# Patient Record
Sex: Female | Born: 1950
Health system: Southern US, Community
[De-identification: ages and names within clinical notes are randomized; demographics above are authoritative.]

## PROBLEM LIST (undated history)

## (undated) DIAGNOSIS — E7800 Pure hypercholesterolemia, unspecified: Secondary | ICD-10-CM

## (undated) DIAGNOSIS — I10 Essential (primary) hypertension: Secondary | ICD-10-CM

## (undated) DIAGNOSIS — M1990 Unspecified osteoarthritis, unspecified site: Secondary | ICD-10-CM

## (undated) DIAGNOSIS — R011 Cardiac murmur, unspecified: Secondary | ICD-10-CM

## (undated) HISTORY — DX: Cardiac murmur, unspecified: R01.1

## (undated) HISTORY — PX: TUBAL LIGATION: SHX77

## (undated) HISTORY — DX: Essential (primary) hypertension: I10

## (undated) HISTORY — PX: CARPAL TUNNEL RELEASE: SHX101

---

## 2011-03-20 ENCOUNTER — Ambulatory Visit (INDEPENDENT_AMBULATORY_CARE_PROVIDER_SITE_OTHER): Payer: BC Managed Care – PPO | Admitting: Family Medicine

## 2011-03-20 ENCOUNTER — Encounter: Payer: Self-pay | Admitting: Family Medicine

## 2011-03-20 VITALS — BP 170/96 | HR 73 | Resp 16 | Ht 66.0 in | Wt 202.1 lb

## 2011-03-20 DIAGNOSIS — N938 Other specified abnormal uterine and vaginal bleeding: Secondary | ICD-10-CM

## 2011-03-20 DIAGNOSIS — N939 Abnormal uterine and vaginal bleeding, unspecified: Secondary | ICD-10-CM

## 2011-03-20 DIAGNOSIS — I10 Essential (primary) hypertension: Secondary | ICD-10-CM

## 2011-03-20 DIAGNOSIS — N926 Irregular menstruation, unspecified: Secondary | ICD-10-CM

## 2011-03-20 DIAGNOSIS — E669 Obesity, unspecified: Secondary | ICD-10-CM

## 2011-03-20 MED ORDER — HYDROCHLOROTHIAZIDE 25 MG PO TABS: 25.0000 mg | ORAL_TABLET | Freq: Every day | ORAL | Status: DC

## 2011-03-20 NOTE — Patient Instructions (Signed)
It was nice to meet you! Start the blood pressure pill, take once a day  Get the blood work done- do not eat after midnight- we will go over the blood work at your physical Schedule a physical within the next 4 weeks- please give her 8:30 am slot.

## 2011-03-20 NOTE — Assessment & Plan Note (Addendum)
Start HCTZ daily. Obtain metabolic panel, lipid panel, CBC, TSH

## 2011-03-20 NOTE — Assessment & Plan Note (Signed)
Will have patient schedule physical for Pap smear. Cultures were done at this visit as well. Ultrasound will be obtained she will be referred to GYN for further workup for postmenopausal bleeding

## 2011-03-20 NOTE — Progress Notes (Signed)
  Subjective:    Patient ID: Robin Villa, female    DOB: 04-21-1950, 61 y.o.   MRN: [id number]  HPI  Patient here to establish care. She has not seen a primary care provider in greater than 5 years. Her previous PCP was Dr. Mirna Mires.  Hypertension- patient has a history of hypertension. She was on medications in the past. Has not been on medications for a few years. She takes her blood pressure at home with an automated cuff. Her blood pressure one 130 systolic over 80s diastolic. She denies headache, chest pain. She admits to leg edema typically after work.  Vaginal bleeding- patient is noting spotting for the past 4-5 months. She stopped her menses at age 66 after her last child. She denies any vaginal discharge, pelvic or abdominal pain. She denies dysuria. She's not had a Pap smear in greater than 5 years. She has not had a mammogram  She has not had a colonoscopy She's not had any blood work done.  She works for [employer] system.  Review of Systems    GEN- denies fatigue, fever, weight loss,weakness, recent illness HEENT- denies eye drainage, change in vision, nasal discharge, CVS- denies chest pain, palpitations, +leg swelling  RESP- denies SOB, cough, wheeze ABD- denies N/V, change in stools, abd pain GU- denies dysuria, hematuria, dribbling, incontinence MSK- denies joint pain, muscle aches, injury Neuro- denies headache, dizziness, syncope, seizure activity       Objective:   Physical Exam GEN- NAD, alert and oriented x3 HEENT- PERRL, EOMI, non injected sclera, pink conjunctiva, MMM, oropharynx clear Neck- Supple, no thyromegaly,no bruit CVS- RRR, no murmur RESP-CTAB ABD-NABS,soft, NT ND EXT- Trace pedal edema Pulses- Radial, DP- 2+        Assessment & Plan:

## 2011-03-20 NOTE — Assessment & Plan Note (Signed)
Discussed importance of exercise. We'll continue to counsel patient on this

## 2011-04-04 LAB — COMPREHENSIVE METABOLIC PANEL
AST: 25 U/L (ref 0–37)
Alkaline Phosphatase: 67 U/L (ref 39–117)
Glucose, Bld: 95 mg/dL (ref 70–99)
Potassium: 4.7 mEq/L (ref 3.5–5.3)
Sodium: 141 mEq/L (ref 135–145)
Total Bilirubin: 0.3 mg/dL (ref 0.3–1.2)
Total Protein: 7.4 g/dL (ref 6.0–8.3)

## 2011-04-04 LAB — CBC
Hemoglobin: 12.2 g/dL (ref 12.0–15.0)
MCH: 28 pg (ref 26.0–34.0)
MCHC: 30.6 g/dL (ref 30.0–36.0)
MCV: 91.5 fL (ref 78.0–100.0)
Platelets: 247 10*3/uL (ref 150–400)
RBC: 4.36 MIL/uL (ref 3.87–5.11)

## 2011-04-04 LAB — LIPID PANEL
Total CHOL/HDL Ratio: 5.9 Ratio
VLDL: 24 mg/dL (ref 0–40)

## 2011-04-04 LAB — TSH: TSH: 1.227 u[IU]/mL (ref 0.350–4.500)

## 2011-04-10 ENCOUNTER — Encounter: Payer: Self-pay | Admitting: Family Medicine

## 2011-04-10 ENCOUNTER — Other Ambulatory Visit (HOSPITAL_COMMUNITY)
Admission: RE | Admit: 2011-04-10 | Discharge: 2011-04-10 | Disposition: A | Discharge status: Home / Self Care | Payer: BC Managed Care – PPO | Source: Ambulatory Visit | Attending: Family Medicine | Admitting: Family Medicine

## 2011-04-10 ENCOUNTER — Ambulatory Visit (INDEPENDENT_AMBULATORY_CARE_PROVIDER_SITE_OTHER): Payer: BC Managed Care – PPO | Admitting: Family Medicine

## 2011-04-10 VITALS — BP 150/84 | HR 65 | Resp 18 | Ht 66.0 in | Wt 199.0 lb

## 2011-04-10 DIAGNOSIS — N939 Abnormal uterine and vaginal bleeding, unspecified: Secondary | ICD-10-CM

## 2011-04-10 DIAGNOSIS — N938 Other specified abnormal uterine and vaginal bleeding: Secondary | ICD-10-CM

## 2011-04-10 DIAGNOSIS — Z01419 Encounter for gynecological examination (general) (routine) without abnormal findings: Secondary | ICD-10-CM

## 2011-04-10 DIAGNOSIS — I10 Essential (primary) hypertension: Secondary | ICD-10-CM

## 2011-04-10 DIAGNOSIS — N926 Irregular menstruation, unspecified: Secondary | ICD-10-CM

## 2011-04-10 LAB — POC HEMOCCULT BLD/STL (OFFICE/1-CARD/DIAGNOSTIC): Fecal Occult Blood, POC: NEGATIVE

## 2011-04-10 NOTE — Assessment & Plan Note (Signed)
Will send for work-up of DUB in post menopausal patient Cultures obtained, PAP Smear done Mammogram set up for [facility] Reviewed labs

## 2011-04-10 NOTE — Assessment & Plan Note (Signed)
Blood pressure much improved, will continue HCTZ

## 2011-04-10 NOTE — Progress Notes (Signed)
  Subjective:    Patient ID: Robin Villa, female    DOB: 1950-05-01, 61 y.o.   MRN: [id number]  HPI  Patient here for GYN exam. She continues to have random vaginal spotting. Denies vaginal discharge. She's been postmenopausal for greater than 20 years.   Colonoscopy Due- pt wants to wait until summer TDAP due today Declined flu   Review of Systems      GEN- denies fatigue, fever, weight loss,weakness, recent illness HEENT- denies eye drainage, change in vision, nasal discharge, CVS- denies chest pain, palpitations, +leg swelling  RESP- denies SOB, cough, wheeze ABD- denies N/V, change in stools, abd pain GU- denies dysuria, hematuria, dribbling, incontinence MSK- denies joint pain, muscle aches, injury Neuro- denies headache, dizziness, syncope, seizure activity    Objective:   Physical Exam GEN- NAD, alert and oriented,overweght Breast- normal symmetry, no nipple inversion,no nipple drainage, no nodules or lumps felt Nodes- no axillary nodes GU- normal external genitalia, vaginal mucosa pink and moist, cervix visualized no growth, + blood form os, friable cervix, no  discharge, no CMT, no ovarian masses, uterus normal size Rectal- small skin tag, soft brown stool, normal Tone FOBT- Negative        Assessment & Plan:

## 2011-04-10 NOTE — Patient Instructions (Signed)
You will be referred to GYN for work-up We will call with labs results or send a letter Continue your blood pressure medication I recommend Calcium (1200mg ) and Vit D (800IU) daily 30 minutes of exercise 5 days a week Dentist visit every 6 months Eye doctor visit once a year with high blood pressure F/U 3 months

## 2011-04-11 ENCOUNTER — Telehealth: Payer: Self-pay | Admitting: Family Medicine

## 2011-04-11 LAB — WET PREP BY MOLECULAR PROBE
Candida species: NEGATIVE
Trichomonas vaginosis: POSITIVE — AB

## 2011-04-11 LAB — GC/CHLAMYDIA PROBE AMP, GENITAL: GC Probe Amp, Genital: NEGATIVE

## 2011-04-11 MED ORDER — METRONIDAZOLE 500 MG PO TABS: ORAL_TABLET | ORAL | Status: DC

## 2011-04-11 NOTE — Telephone Encounter (Signed)
LVM Pt  Has trichomonas infection noted on Wet Prep, she is married. She needs to take Flagyl 2000mg  x 1 dose to clear the infection. It is possible the infection has been around for a while, there is no way to tell who she was infected by. Her husband should also be treated. He can go to the health department

## 2011-04-12 NOTE — Telephone Encounter (Signed)
Patient is aware and I mailed her some information on the infection

## 2011-04-14 ENCOUNTER — Ambulatory Visit (HOSPITAL_COMMUNITY): Payer: BC Managed Care – PPO

## 2011-07-11 ENCOUNTER — Ambulatory Visit: Payer: BC Managed Care – PPO | Admitting: Family Medicine

## 2011-07-12 ENCOUNTER — Encounter: Payer: Self-pay | Admitting: Family Medicine

## 2011-07-12 ENCOUNTER — Ambulatory Visit (INDEPENDENT_AMBULATORY_CARE_PROVIDER_SITE_OTHER): Payer: BC Managed Care – PPO | Admitting: Family Medicine

## 2011-07-12 VITALS — BP 160/90 | HR 86 | Resp 16 | Ht 66.0 in | Wt 197.0 lb

## 2011-07-12 DIAGNOSIS — Z1211 Encounter for screening for malignant neoplasm of colon: Secondary | ICD-10-CM

## 2011-07-12 DIAGNOSIS — N925 Other specified irregular menstruation: Secondary | ICD-10-CM

## 2011-07-12 DIAGNOSIS — I10 Essential (primary) hypertension: Secondary | ICD-10-CM

## 2011-07-12 DIAGNOSIS — N938 Other specified abnormal uterine and vaginal bleeding: Secondary | ICD-10-CM

## 2011-07-12 DIAGNOSIS — E669 Obesity, unspecified: Secondary | ICD-10-CM

## 2011-07-12 DIAGNOSIS — N949 Unspecified condition associated with female genital organs and menstrual cycle: Secondary | ICD-10-CM

## 2011-07-12 MED ORDER — LISINOPRIL 20 MG PO TABS: 20.0000 mg | ORAL_TABLET | Freq: Every day | ORAL | Status: DC

## 2011-07-12 NOTE — Progress Notes (Signed)
  Subjective:    Patient ID: Robin Villa, female    DOB: [birth year], 61 y.o.   MRN: [id number]  HPI Pt here to f/u chronic medical problems, no concerns Medication and history reviewed Needs colonoscopy set up No recent vaginal bleeding after trichomonas treatment, ultrasound never done    Review of Systems   GEN- denies fatigue, fever, weight loss,weakness, recent illness HEENT- denies eye drainage, change in vision, nasal discharge, CVS- denies chest pain, palpitations RESP- denies SOB, cough, wheeze ABD- denies N/V, change in stools, abd pain GU- denies dysuria, hematuria, dribbling, incontinence MSK- denies joint pain, muscle aches, injury Neuro- denies headache, dizziness, syncope, seizure activity      Objective:   Physical Exam GEN- NAD, alert and oriented x3 HEENT- PERRL, EOMI, non injected sclera, pink conjunctiva, MMM, oropharynx clear Neck- Supple, no thyrmegaly, no bruit CVS- RRR, no murmur RESP-CTAB ABD-NABS,soft, NT,ND EXT-trace pedal edema Pulses- Radial, DP- 2+        Assessment & Plan:

## 2011-07-12 NOTE — Patient Instructions (Addendum)
We will set up colonoscopy We will set up the ultrasound for the bleeding Start new blood pressure medicine lisinopril - once a day  Continue your HCTZ F/U 4 weeks

## 2011-07-13 ENCOUNTER — Encounter: Payer: Self-pay | Admitting: Family Medicine

## 2011-07-13 ENCOUNTER — Telehealth: Payer: Self-pay

## 2011-07-13 NOTE — Progress Notes (Signed)
Patient ID: Robin Villa, female   DOB: [birth year], 61 y.o.   MRN: [id number] Patient has appt at [facility] [place] on 07/17/2011 9:45. Patient is aware of this. But states she can't do this right now. It will have to wait until she goes back to work. Thanks, [name] My staff his call patient to schedule appointment she spoke with [name] today she did not want to have the ultrasound done she was not working. This is documented and she was advised on the visit that she needs the ultrasound for the postmenopausal bleeding which could be a sign of cancer

## 2011-07-13 NOTE — Assessment & Plan Note (Signed)
encouraged to continue walking and watch her diet

## 2011-07-13 NOTE — Assessment & Plan Note (Signed)
No recent bleeding, treated for STD, concern for bleed in post-menopausal pt therefore I feel ultrasound should be done, pt agrees to this but will hold off on GYN unless abnormal ultrasound

## 2011-07-13 NOTE — Assessment & Plan Note (Addendum)
Uncontrolled add lisinopril, recheck 4 weeks

## 2011-07-13 NOTE — Telephone Encounter (Signed)
Called, line busy.  

## 2011-07-14 NOTE — Telephone Encounter (Signed)
Called. Line busy again. Mailed letter to call.

## 2011-07-17 ENCOUNTER — Ambulatory Visit (HOSPITAL_COMMUNITY): Payer: BC Managed Care – PPO

## 2011-08-08 ENCOUNTER — Telehealth: Payer: Self-pay | Admitting: Urgent Care

## 2011-08-08 NOTE — Telephone Encounter (Signed)
Called pt to triage for colonoscopy. She has problem with hemorrhoids. OV scheduled for 08/23/2011 with KJ at 8:00 AM.

## 2011-08-08 NOTE — Telephone Encounter (Signed)
Pt received a letter from nurse regarding setting up tcs. You can reach her at [phone]

## 2011-08-09 ENCOUNTER — Ambulatory Visit (INDEPENDENT_AMBULATORY_CARE_PROVIDER_SITE_OTHER): Payer: BC Managed Care – PPO | Admitting: Family Medicine

## 2011-08-09 ENCOUNTER — Encounter: Payer: Self-pay | Admitting: Family Medicine

## 2011-08-09 VITALS — BP 138/90 | HR 86 | Resp 16 | Ht 66.0 in | Wt 198.4 lb

## 2011-08-09 DIAGNOSIS — I10 Essential (primary) hypertension: Secondary | ICD-10-CM

## 2011-08-09 MED ORDER — LISINOPRIL-HYDROCHLOROTHIAZIDE 20-25 MG PO TABS: 1.0000 | ORAL_TABLET | Freq: Every day | ORAL | Status: DC

## 2011-08-09 NOTE — Patient Instructions (Signed)
Start the combination blood pressure pill ( HCTZ-Lisinopril)  Pick up Calcium and Vit D -once a day  F/U 3 months

## 2011-08-09 NOTE — Progress Notes (Signed)
  Subjective:    Patient ID: Robin Villa, female    DOB: 02/25/1950, 61 y.o.   MRN: [id number]  HPI Pt here to f/u HTN doing well, had cough for 2 days starting lisinopril now resolved, no concerns Set up to have Mammogram and Colonoscopy Declines vaginal ultrasound    Review of Systems  GEN- denies fatigue, fever, weight loss,weakness, recent illness HEENT- denies eye drainage, change in vision, nasal discharge, CVS- denies chest pain, palpitations RESP- denies SOB, cough, wheeze ABD- denies N/V, change in stools, abd pain GU- denies dysuria, hematuria, dribbling, incontinence MSK- denies joint pain, muscle aches, injury Neuro- denies headache, dizziness, syncope, seizure activity      Objective:   Physical Exam GEN- NAD, alert and oriented x3 CVS- RRR, no murmur RESP-CTAB EXT-Trace  edema Pulses- Radial, DP- 2+        Assessment & Plan:

## 2011-08-09 NOTE — Assessment & Plan Note (Signed)
BP much improved on Lisinopril HCTZ, continue with combination pill

## 2011-08-23 ENCOUNTER — Encounter: Payer: Self-pay | Admitting: Urgent Care

## 2011-08-23 ENCOUNTER — Ambulatory Visit (INDEPENDENT_AMBULATORY_CARE_PROVIDER_SITE_OTHER): Payer: BC Managed Care – PPO | Admitting: Urgent Care

## 2011-08-23 VITALS — BP 122/65 | HR 55 | Temp 97.2°F | Ht 65.0 in | Wt 201.0 lb

## 2011-08-23 DIAGNOSIS — K649 Unspecified hemorrhoids: Secondary | ICD-10-CM

## 2011-08-23 DIAGNOSIS — Z1211 Encounter for screening for malignant neoplasm of colon: Secondary | ICD-10-CM

## 2011-08-23 MED ORDER — HYDROCORTISONE ACETATE 25 MG RE SUPP: 25.0000 mg | Freq: Two times a day (BID) | RECTAL | Status: AC

## 2011-08-23 NOTE — Patient Instructions (Addendum)
Colonoscopy w/ Dr Val Riles [facility] suppositories twice daily for 10 days Hemorrhoids Hemorrhoids are enlarged (dilated) veins around the rectum. There are 2 types of hemorrhoids, and the type of hemorrhoid is determined by its location. Internal hemorrhoids occur in the veins just inside the rectum.They are usually not painful, but they may bleed.However, they may poke through to the outside and become irritated and painful. External hemorrhoids involve the veins outside the anus and can be felt as a painful swelling or hard lump near the anus.They are often itchy and may crack and bleed. Sometimes clots will form in the veins. This makes them swollen and painful. These are called thrombosed hemorrhoids. CAUSES Causes of hemorrhoids include:  Pregnancy. This increases the pressure in the hemorrhoidal veins.   Constipation.   Straining to have a bowel movement.   Obesity.   Heavy lifting or other activity that caused you to strain.  TREATMENT Most of the time hemorrhoids improve in 1 to 2 weeks. However, if symptoms do not seem to be getting better or if you have a lot of rectal bleeding, your caregiver may perform a procedure to help make the hemorrhoids get smaller or remove them completely.Possible treatments include:  Rubber band ligation. A rubber band is placed at the base of the hemorrhoid to cut off the circulation.   Sclerotherapy. A chemical is injected to shrink the hemorrhoid.   Infrared light therapy. Tools are used to burn the hemorrhoid.   Hemorrhoidectomy. This is surgical removal of the hemorrhoid.  HOME CARE INSTRUCTIONS   Increase fiber in your diet. Ask your caregiver about using fiber supplements.   Drink enough water and fluids to keep your urine clear or pale yellow.   Exercise regularly.   Go to the bathroom when you have the urge to have a bowel movement. Do not wait.   Avoid straining to have bowel movements.   Keep the anal area dry and clean.    Only take over-the-counter or prescription medicines for pain, discomfort, or fever as directed by your caregiver.  If your hemorrhoids are thrombosed:  Take warm sitz baths for 20 to 30 minutes, 3 to 4 times per day.   If the hemorrhoids are very tender and swollen, place ice packs on the area as tolerated. Using ice packs between sitz baths may be helpful. Fill a plastic bag with ice. Place a towel between the bag of ice and your skin.   Medicated creams and suppositories may be used or applied as directed.   Do not use a donut-shaped pillow or sit on the toilet for long periods. This increases blood pooling and pain.  SEEK MEDICAL CARE IF:   You have increasing pain and swelling that is not controlled with your medicine.   You have uncontrolled bleeding.   You have difficulty or you are unable to have a bowel movement.   You have pain or inflammation outside the area of the hemorrhoids.   You have chills or an oral temperature above 102 F (38.9 C).  MAKE SURE YOU:   Understand these instructions.   Will watch your condition.   Will get help right away if you are not doing well or get worse.  Document Released: 01/14/2000 Document Revised: 01/05/2011 Document Reviewed: 05/21/2007 [facility] Patient Information 2012 [place], [place].

## 2011-08-23 NOTE — Progress Notes (Signed)
Referring Provider: Salley Scarlet, MD Primary Care Physician:  Milinda Antis, MD Primary Gastroenterologist:  Dr. Jonette Crytal  Chief Complaint  Patient presents with  . Colonoscopy    ?hemorrhoids    HPI:  Robin Villa is a 61 y.o. female here as a referral from Dr. Jeanice Lim for screening colonoscopy.  She was brought in for an office visit prior to her colonoscopy as she has concerns with hemorrhoids.  She has had proctalgia & anal pruritis.   Tried prep H without relief.  Never had colonoscopy.  Denies any lower GI symptoms including constipation, diarrhea, rectal bleeding, melena or weight loss. Denies any upper GI symptoms including heartburn, indigestion, nausea, vomiting, dysphagia, odynophagia or anorexia. Past Medical History  Diagnosis Date  . Hypertension   . Heart murmur     asymptomatic    Past Surgical History  Procedure Date  . Tubal ligation   . Carpal tunnel release     bilateral     Current Outpatient Prescriptions  Medication Sig Dispense Refill  . lisinopril-hydrochlorothiazide (PRINZIDE,ZESTORETIC) 20-25 MG per tablet Take 1 tablet by mouth daily.  30 tablet  3    Allergies as of 08/23/2011  . (No Known Allergies)    Family History:There is no known family history of colorectal carcinoma , liver disease, or inflammatory bowel disease.  Problem Relation Age of Onset  . Heart disease Father   . Stroke Father   . Hypertension Sister   . Hypertension Sister   . Heart disease Brother   . Hypertension Mother     History   Social History  . Marital Status: Unknown    Spouse Name: N/A    Number of Children: 3  . Years of Education: N/A   Occupational History  . retired, Public affairs consultant    Social History Main Topics  . Smoking status: Former Smoker -- 0.3 packs/day for 35 years    Types: Cigarettes    Quit date: 08/22/1996  . Smokeless tobacco: Not on file  . Alcohol Use: No  . Drug Use: No  . Sexually Active: Not on file  Review of  Systems: Gen: Denies any fever, chills, sweats, anorexia, fatigue, weakness, malaise, weight loss, and sleep disorder CV: Denies chest pain, angina, palpitations, syncope, orthopnea, PND, peripheral edema, and claudication. Resp: Denies dyspnea at rest, dyspnea with exercise, cough, sputum, wheezing, coughing up blood, and pleurisy. GI: Denies vomiting blood, jaundice, and fecal incontinence.   Denies dysphagia or odynophagia. GU : Denies urinary burning, blood in urine, urinary frequency, urinary hesitancy, nocturnal urination, and urinary incontinence. MS: Denies joint pain, limitation of movement, and swelling, stiffness, low back pain, extremity pain. Denies muscle weakness, cramps, atrophy.  Derm: Denies rash, itching, dry skin, hives, moles, warts, or unhealing ulcers.  Psych: Denies depression, anxiety, memory loss, suicidal ideation, hallucinations, paranoia, and confusion. Heme: Denies bruising, bleeding, and enlarged lymph nodes. Neuro:  Denies any headaches, dizziness, paresthesias. Endo:  Denies any problems with DM, thyroid, adrenal function.  Physical Exam: BP 122/65  Pulse 55  Temp 97.2 F (36.2 C) (Temporal)  Ht 5\' 5"  (1.651 m)  Wt 201 lb (91.173 kg)  BMI 33.45 kg/m2 General:   Alert,  Well-developed, obese, pleasant and cooperative in NAD Head:  Normocephalic and atraumatic. Eyes:  Sclera clear, no icterus.   Conjunctiva pink. Ears:  Normal auditory acuity. Nose:  No deformity, discharge, or lesions. Mouth:  No deformity or lesions,oropharynx pink & moist. Neck:  Supple; no masses or thyromegaly. Lungs:  Clear throughout to auscultation.   No wheezes, crackles, or rhonchi. No acute distress. Heart:  Regular rate and rhythm; no murmurs, clicks, rubs,  or gallops. Abdomen:  Normal bowel sounds.  No bruits.  Soft, non-tender and non-distended without masses, hepatosplenomegaly or hernias noted.  No guarding or rebound tenderness.   Rectal:  Deferred until  colonoscopy. Msk:  Symmetrical without gross deformities. Normal posture. Pulses:  Normal pulses noted. Extremities:  No clubbing or edema. Neurologic:  Alert and oriented x4;  grossly normal neurologically. Skin:  Intact without significant lesions or rashes. Lymph Nodes:  No significant cervical adenopathy. Psych:  Alert and cooperative. Normal mood and affect.

## 2011-08-23 NOTE — Progress Notes (Signed)
Faxed to PCP, Dr Angier 

## 2011-08-23 NOTE — Assessment & Plan Note (Signed)
Robin Villa is a pleasant 61 y.o. female due for average risk screening colonoscopy who believes she has hemorrhoids due to anal pruritis & proctalgia.  Colonoscopy w/ Dr Darrick Penna for further evaluation.  Symptomatic hemorrhoid treatment.  Hemorrhoid literature Anusol HC supp PR BID

## 2011-09-05 ENCOUNTER — Encounter (HOSPITAL_COMMUNITY): Payer: Self-pay | Admitting: Pharmacy Technician

## 2011-09-15 ENCOUNTER — Encounter (HOSPITAL_COMMUNITY)
Admission: RE | Disposition: A | Discharge status: Home / Self Care | Payer: Self-pay | Source: Ambulatory Visit | Attending: Gastroenterology

## 2011-09-15 ENCOUNTER — Encounter (HOSPITAL_COMMUNITY): Payer: Self-pay

## 2011-09-15 ENCOUNTER — Ambulatory Visit (HOSPITAL_COMMUNITY)
Admission: RE | Admit: 2011-09-15 | Discharge: 2011-09-15 | Disposition: A | Discharge status: Home / Self Care | Payer: BC Managed Care – PPO | Source: Ambulatory Visit | Attending: Gastroenterology | Admitting: Gastroenterology

## 2011-09-15 DIAGNOSIS — Z1211 Encounter for screening for malignant neoplasm of colon: Secondary | ICD-10-CM

## 2011-09-15 DIAGNOSIS — K649 Unspecified hemorrhoids: Secondary | ICD-10-CM

## 2011-09-15 DIAGNOSIS — K5730 Diverticulosis of large intestine without perforation or abscess without bleeding: Secondary | ICD-10-CM

## 2011-09-15 DIAGNOSIS — Z79899 Other long term (current) drug therapy: Secondary | ICD-10-CM | POA: Insufficient documentation

## 2011-09-15 DIAGNOSIS — I10 Essential (primary) hypertension: Secondary | ICD-10-CM | POA: Insufficient documentation

## 2011-09-15 DIAGNOSIS — D126 Benign neoplasm of colon, unspecified: Secondary | ICD-10-CM | POA: Insufficient documentation

## 2011-09-15 DIAGNOSIS — K648 Other hemorrhoids: Secondary | ICD-10-CM

## 2011-09-15 HISTORY — PX: COLONOSCOPY: SHX5424

## 2011-09-15 SURGERY — COLONOSCOPY
Anesthesia: Moderate Sedation

## 2011-09-15 MED ORDER — STERILE WATER FOR IRRIGATION IR SOLN
Status: DC | PRN
  Administered 2011-09-15: 11:00:00

## 2011-09-15 MED ORDER — MIDAZOLAM HCL 5 MG/5ML IJ SOLN
INTRAMUSCULAR | Status: AC
  Filled 2011-09-15: qty 10

## 2011-09-15 MED ORDER — MIDAZOLAM HCL 5 MG/5ML IJ SOLN
INTRAMUSCULAR | Status: DC | PRN
  Administered 2011-09-15 (×2): 2 mg via INTRAVENOUS

## 2011-09-15 MED ORDER — SODIUM CHLORIDE 0.45 % IV SOLN
Freq: Once | INTRAVENOUS | Status: AC
  Administered 2011-09-15: 09:00:00 via INTRAVENOUS

## 2011-09-15 MED ORDER — MEPERIDINE HCL 100 MG/ML IJ SOLN
INTRAMUSCULAR | Status: DC | PRN
  Administered 2011-09-15: 25 mg via INTRAVENOUS
  Administered 2011-09-15: 50 mg via INTRAVENOUS

## 2011-09-15 MED ORDER — MEPERIDINE HCL 100 MG/ML IJ SOLN
INTRAMUSCULAR | Status: AC
  Filled 2011-09-15: qty 1

## 2011-09-15 NOTE — Op Note (Signed)
[facility] [address] [place], [place]  [address]  COLONOSCOPY PROCEDURE REPORT  PATIENT:  Robin Villa, Robin Villa  MR#:  [id number] BIRTHDATE:  08/22/1950, 61 yrs. old  GENDER:  female  ENDOSCOPIST:  Jonette Brei, MD REF. BY:  Milinda Antis, M.D. ASSISTANT:  PROCEDURE DATE:  09/15/2011 PROCEDURE:  Colonoscopy with biopsy and snare polypectomy  INDICATIONS:  screening  MEDICATIONS:   Demerol 75 mg IV, Versed 4 mg IV  DESCRIPTION OF PROCEDURE:    Physical exam was performed. Informed consent was obtained from the patient after explaining the benefits, risks, and alternatives to procedure.  The patient was connected to monitor and placed in left lateral position. Continuous oxygen was provided by nasal cannula and IV medicine administered through an indwelling cannula.  After administration of sedation and rectal exam, the patient's rectum was intubated and the EC-[id number] ([id number]) colonoscope was advanced under direct visualization to the cecum.  The scope was removed slowly by carefully examining the color, texture, anatomy, and integrity mucosa on the way out.  The patient was recovered in endoscopy and discharged home in satisfactory condition. <<PROCEDUREIMAGES>>  FINDINGS:  There were TWO 3 MM SESSILE polyps identified and removed. in the ascending colon.  There were TWO 3 MM SESSILE polyps identified and removed. in the descending colon.  There were TWO 3-8 MM SESSILE polyps identified and removed. in the sigmoid colon(CF/Flippin). ALL POLYPS REMOVED VIA COLD FORCEPS/SNARE CAUTERY. FREQUENT Diverticula were found throughout the colon.  MODERATE Internal Hemorrhoids were found.  PREP QUALITY:      GOOD CECAL W/D TIME:      26 minutes  COMPLICATIONS:    None  ENDOSCOPIC IMPRESSION: 1) Polyps, multiple in the ascending colon 2) Polyps, multiple in the descending colon 3) Polyps, multiple in the sigmoid colon 4) MODERATE DiverticulOSIS throughout the colon 5) Internal  hemorrhoids  RECOMMENDATIONS: AWAIT BIOPSY HIGH FIBER DIET PREP H PRN HEMORRHOID PAIN/BLEEDING. CONSIDER HEMORRHOID BANDNG IF SX UNCONTROLLED.  TCS IN 10 YEARS WITH CLEAR LIQUID 36 HRS PRIOR TO PROCEDURE./  REPEAT EXAM:  No  ______________________________ Jonette Sudie, MD  CC:  Milinda Antis, M.D.  n. eSIGNED:   Elic Vencill at 09/15/2011 11:39 AM  Fernanda Drum, [phone]

## 2011-09-15 NOTE — H&P (Signed)
  Primary Care Physician:  Milinda Antis, MD Primary Gastroenterologist:  Dr. Darrick Penna  Pre-Procedure History & Physical: HPI:  Robin Villa is a 61 y.o. female here for COLON CANCER SCREENING.   Past Medical History  Diagnosis Date  . Hypertension   . Heart murmur     asymptomatic    Past Surgical History  Procedure Date  . Tubal ligation   . Carpal tunnel release     bilateral     Prior to Admission medications   Medication Sig Start Date End Date Taking? Authorizing Provider  ibuprofen (ADVIL,MOTRIN) 200 MG tablet Take 800 mg by mouth every 6 (six) hours as needed. For pain   Yes Historical Provider, MD  lisinopril-hydrochlorothiazide (PRINZIDE,ZESTORETIC) 20-25 MG per tablet Take 1 tablet by mouth daily. 08/09/11 08/08/12 Yes Salley Scarlet, MD    Allergies as of 08/23/2011  . (No Known Allergies)    Family History  Problem Relation Age of Onset  . Heart disease Father   . Stroke Father   . Hypertension Sister   . Hypertension Sister   . Heart disease Brother   . Hypertension Mother     History   Social History  . Marital Status: Married    Spouse Name: N/A    Number of Children: 3  . Years of Education: N/A   Occupational History  . retired, Public affairs consultant    Social History Main Topics  . Smoking status: Former Smoker -- 0.3 packs/day for 35 years    Types: Cigarettes    Quit date: 08/22/1996  . Smokeless tobacco: Not on file  . Alcohol Use: No  . Drug Use: No  . Sexually Active: Not on file   Other Topics Concern  . Not on file   Social History Narrative  . No narrative on file    Review of Systems: See HPI, otherwise negative ROS   Physical Exam: BP 156/79  Pulse 62  Temp 98.7 F (37.1 C) (Oral)  Resp 13  SpO2 99% General:   Alert,  pleasant and cooperative in NAD Head:  Normocephalic and atraumatic. Neck:  Supple;  Lungs:  Clear throughout to auscultation.    Heart:  Regular rate and rhythm. Abdomen:  Soft, nontender and  nondistended. Normal bowel sounds, without guarding, and without rebound.   Neurologic:  Alert and  oriented x4;  grossly normal neurologically.  Impression/Plan:     SCREENING  Plan:  1. TCS TODAY

## 2011-09-20 ENCOUNTER — Telehealth: Payer: Self-pay | Admitting: Gastroenterology

## 2011-09-20 ENCOUNTER — Encounter: Payer: Self-pay | Admitting: Gastroenterology

## 2011-09-20 NOTE — Telephone Encounter (Signed)
error 

## 2011-09-20 NOTE — Progress Notes (Signed)
Hyperplastic polyps TCS AUG 2013  REVIEWED.

## 2011-09-20 NOTE — Telephone Encounter (Signed)
Please call pt. She had HYPERPLASTIC POLYPS removed from her colon. TCS in 10 years. High fiber diet.  

## 2011-09-21 ENCOUNTER — Encounter (HOSPITAL_COMMUNITY): Payer: Self-pay | Admitting: Gastroenterology

## 2011-09-21 NOTE — Telephone Encounter (Signed)
LMOM to call.

## 2011-09-21 NOTE — Telephone Encounter (Signed)
Pt aware of results 

## 2011-09-21 NOTE — Telephone Encounter (Signed)
Recall made 

## 2011-11-09 ENCOUNTER — Ambulatory Visit: Payer: BC Managed Care – PPO | Admitting: Family Medicine

## 2011-12-04 ENCOUNTER — Ambulatory Visit (INDEPENDENT_AMBULATORY_CARE_PROVIDER_SITE_OTHER): Payer: BC Managed Care – PPO | Admitting: Family Medicine

## 2011-12-04 ENCOUNTER — Encounter: Payer: Self-pay | Admitting: Family Medicine

## 2011-12-04 VITALS — BP 136/72 | HR 56 | Resp 18 | Ht 66.0 in | Wt 197.1 lb

## 2011-12-04 DIAGNOSIS — E785 Hyperlipidemia, unspecified: Secondary | ICD-10-CM

## 2011-12-04 DIAGNOSIS — I10 Essential (primary) hypertension: Secondary | ICD-10-CM

## 2011-12-04 DIAGNOSIS — E669 Obesity, unspecified: Secondary | ICD-10-CM

## 2011-12-04 LAB — BASIC METABOLIC PANEL
BUN: 23 mg/dL (ref 6–23)
Calcium: 9.7 mg/dL (ref 8.4–10.5)
Creat: 1.25 mg/dL — ABNORMAL HIGH (ref 0.50–1.10)
Glucose, Bld: 97 mg/dL (ref 70–99)

## 2011-12-04 LAB — LIPID PANEL: Cholesterol: 204 mg/dL — ABNORMAL HIGH (ref 0–200)

## 2011-12-04 MED ORDER — LISINOPRIL-HYDROCHLOROTHIAZIDE 20-25 MG PO TABS: 1.0000 | ORAL_TABLET | Freq: Every day | ORAL | Status: DC

## 2011-12-04 NOTE — Assessment & Plan Note (Signed)
Well controlled, no change, BMET today

## 2011-12-04 NOTE — Progress Notes (Signed)
  Subjective:    Patient ID: Robin Villa, female    DOB: Jul 22, 1950, 61 y.o.   MRN: [id number]  HPI  Patient here to follow chronic medical problems. She has no specific concerns. She's had a mammogram by a mobile clinic in [place] states it was normal. She's had a colonoscopy. She declines flu shot.  Doing well otherwise  Review of Systems  GEN- denies fatigue, fever, weight loss,weakness, recent illness HEENT- denies eye drainage, change in vision, nasal discharge, CVS- denies chest pain, palpitations RESP- denies SOB, cough, wheeze ABD- denies N/V, change in stools, abd pain GU- denies dysuria, hematuria, dribbling, incontinence MSK- denies joint pain, muscle aches, injury Neuro- denies headache, dizziness, syncope, seizure activity      Objective:   Physical Exam GEN- NAD, alert and oriented x3 HEENT- PERRL, EOMI, non injected sclera, pink conjunctiva, MMM, oropharynx clear Neck- Supple, no carotid bruit  CVS- RRR, no murmur RESP-CTAB EXT- No edema Pulses- Radial, DP- 2+        Assessment & Plan:

## 2011-12-04 NOTE — Assessment & Plan Note (Signed)
Continues to lose weight with walking

## 2011-12-04 NOTE — Patient Instructions (Addendum)
Calcium (1000mg ) and Vit D ( 800 IU) 1 tablet twice  A day  Continue blood pressure medication Get the labs done today, we will call with results if normal you will receive a letter  F/U 6 months for blood pressure

## 2011-12-04 NOTE — Assessment & Plan Note (Signed)
Recheck FLP, pending results add statin therapy

## 2012-06-03 ENCOUNTER — Ambulatory Visit (INDEPENDENT_AMBULATORY_CARE_PROVIDER_SITE_OTHER): Payer: BC Managed Care – PPO | Admitting: Family Medicine

## 2012-06-03 ENCOUNTER — Encounter: Payer: Self-pay | Admitting: Family Medicine

## 2012-06-03 VITALS — BP 128/72 | HR 74 | Resp 18 | Ht 66.0 in | Wt 200.0 lb

## 2012-06-03 DIAGNOSIS — E785 Hyperlipidemia, unspecified: Secondary | ICD-10-CM

## 2012-06-03 DIAGNOSIS — I10 Essential (primary) hypertension: Secondary | ICD-10-CM

## 2012-06-03 DIAGNOSIS — E669 Obesity, unspecified: Secondary | ICD-10-CM

## 2012-06-03 MED ORDER — LISINOPRIL-HYDROCHLOROTHIAZIDE 20-25 MG PO TABS: 1.0000 | ORAL_TABLET | Freq: Every day | ORAL | Status: DC

## 2012-06-03 NOTE — Assessment & Plan Note (Signed)
Working on diet and exercise

## 2012-06-03 NOTE — Assessment & Plan Note (Signed)
Well controlled 

## 2012-06-03 NOTE — Patient Instructions (Addendum)
Continue current blood pressure medication Call if we can be of any assistance! F/U 6 months

## 2012-06-03 NOTE — Progress Notes (Signed)
  Subjective:    Patient ID: Robin Villa, female    DOB: Jun 23, 1950, 62 y.o.   MRN: [id number]  HPI  Patient here to followup high blood pressure. She has no specific concerns. Her husband passed away last week uncomplicated medical admission with a blood clot and heart problems. She states she is doing well and surrounded by family. She is sleeping well  Review of Systems   GEN- denies fatigue, fever, weight loss,weakness, recent illness HEENT- denies eye drainage, change in vision, nasal discharge, CVS- denies chest pain, palpitations RESP- denies SOB, cough, wheeze ABD- denies N/V, change in stools, abd pain GU- denies dysuria, hematuria, dribbling, incontinence MSK- denies joint pain, muscle aches, injury Neuro- denies headache, dizziness, syncope, seizure activity      Objective:   Physical Exam GEN- NAD, alert and oriented x3 HEENT- PERRL, EOMI, non injected sclera, pink conjunctiva, MMM, oropharynx clear CVS- RRR, no murmur RESP-CTAB EXT- No edema Pulses- Radial, DP- 2+        Assessment & Plan:

## 2012-06-03 NOTE — Assessment & Plan Note (Signed)
Unchanged, increase activity

## 2012-12-30 ENCOUNTER — Telehealth: Payer: Self-pay | Admitting: Family Medicine

## 2012-12-30 NOTE — Telephone Encounter (Signed)
Pt is needing a refill on her Blood Pills Pharmacy is [facility] in [place] Call back number [phone]

## 2012-12-30 NOTE — Telephone Encounter (Signed)
Left message with pt to return my call °

## 2012-12-31 ENCOUNTER — Other Ambulatory Visit: Payer: Self-pay | Admitting: *Deleted

## 2012-12-31 MED ORDER — LISINOPRIL-HYDROCHLOROTHIAZIDE 20-25 MG PO TABS: 1.0000 | ORAL_TABLET | Freq: Every day | ORAL | Status: DC

## 2012-12-31 NOTE — Telephone Encounter (Signed)
Med refilled; pt has appt on Dec 22 14 told her can refill for 30 days only until her appt.

## 2013-01-01 NOTE — Telephone Encounter (Signed)
meds refilled 

## 2013-01-20 ENCOUNTER — Ambulatory Visit (INDEPENDENT_AMBULATORY_CARE_PROVIDER_SITE_OTHER): Payer: BC Managed Care – PPO | Admitting: Family Medicine

## 2013-01-20 ENCOUNTER — Encounter: Payer: Self-pay | Admitting: Family Medicine

## 2013-01-20 VITALS — BP 110/72 | HR 70 | Temp 98.2°F | Resp 18 | Ht 65.0 in | Wt 202.0 lb

## 2013-01-20 DIAGNOSIS — E669 Obesity, unspecified: Secondary | ICD-10-CM

## 2013-01-20 DIAGNOSIS — Z23 Encounter for immunization: Secondary | ICD-10-CM

## 2013-01-20 DIAGNOSIS — I10 Essential (primary) hypertension: Secondary | ICD-10-CM

## 2013-01-20 DIAGNOSIS — E785 Hyperlipidemia, unspecified: Secondary | ICD-10-CM

## 2013-01-20 NOTE — Assessment & Plan Note (Signed)
Check fasting labs 

## 2013-01-20 NOTE — Progress Notes (Signed)
   Subjective:    Patient ID: Robin Villa, female    DOB: Dec 24, 1950, 62 y.o.   MRN: [id number]  HPI  Pt here to f/u HTN and hyperlipidemia, no specific concerns. Doing well on medications Due for fasting labs today. TDAP due    Review of Systems  GEN- denies fatigue, fever, weight loss,weakness, recent illness HEENT- denies eye drainage, change in vision, nasal discharge, CVS- denies chest pain, palpitations RESP- denies SOB, cough, wheeze ABD- denies N/V, change in stools, abd pain GU- denies dysuria, hematuria, dribbling, incontinence MSK- denies joint pain, muscle aches, injury Neuro- denies headache, dizziness, syncope, seizure activity       Objective:   Physical Exam GEN- NAD, alert and oriented x3 HEENT- PERRL, EOMI, non injected sclera, pink conjunctiva, MMM, oropharynx clear CVS- RRR, no murmur RESP-CTAB EXT- No edema Pulses- Radial 2+        Assessment & Plan:

## 2013-01-20 NOTE — Patient Instructions (Signed)
Blood pressure looks good We will send a letter if labs normal Tetanus Booster given  Schedule Mammogram in January PAP Smear not due until 2016 F/U  6 months

## 2013-01-20 NOTE — Assessment & Plan Note (Signed)
4 pressure well controlled continue current medication

## 2013-01-20 NOTE — Assessment & Plan Note (Signed)
Unfortunately she has not lost any weight. Short-term goals have been set

## 2013-01-21 LAB — CBC
HCT: 34.9 % — ABNORMAL LOW (ref 36.0–46.0)
Hemoglobin: 12 g/dL (ref 12.0–15.0)
MCHC: 34.4 g/dL (ref 30.0–36.0)
MCV: 86.2 fL (ref 78.0–100.0)
RDW: 13.9 % (ref 11.5–15.5)
WBC: 9 10*3/uL (ref 4.0–10.5)

## 2013-01-21 LAB — COMPREHENSIVE METABOLIC PANEL
ALT: 17 U/L (ref 0–35)
AST: 18 U/L (ref 0–37)
BUN: 24 mg/dL — ABNORMAL HIGH (ref 6–23)
Creat: 1.2 mg/dL — ABNORMAL HIGH (ref 0.50–1.10)
Total Bilirubin: 0.3 mg/dL (ref 0.3–1.2)

## 2013-01-21 LAB — LIPID PANEL
HDL: 36 mg/dL — ABNORMAL LOW (ref >39)
LDL Cholesterol: 137 mg/dL — ABNORMAL HIGH (ref 0–99)
Triglycerides: 145 mg/dL (ref <150)
VLDL: 29 mg/dL (ref 0–40)

## 2013-01-28 ENCOUNTER — Encounter: Payer: Self-pay | Admitting: *Deleted

## 2013-02-03 ENCOUNTER — Other Ambulatory Visit: Payer: Self-pay | Admitting: Family Medicine

## 2013-03-06 ENCOUNTER — Encounter: Payer: Self-pay | Admitting: Family Medicine

## 2013-04-15 ENCOUNTER — Other Ambulatory Visit: Payer: Self-pay | Admitting: Family Medicine

## 2013-04-15 DIAGNOSIS — I10 Essential (primary) hypertension: Secondary | ICD-10-CM

## 2013-04-15 NOTE — Telephone Encounter (Signed)
Medication refilled per protocol. 

## 2013-07-16 ENCOUNTER — Telehealth: Payer: Self-pay | Admitting: *Deleted

## 2013-07-16 DIAGNOSIS — I10 Essential (primary) hypertension: Secondary | ICD-10-CM

## 2013-07-16 MED ORDER — LISINOPRIL-HYDROCHLOROTHIAZIDE 20-25 MG PO TABS: ORAL_TABLET | ORAL | Status: DC

## 2013-07-16 NOTE — Telephone Encounter (Signed)
Prescription sent to pharmacy. .   Call placed to patient and patient made aware.  

## 2013-07-16 NOTE — Telephone Encounter (Signed)
Message copied by Sheral Flow on Wed Jul 16, 2013  4:39 PM ------      Message from: Devoria Glassing      Created: Wed Jul 16, 2013  3:03 PM       Patient has appointment on Friday the 26th and is going to be out of her blood pressure medication before than, would like to know if she can get a enough pills to last her so she is not going to go anytime without taking them [phone] ------

## 2013-07-21 ENCOUNTER — Ambulatory Visit: Payer: BC Managed Care – PPO | Admitting: Family Medicine

## 2013-07-25 ENCOUNTER — Encounter: Payer: Self-pay | Admitting: Family Medicine

## 2013-07-25 ENCOUNTER — Other Ambulatory Visit: Payer: Self-pay | Admitting: Family Medicine

## 2013-07-25 ENCOUNTER — Ambulatory Visit (INDEPENDENT_AMBULATORY_CARE_PROVIDER_SITE_OTHER): Payer: BC Managed Care – PPO | Admitting: Family Medicine

## 2013-07-25 VITALS — BP 136/84 | HR 64 | Temp 97.8°F | Resp 12 | Ht 65.0 in | Wt 206.0 lb

## 2013-07-25 DIAGNOSIS — M179 Osteoarthritis of knee, unspecified: Secondary | ICD-10-CM | POA: Insufficient documentation

## 2013-07-25 DIAGNOSIS — M170 Bilateral primary osteoarthritis of knee: Secondary | ICD-10-CM

## 2013-07-25 DIAGNOSIS — I10 Essential (primary) hypertension: Secondary | ICD-10-CM

## 2013-07-25 DIAGNOSIS — M1710 Unilateral primary osteoarthritis, unspecified knee: Secondary | ICD-10-CM

## 2013-07-25 DIAGNOSIS — E669 Obesity, unspecified: Secondary | ICD-10-CM

## 2013-07-25 MED ORDER — NAPROXEN 500 MG PO TABS: 500.0000 mg | ORAL_TABLET | Freq: Two times a day (BID) | ORAL | Status: DC

## 2013-07-25 NOTE — Patient Instructions (Signed)
Start naprosyn twice a day with food Ice the knees  Continue the blood pressure medicatons Work on your weight  F/U 6 months for PHYSICAL

## 2013-07-25 NOTE — Progress Notes (Signed)
Patient ID: Robin Villa, female   DOB: [birth year], 63 y.o.   MRN: [id number]   Subjective:    Patient ID: Robin Villa, female    DOB: 06-22-1950, 63 y.o.   MRN: [id number]  Patient presents for F/U and Knee edema  Patient here to follow chronic medical problems. She's been tolerating her blood pressure medication without any difficulty. She is also had some knee swelling and pain over the past couple weeks. 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 She's not tried any over-the-counter medications. She has not had any buckling of the knees it has not given out she's not had any falls.   Review Of Systems:  GEN- denies fatigue, fever, weight loss,weakness, recent illness HEENT- denies eye drainage, change in vision, nasal discharge, CVS- denies chest pain, palpitations RESP- denies SOB, cough, wheeze ABD- denies N/V, change in stools, abd pain GU- denies dysuria, hematuria, dribbling, incontinence MSK- +joint pain, muscle aches, injury Neuro- denies headache, dizziness, syncope, seizure activity       Objective:    BP 136/84  Pulse 64  Temp(Src) 97.8 F (36.6 C) (Oral)  Resp 12  Ht 5\' 5"  (1.651 m)  Wt 206 lb (93.441 kg)  BMI 34.28 kg/m2 GEN- NAD, alert and oriented x3 HEENT- PERRL, EOMI, non injected sclera, pink conjunctiva, MMM, oropharynx clear Neck- Supple,  CVS- RRR, no murmur RESP-CTAB MSK- bilat knees- mild swelling, mild crepitus fair range of motion, ligaments in tact EXT- No edema Pulses- Radial, DP- 2+        Assessment & Plan:      Problem List Items Addressed This Visit   OA (osteoarthritis) of knee   Relevant Medications      naproxen (NAPROSYN) tablet   Essential hypertension, benign - Primary     Blood pressure well controlled     Relevant Orders      CBC with Differential      Comprehensive metabolic panel      Note: This dictation was prepared with Dragon dictation along with smaller  phrase technology. Any transcriptional errors that result from this process are unintentional.

## 2013-07-25 NOTE — Assessment & Plan Note (Signed)
Blood pressure well controlled

## 2013-07-25 NOTE — Assessment & Plan Note (Signed)
I think this is osteoarthritis of the knee. We will put her on Naprosyn twice a day with food we'll also have her ice the knees to wear supportive shoes. Will give this a couple weeks if this does not help bowel go ahead and obtain imaging and we may need steroid injections into the knees

## 2013-07-25 NOTE — Assessment & Plan Note (Signed)
Weight loss also discussed how this will also help her joint pain

## 2013-07-26 LAB — CBC WITH DIFFERENTIAL/PLATELET
BASOS ABS: 0.1 10*3/uL (ref 0.0–0.1)
Basophils Relative: 1 % (ref 0–1)
Eosinophils Absolute: 0.1 10*3/uL (ref 0.0–0.7)
Eosinophils Relative: 1 % (ref 0–5)
HEMATOCRIT: 32.4 % — AB (ref 36.0–46.0)
Hemoglobin: 10.9 g/dL — ABNORMAL LOW (ref 12.0–15.0)
LYMPHS PCT: 18 % (ref 12–46)
Lymphs Abs: 1.5 10*3/uL (ref 0.7–4.0)
MCH: 28.8 pg (ref 26.0–34.0)
MCHC: 33.6 g/dL (ref 30.0–36.0)
MCV: 85.5 fL (ref 78.0–100.0)
MONO ABS: 0.7 10*3/uL (ref 0.1–1.0)
Monocytes Relative: 8 % (ref 3–12)
NEUTROS ABS: 6.2 10*3/uL (ref 1.7–7.7)
Neutrophils Relative %: 72 % (ref 43–77)
PLATELETS: 264 10*3/uL (ref 150–400)
RBC: 3.79 MIL/uL — AB (ref 3.87–5.11)
RDW: 13.8 % (ref 11.5–15.5)
WBC: 8.6 10*3/uL (ref 4.0–10.5)

## 2013-07-26 LAB — COMPREHENSIVE METABOLIC PANEL
ALBUMIN: 4.3 g/dL (ref 3.5–5.2)
ALT: 17 U/L (ref 0–35)
AST: 20 U/L (ref 0–37)
Alkaline Phosphatase: 56 U/L (ref 39–117)
BUN: 33 mg/dL — ABNORMAL HIGH (ref 6–23)
CALCIUM: 9.6 mg/dL (ref 8.4–10.5)
CHLORIDE: 102 meq/L (ref 96–112)
CO2: 23 mEq/L (ref 19–32)
Creat: 1.42 mg/dL — ABNORMAL HIGH (ref 0.50–1.10)
Glucose, Bld: 100 mg/dL — ABNORMAL HIGH (ref 70–99)
POTASSIUM: 4.5 meq/L (ref 3.5–5.3)
Sodium: 136 mEq/L (ref 135–145)
TOTAL PROTEIN: 7.4 g/dL (ref 6.0–8.3)
Total Bilirubin: 0.2 mg/dL (ref 0.2–1.2)

## 2013-07-28 LAB — VITAMIN B12: Vitamin B-12: 327 pg/mL (ref 211–911)

## 2013-07-28 LAB — FERRITIN: Ferritin: 324 ng/mL — ABNORMAL HIGH (ref 10–291)

## 2013-10-28 ENCOUNTER — Other Ambulatory Visit: Payer: Self-pay | Admitting: Family Medicine

## 2013-10-29 NOTE — Telephone Encounter (Signed)
Medication refilled per protocol. 

## 2014-01-02 ENCOUNTER — Telehealth: Payer: Self-pay | Admitting: Family Medicine

## 2014-01-02 MED ORDER — LISINOPRIL-HYDROCHLOROTHIAZIDE 20-25 MG PO TABS: 1.0000 | ORAL_TABLET | Freq: Every day | ORAL | Status: DC

## 2014-01-02 NOTE — Telephone Encounter (Signed)
[phone]  Pt is needing a refill on lisinopril-hydrochlorothiazide (PRINZIDE,ZESTORETIC) 20-25 MG per tablet  Ileana Roup

## 2014-01-02 NOTE — Telephone Encounter (Signed)
Refill appropriate and filled per protocol. 

## 2014-01-05 ENCOUNTER — Encounter: Payer: Self-pay | Admitting: Family Medicine

## 2014-01-05 ENCOUNTER — Ambulatory Visit (INDEPENDENT_AMBULATORY_CARE_PROVIDER_SITE_OTHER): Payer: BC Managed Care – PPO | Admitting: Family Medicine

## 2014-01-05 VITALS — BP 136/70 | HR 72 | Temp 99.5°F | Resp 16 | Ht 67.0 in | Wt 201.0 lb

## 2014-01-05 DIAGNOSIS — J209 Acute bronchitis, unspecified: Secondary | ICD-10-CM

## 2014-01-05 DIAGNOSIS — J0100 Acute maxillary sinusitis, unspecified: Secondary | ICD-10-CM

## 2014-01-05 MED ORDER — AMOXICILLIN 500 MG PO CAPS: 500.0000 mg | ORAL_CAPSULE | Freq: Three times a day (TID) | ORAL | Status: DC

## 2014-01-05 MED ORDER — PROMETHAZINE-CODEINE 6.25-10 MG/5ML PO SYRP: ORAL_SOLUTION | ORAL | Status: DC

## 2014-01-05 NOTE — Patient Instructions (Signed)
Continue Mucinex twice a day Cough medicine for bedtime Start antibiotics take with food  Nasal saline rinse

## 2014-01-05 NOTE — Progress Notes (Signed)
Patient ID: Robin Villa, female   DOB: 23-May-1950, 63 y.o.   MRN: [id number]   Subjective:    Patient ID: Robin Villa, female    DOB: [birth year], 63 y.o.   MRN: [id number]  Patient presents for Illness  patient here with 2 weeks of sinus drainage and pressure she now has cough which is been going on for greater than a week with production. She has not had any significant fever. She has tried over-the-counter medications. Her cough is down some as she has to sit up in a chair at night. She's not had any wheezing or chest pain or real shortness of breath. No known sick contacts    Review Of Systems: per above  GEN- denies fatigue, fever, weight loss,weakness, recent illness HEENT- denies eye drainage, change in vision, nasal discharge, CVS- denies chest pain, palpitations RESP- denies SOB, +cough, wheeze ABD- denies N/V, change in stools, abd pain Neuro- denies headache, dizziness, syncope, seizure activity       Objective:    BP 136/70 mmHg  Pulse 72  Temp(Src) 99.5 F (37.5 C) (Oral)  Resp 16  Ht 5\' 7"  (1.702 m)  Wt 201 lb (91.173 kg)  BMI 31.47 kg/m2 GEN- NAD, alert and oriented x3 HEENT- PERRL, EOMI, non injected sclera, pink conjunctiva, MMM, oropharynx mild injection, TM clear bilat no effusion,  + maxillary sinus tenderness, inflammed turbinates,  Nasal drainage  Neck- Supple,shotty LAD CVS- RRR, no murmur RESP-CTAB EXT- No edema Pulses- Radial 2+          Assessment & Plan:      Problem List Items Addressed This Visit    None    Visit Diagnoses    Acute maxillary sinusitis, recurrence not specified    -  Primary    Treat sinusitis based on duration with amox, cough med for the bronchitis, continue mucinex, she also has flonase at home    Relevant Medications       amoxicillin (AMOXIL) capsule       promethazine-codeine (PHENERGAN WITH CODEINE) 6.25-10 MG/5ML syrup    Acute bronchitis, unspecified organism           Note: This dictation was prepared with  Dragon dictation along with smaller phrase technology. Any transcriptional errors that result from this process are unintentional.

## 2014-01-21 ENCOUNTER — Ambulatory Visit (INDEPENDENT_AMBULATORY_CARE_PROVIDER_SITE_OTHER): Payer: BC Managed Care – PPO | Admitting: Family Medicine

## 2014-01-21 ENCOUNTER — Encounter: Payer: Self-pay | Admitting: Family Medicine

## 2014-01-21 VITALS — BP 138/82 | HR 76 | Temp 98.2°F | Resp 14 | Ht 67.0 in | Wt 204.0 lb

## 2014-01-21 DIAGNOSIS — E785 Hyperlipidemia, unspecified: Secondary | ICD-10-CM

## 2014-01-21 DIAGNOSIS — Z0000 Encounter for general adult medical examination without abnormal findings: Secondary | ICD-10-CM

## 2014-01-21 DIAGNOSIS — I10 Essential (primary) hypertension: Secondary | ICD-10-CM

## 2014-01-21 DIAGNOSIS — Z23 Encounter for immunization: Secondary | ICD-10-CM

## 2014-01-21 LAB — CBC WITH DIFFERENTIAL/PLATELET
BASOS ABS: 0 10*3/uL (ref 0.0–0.1)
BASOS PCT: 0 % (ref 0–1)
EOS PCT: 3 % (ref 0–5)
Eosinophils Absolute: 0.2 10*3/uL (ref 0.0–0.7)
HCT: 34.4 % — ABNORMAL LOW (ref 36.0–46.0)
Hemoglobin: 11.6 g/dL — ABNORMAL LOW (ref 12.0–15.0)
LYMPHS PCT: 18 % (ref 12–46)
Lymphs Abs: 1.3 10*3/uL (ref 0.7–4.0)
MCH: 28.9 pg (ref 26.0–34.0)
MCHC: 33.7 g/dL (ref 30.0–36.0)
MCV: 85.8 fL (ref 78.0–100.0)
MONO ABS: 0.5 10*3/uL (ref 0.1–1.0)
MPV: 10.8 fL (ref 9.4–12.4)
Monocytes Relative: 7 % (ref 3–12)
Neutro Abs: 5.3 10*3/uL (ref 1.7–7.7)
Neutrophils Relative %: 72 % (ref 43–77)
PLATELETS: 273 10*3/uL (ref 150–400)
RBC: 4.01 MIL/uL (ref 3.87–5.11)
RDW: 13.9 % (ref 11.5–15.5)
WBC: 7.4 10*3/uL (ref 4.0–10.5)

## 2014-01-21 LAB — COMPREHENSIVE METABOLIC PANEL
ALT: 15 U/L (ref 0–35)
AST: 18 U/L (ref 0–37)
Albumin: 3.9 g/dL (ref 3.5–5.2)
Alkaline Phosphatase: 53 U/L (ref 39–117)
BUN: 22 mg/dL (ref 6–23)
CALCIUM: 9.4 mg/dL (ref 8.4–10.5)
CHLORIDE: 106 meq/L (ref 96–112)
CO2: 24 mEq/L (ref 19–32)
CREATININE: 1.22 mg/dL — AB (ref 0.50–1.10)
Glucose, Bld: 99 mg/dL (ref 70–99)
Potassium: 5.1 mEq/L (ref 3.5–5.3)
Sodium: 137 mEq/L (ref 135–145)
Total Bilirubin: 0.3 mg/dL (ref 0.2–1.2)
Total Protein: 7.5 g/dL (ref 6.0–8.3)

## 2014-01-21 LAB — LIPID PANEL
CHOL/HDL RATIO: 6.4 ratio
Cholesterol: 216 mg/dL — ABNORMAL HIGH (ref 0–200)
HDL: 34 mg/dL — ABNORMAL LOW (ref >39)
LDL Cholesterol: 149 mg/dL — ABNORMAL HIGH (ref 0–99)
Triglycerides: 164 mg/dL — ABNORMAL HIGH (ref <150)
VLDL: 33 mg/dL (ref 0–40)

## 2014-01-21 NOTE — Progress Notes (Signed)
Patient ID: Robin Villa, female   DOB: 03-07-1950, 63 y.o.   MRN: [id number] Subjective:   Patient presents for Medicare Annual/Subsequent preventive examination.   Pt here for wellnes exam, no concerns today PAP SMEAR IN  2013  Meds reviewed Review Past Medical/Family/Social: per EMR   Risk Factors  Current exercise habits: walks some Dietary issues discussed: Yes  Cardiac risk factors: Obesity (BMI >= 30 kg/m2).   Depression Screen  (Note: if answer to either of the following is "Yes", a more complete depression screening is indicated)  Over the past two weeks, have you felt down, depressed or hopeless? No Over the past two weeks, have you felt little interest or pleasure in doing things? Yes Have you lost interest or pleasure in daily life? No Do you often feel hopeless? No Do you cry easily over simple problems? No   Activities of Daily Living  In your present state of health, do you have any difficulty performing the following activities?:  Driving? No  Managing money? No  Feeding yourself? No  Getting from bed to chair? No  Climbing a flight of stairs? No  Preparing food and eating?: No  Bathing or showering? No  Getting dressed: No  Getting to the toilet? No  Using the toilet:No  Moving around from place to place: No  In the past year have you fallen or had a near fall?:No  Are you sexually active? No  Do you have more than one partner? No   Hearing Difficulties: No  Do you often ask people to speak up or repeat themselves? No  Do you experience ringing or noises in your ears? No Do you have difficulty understanding soft or whispered voices? No  Do you feel that you have a problem with memory? No Do you often misplace items? No  Do you feel safe at home? Yes  Cognitive Testing  Alert? Yes Normal Appearance?Yes  Oriented to person? Yes Place? Yes  Time? Yes  Recall of three objects? Yes  Can perform simple calculations? Yes  Displays appropriate judgment?Yes   Can read the correct time from a watch face?Yes   List the Names of Other Physician/Practitioners you currently use: None  Screening Tests / Date UTD Colonoscopy                     Zostavax  Mammogram  Influenza Vaccine - Declines Tetanus/tdap  ROS: GEN- denies fatigue, fever, weight loss,weakness, recent illness HEENT- denies eye drainage, change in vision, nasal discharge, CVS- denies chest pain, palpitations RESP- denies SOB, cough, wheeze ABD- denies N/V, change in stools, abd pain GU- denies dysuria, hematuria, dribbling, incontinence MSK- denies joint pain, muscle aches, injury Neuro- denies headache, dizziness, syncope, seizure activity  PHYSICAL: GEN- NAD, alert and oriented x3 HEENT- PERRL, EOMI, non injected sclera, pink conjunctiva, MMM, oropharynx clear, TM clear bilat mild wax in canal Neck- Supple, no thryomegaly, no bruit CVS- RRR, no murmur RESP-CTAB ABD-NABS,soft,NT,ND EXT- No edema Pulses- Radial, DP- 2+   Assessment:    Annual wellness medicare exam   Plan:    During the course of the visit the patient was educated and counseled about appropriate screening and preventive services including:  Screening mammography  Screen Neg for depression. Her husband did pass away this year, she has other family around her. Next PAP In 2016 Diet review for nutrition referral? Yes ____ Not Indicated __x__  Patient Instructions (the written plan) was given to the patient.  Medicare Attestation  I have personally reviewed:  The patient's medical and social history  Their use of alcohol, tobacco or illicit drugs  Their current medications and supplements  The patient's functional ability including ADLs,fall risks, home safety risks, cognitive, and hearing and visual impairment  Diet and physical activities  Evidence for depression or mood disorders  The patient's weight, height, BMI, and visual acuity have been recorded in the chart. I have made referrals,  counseling, and provided education to the patient based on review of the above and I have provided the patient with a written personalized care plan for preventive services.

## 2014-01-21 NOTE — Assessment & Plan Note (Signed)
Well controlled 

## 2014-01-21 NOTE — Assessment & Plan Note (Signed)
Check lipids 

## 2014-01-21 NOTE — Addendum Note (Signed)
Addended by: Sheral Flow on: 01/21/2014 08:36 AM   Modules accepted: Orders

## 2014-01-21 NOTE — Patient Instructions (Addendum)
I recommend eye visit once a year I recommend dental visit every 6 months Goal is to  Exercise 30 minutes 5 days a week We will send a letter with lab results  Schedule your mammogram Schedule your Bone Density Prevnar 13 pneumonia shot given F/U 6 months

## 2014-02-05 ENCOUNTER — Encounter: Payer: Self-pay | Admitting: *Deleted

## 2014-04-03 ENCOUNTER — Other Ambulatory Visit: Payer: Self-pay | Admitting: *Deleted

## 2014-04-03 MED ORDER — LISINOPRIL-HYDROCHLOROTHIAZIDE 20-25 MG PO TABS: 1.0000 | ORAL_TABLET | Freq: Every day | ORAL | Status: DC

## 2014-04-03 NOTE — Telephone Encounter (Signed)
Received fax requesting refill on Lisinopril/HCTZ.   Refill appropriate and filled per protocol.

## 2014-05-04 DIAGNOSIS — H43813 Vitreous degeneration, bilateral: Secondary | ICD-10-CM | POA: Diagnosis not present

## 2014-05-04 DIAGNOSIS — H40023 Open angle with borderline findings, high risk, bilateral: Secondary | ICD-10-CM | POA: Diagnosis not present

## 2014-05-04 DIAGNOSIS — H25813 Combined forms of age-related cataract, bilateral: Secondary | ICD-10-CM | POA: Diagnosis not present

## 2014-05-06 ENCOUNTER — Encounter: Payer: Self-pay | Admitting: Family Medicine

## 2014-05-06 ENCOUNTER — Ambulatory Visit (INDEPENDENT_AMBULATORY_CARE_PROVIDER_SITE_OTHER): Payer: BC Managed Care – PPO | Admitting: Family Medicine

## 2014-05-06 VITALS — BP 130/80 | HR 76 | Temp 98.4°F | Resp 12 | Ht 67.0 in | Wt 205.0 lb

## 2014-05-06 DIAGNOSIS — M75101 Unspecified rotator cuff tear or rupture of right shoulder, not specified as traumatic: Secondary | ICD-10-CM | POA: Diagnosis not present

## 2014-05-06 MED ORDER — HYDROCODONE-ACETAMINOPHEN 5-325 MG PO TABS: 1.0000 | ORAL_TABLET | Freq: Four times a day (QID) | ORAL | Status: DC | PRN

## 2014-05-06 MED ORDER — DICLOFENAC SODIUM 75 MG PO TBEC: 75.0000 mg | DELAYED_RELEASE_TABLET | Freq: Two times a day (BID) | ORAL | Status: DC

## 2014-05-06 MED ORDER — LISINOPRIL-HYDROCHLOROTHIAZIDE 20-25 MG PO TABS
1.0000 | ORAL_TABLET | Freq: Every day | ORAL | Status: DC
Start: 2014-05-06 — End: 2014-07-24

## 2014-05-06 NOTE — Patient Instructions (Signed)
Referral to othopedic surgery for your shoulder Take diclofenac with food Stop the other ibuprofen or aleve Pain medicine at bedtime  ICE pack to shoulder  F/U as previous

## 2014-05-06 NOTE — Progress Notes (Signed)
Patient ID: Robin Villa, female   DOB: 1950-03-09, 64 y.o.   MRN: [id number]   Subjective:    Patient ID: Robin Villa, female    DOB: [birth year], 64 y.o.   MRN: [id number]  Patient presents for R Shoulder Pain  Patient here with right shoulder pain which has been on and off for the past few months but worsening of the past couple weeks. She now has a lot of weakness in her arm and cannot lift anything heavy without dropping it. She cannot get her arm above shoulder height without significant pain. She feels the pain from the top of her shoulder down her bicep region. 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 She is unsure if she injured her arm at work or not. She's been taking ibuprofen and Aleve but this has not helped very much. She is unable to lay on her shoulder to sleep. She denies any tingling or numbness in her fingertips. Denies any neck pain.   Review Of Systems:  GEN- denies fatigue, fever, weight loss,weakness, recent illness HEENT- denies eye drainage, change in vision, nasal discharge, CVS- denies chest pain, palpitations RESP- denies SOB, cough, wheeze ABD- denies N/V, change in stools, abd pain GU- denies dysuria, hematuria, dribbling, incontinence MSK- +joint pain, muscle aches, injury Neuro- denies headache, dizziness, syncope, seizure activity       Objective:    BP 130/80 mmHg  Pulse 76  Temp(Src) 98.4 F (36.9 C) (Oral)  Resp 12  Ht 5\' 7"  (1.702 m)  Wt 205 lb (92.987 kg)  BMI 32.10 kg/m2 GEN- NAD, alert and oriented x3 MSK- Neck fair ROM, right shoulder normal inspection, right biceps mild swelling no bruising, +empty can, decreased ROM, can not raise to shoulder height, can not cross to left side, biceps in tact, pain with extension of arm EXT- No edema Pulses- Radial- 2+        Assessment & Plan:      Problem List Items Addressed This Visit    None    Visit Diagnoses    Rotator cuff syndrome,  right    -  Primary    Weakness and significant decrease in ROM, possible she has a small tear, will place on Diclofenc, given restrictions for work, no more than 15lbs, no above the shoulder movements, use ICE, diclofenac twice a day, norco for bedtime, referral to orthopedics for injection and evaluation       Note: This dictation was prepared with Dragon dictation along with smaller phrase technology. Any transcriptional errors that result from this process are unintentional.

## 2014-06-09 ENCOUNTER — Ambulatory Visit (INDEPENDENT_AMBULATORY_CARE_PROVIDER_SITE_OTHER): Payer: BC Managed Care – PPO | Admitting: Orthopedic Surgery

## 2014-06-09 ENCOUNTER — Ambulatory Visit (HOSPITAL_COMMUNITY)
Admission: RE | Admit: 2014-06-09 | Discharge: 2014-06-09 | Disposition: A | Discharge status: Home / Self Care | Payer: BC Managed Care – PPO | Source: Ambulatory Visit | Attending: Orthopedic Surgery | Admitting: Orthopedic Surgery

## 2014-06-09 ENCOUNTER — Encounter: Payer: Self-pay | Admitting: Orthopedic Surgery

## 2014-06-09 ENCOUNTER — Other Ambulatory Visit: Payer: Self-pay | Admitting: Orthopedic Surgery

## 2014-06-09 VITALS — BP 143/69 | Ht 67.0 in | Wt 205.0 lb

## 2014-06-09 DIAGNOSIS — M75101 Unspecified rotator cuff tear or rupture of right shoulder, not specified as traumatic: Secondary | ICD-10-CM | POA: Diagnosis not present

## 2014-06-09 DIAGNOSIS — M25511 Pain in right shoulder: Secondary | ICD-10-CM | POA: Insufficient documentation

## 2014-06-09 DIAGNOSIS — M19011 Primary osteoarthritis, right shoulder: Secondary | ICD-10-CM | POA: Diagnosis not present

## 2014-06-09 NOTE — Patient Instructions (Signed)
Call [facility] to schedule therapy appt

## 2014-06-09 NOTE — Progress Notes (Signed)
Patient ID: Robin Villa, female   DOB: 03-Apr-1950, 64 y.o.   MRN: [id number]  Chief Complaint  Patient presents with  . Shoulder Pain    right shoulder pain, ref [place]    BP 143/69 mmHg  Ht 5\' 7"  (1.702 m)  Wt 205 lb (92.987 kg)  BMI 32.10 kg/m2  Patient ID: Robin Villa, female   DOB: September 29, 1950, 64 y.o.   MRN: [id number]  Chief Complaint  Patient presents with  . Shoulder Pain    right shoulder pain, ref [place]     Robin Villa is a 64 y.o. female.   HPI 64 yo  female presents with one-year history of pain in her right shoulder with activity. Whether doesn't bother her brain doesn't bother her cold doesn't bother her just excessive lifting over her head.  Symptoms include locking giving out throbbing aching but she improved significantly with oral medication Review of Systems Sinusitis ankle leg edema seasonal allergy stiff joints swollen joints all other systems were reviewed and were negative patient reported    Past Medical History  Diagnosis Date  . Hypertension   . Heart murmur     asymptomatic    Past Surgical History  Procedure Laterality Date  . Tubal ligation    . Carpal tunnel release      bilateral   . Colonoscopy  09/15/2011    Procedure: COLONOSCOPY;  Surgeon: Danie Binder, MD;  Location: [facility];  Service: Endoscopy;  Laterality: N/A;  11:00AM    Family History  Problem Relation Age of Onset  . Heart disease Father   . Stroke Father   . Hypertension Sister   . Hypertension Sister   . Heart disease Brother   . Hypertension Mother     Social History History  Substance Use Topics  . Smoking status: Former Smoker -- 0.30 packs/day for 35 years    Types: Cigarettes    Quit date: 08/22/1996  . Smokeless tobacco: Never Used  . Alcohol Use: No    No Known Allergies  Current Outpatient Prescriptions  Medication Sig Dispense Refill  . Calcium Carb-Cholecalciferol (CALCIUM + D3) 600-200 MG-UNIT TABS Take by mouth 2 (two) times daily.    .  diclofenac (VOLTAREN) 75 MG EC tablet Take 1 tablet (75 mg total) by mouth 2 (two) times daily. 60 tablet 2  . HYDROcodone-acetaminophen (NORCO) 5-325 MG per tablet Take 1 tablet by mouth every 6 (six) hours as needed for moderate pain. 30 tablet 0  . lisinopril-hydrochlorothiazide (PRINZIDE,ZESTORETIC) 20-25 MG per tablet Take 1 tablet by mouth daily. 30 tablet 6   No current facility-administered medications for this visit.       Physical Exam Blood pressure 143/69, height 5\' 7"  (1.702 m), weight 205 lb (92.987 kg). Physical Exam The patient is well developed well nourished and well groomed. Orientation to person place and time is normal  Mood is pleasant.  Left shoulder full range of motion no tenderness strength normal. Stable in abduction external rotation. Axilla and supraclavicular area lymph nodes negative skin normal neurovascular exam intact  Right shoulder weakness in her rotator cuff supraspinatus. No tenderness. Stability tests were normal. Skin normal neurovascular exam normal lymph nodes axilla clavicle normal  Data Reviewed X-rays were done at the hospital she has a lot of bone spur activity around the before meals joint and undersurface of the acromion.  Assessment Rotator cuff syndrome with rotator cuff weakness Plan Occupational physical therapy for 6 weeks follow-up as needed

## 2014-07-07 ENCOUNTER — Ambulatory Visit (HOSPITAL_COMMUNITY): Payer: BC Managed Care – PPO | Attending: Orthopedic Surgery | Admitting: Occupational Therapy

## 2014-07-07 ENCOUNTER — Encounter (HOSPITAL_COMMUNITY): Payer: Self-pay | Admitting: Occupational Therapy

## 2014-07-07 DIAGNOSIS — M629 Disorder of muscle, unspecified: Secondary | ICD-10-CM | POA: Insufficient documentation

## 2014-07-07 DIAGNOSIS — M75101 Unspecified rotator cuff tear or rupture of right shoulder, not specified as traumatic: Secondary | ICD-10-CM | POA: Diagnosis not present

## 2014-07-07 DIAGNOSIS — M6289 Other specified disorders of muscle: Secondary | ICD-10-CM

## 2014-07-07 DIAGNOSIS — M6281 Muscle weakness (generalized): Secondary | ICD-10-CM | POA: Insufficient documentation

## 2014-07-07 DIAGNOSIS — M25511 Pain in right shoulder: Secondary | ICD-10-CM | POA: Insufficient documentation

## 2014-07-07 DIAGNOSIS — M7581 Other shoulder lesions, right shoulder: Secondary | ICD-10-CM | POA: Insufficient documentation

## 2014-07-07 DIAGNOSIS — M25611 Stiffness of right shoulder, not elsewhere classified: Secondary | ICD-10-CM

## 2014-07-07 DIAGNOSIS — R531 Weakness: Secondary | ICD-10-CM

## 2014-07-07 NOTE — Therapy (Addendum)
[facility] [address] [place], [place], [address] Phone: [phone]   Fax:  [phone]  Occupational Therapy Evaluation  Patient Details  Name: Robin Villa MRN: [id number] Date of Birth: [birth year] Referring Provider:  Carole Civil, MD  Encounter Date: 07/07/2014      OT End of Session - 07/07/14 1648    Visit Number 1   Number of Visits 1   Date for OT Re-Evaluation 09/05/14   Authorization Type BCBS   OT Start Time 1430   OT Stop Time 1508   OT Time Calculation (min) 38 min   Activity Tolerance Patient tolerated treatment well   Behavior During Therapy [name] for tasks assessed/performed      Past Medical History  Diagnosis Date  . Hypertension   . Heart murmur     asymptomatic    Past Surgical History  Procedure Laterality Date  . Tubal ligation    . Carpal tunnel release      bilateral   . Colonoscopy  09/15/2011    Procedure: COLONOSCOPY;  Surgeon: Danie Binder, MD;  Location: [facility];  Service: Endoscopy;  Laterality: N/A;  11:00AM    There were no vitals filed for this visit.  Visit Diagnosis:  Rotator cuff syndrome of right shoulder  Pain in joint, shoulder region, right  Tight fascia  Decreased range of motion of shoulder, right  Decreased strength      Subjective Assessment - 07/07/14 1515    Subjective  S: My shoulder just feels weak. It hurts sometimes at night though.    Pertinent History Pt is a 64 y/o female with right rotator cuff syndrome beginning in January. 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 Pt is able to complete B/IADL tasks but reports her shoulder feels weak. Dr Aline Brochure referred pt to occupational therapy for evaluation and treatment.    Special Tests FOTO Score: 48/100 (52% impairment)   Patient Stated Goals To get my arm stronger.    Currently in Pain? No/denies           [facility] OT Assessment - 07/07/14 1426    Assessment   Diagnosis right rotator cuff syndrome   Onset Date 01/30/14   Prior Therapy None   Precautions   Precautions None   Balance Screen   Has the patient fallen in the past 6 months No   Has the patient had a decrease in activity level because of a fear of falling?  No   Is the patient reluctant to leave their home because of a fear of falling?  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 Driving bus   ADL   ADL comments Prior to inflammation medication, pt had some pain with reaching and lifting but is doing ok now. Pt cares for Mother on a rotating schedule with siblings-lifting, pulling, reaching, etc.    Written Expression   Dominant Hand Right   Vision - History   Baseline Vision Wears glasses all the time   Visual History Cataracts   Cognition   Overall Cognitive Status Within Functional Limits for tasks assessed   ROM / Strength   AROM / PROM / Strength AROM;PROM;Strength  Palpation   Palpation comment Pt has mod-max fascial restrictions in right upper arm and trapezius regions.    AROM   Overall AROM Comments Assessed seated, ER/IR adducted    AROM Assessment Site Shoulder   Right/Left Shoulder Right   Right Shoulder Flexion 155 Degrees   Right Shoulder ABduction 150 Degrees   Right Shoulder Internal Rotation 90 Degrees   Right Shoulder External Rotation 39 Degrees              Strength   Overall Strength Comments Assessed seated, ER/IR adducted    Strength Assessment Site Shoulder   Right/Left Shoulder Right   Right Shoulder Flexion 4/5   Right Shoulder ABduction 4/5   Right Shoulder Internal Rotation 4+/5   Right Shoulder External Rotation 4+/5           OT Education - 07/07/14 1514    Education provided  Yes   Education Details Shoulder stretches, scapular theraband exercises, theraband strengthening exercises   Person(s) Educated Patient   Methods Explanation;Demonstration;Handout   Comprehension Verbalized understanding;Returned demonstration          OT Short Term Goals - 07/07/14 1652    OT SHORT TERM GOAL #1   Title Pt will be educated on HEP.    Time 1   Period Days   Status Achieved            Plan - 07/07/14 1649    Clinical Impression Statement A: Pt is a 64 y/o female with right rotator cuff syndrome who has been experiencing pain and weakness in right shoulder since 01/2014 presenting with increased fascial restrictions and pain, decreased range of motion and strength. Pt has been using some heat for pain relief. Pt reports she is able to complete all B/IADLs however has pain at night and after she uses the shoulder a lot during the day.    Pt will benefit from skilled therapeutic intervention in order to improve on the following deficits (Retired) Decreased strength;Pain;Impaired UE functional use;Decreased range of motion;Increased fascial restricitons;Impaired flexibility   Rehab Potential Good   OT Frequency 1x / week   OT Duration --  1 visit   OT Treatment/Interventions Patient/family education   Plan P: Educated pt on shoulder stretching and theraband HEP.    OT Home Exercise Plan shoulder stretches and theraband   Consulted and Agree with Plan of Care Patient     G-Codes:  Carrying, Moving, Handling Objects Current status: CK: At least 40 % impaired but less than 60% impaired, limited, or restricted.  Goal status: CK: At least 40% impaired but less than 60% impaired, limited, or restricted.  Discharge Status: CK: At least 40% impaired but less than 60% impaired, limited, or restricted.    Problem List Patient Active Problem List   Diagnosis Date Noted  . OA (osteoarthritis) of knee 07/25/2013  . Hyperlipidemia 12/04/2011  . Encounter for screening  colonoscopy 08/23/2011  . Hemorrhoids 08/23/2011  . Gynecological examination 04/10/2011  . Essential hypertension, benign 03/20/2011  . Obesity, unspecified 03/20/2011  . DUB (dysfunctional uterine bleeding) 03/20/2011    Guadelupe Sabin, OTR/L  [phone]  07/07/2014, 4:54 PM  [place], [place], [address] Phone: [phone]   Fax:  [phone]

## 2014-07-07 NOTE — Patient Instructions (Signed)
1) Flexion Wall Stretch    Face wall, place affected handon wall in front of you. Slide hand up the wall  and lean body in towards the wall. Hold for 5 seconds. Repeat 10 times. 1-2 times/day.    2) Towel Stretch with Internal Rotation   Gently pull up your affected arm  behind your back with the assist of a towel   3) Corner Stretch    Stand at a corner of a wall, place your arms on the walls with elbows bent. Lean into the corner until a stretch is felt along the front of your chest and/or shoulders. Hold for 5 seconds. Repeat 10X, 1-2 times/day.    4) Posterior Capsule Stretch    Bring the involved arm across chest. Grasp elbow and pull toward chest until you feel a stretch in the back of the upper arm and shoulder. Hold 5 seconds. Repeat 10X. Complete 1-2 times/day.    5) Scapular Retraction    Tuck chin back as you pinch shoulder blades together.  Hold 5 seconds. Repeat 10X. Complete 1-2 times/day.     1) Strengthening: Chest Pull - Resisted   Hold Theraband in front of body with hands about shoulder width a part. Pull band a part and back together slowly. Repeat __10-15__ times. Complete __1__ set(s) per session.. Repeat _1-2___ session(s) per day.  http://orth.exer.us/926   Copyright  VHI. All rights reserved.   2) PNF Strengthening: Resisted   Standing with resistive band around each hand, bring right arm up and away, thumb back. Repeat _10-15___ times per set. Do _1___ sets per session. Do _1-2___ sessions per day.  http://orth.exer.us/918   Copyright  VHI. All rights reserved.   3) PNF Strengthening: Resisted   Standing with resistive band around each hand, bring right arm up and across body. Repeat _10-15___ times per set. Do __1__ sets per session. Do _1-2___ sessions per day.  http://orth.exer.us/920   Copyright  VHI. All rights reserved.    4) Resisted External Rotation: in Neutral - Bilateral   Sit or stand, tubing in both hands,  elbows at sides, bent to 90, forearms forward. Pinch shoulder blades together and rotate forearms out. Keep elbows at sides. Repeat _10-15___ times per set. Do __1__ sets per session. Do _1-2___ sessions per day.  http://orth.exer.us/966   Copyright  VHI. All rights reserved.   5) PNF Strengthening: Resisted   Standing, hold resistive band above head. Bring right arm down and out from side. Repeat _10-15___ times per set. Do _1___ sets per session. Do _1-2___ sessions per day.  http://orth.exer.us/922   Copyright  VHI. All rights reserved.      1) (Home) Extension: Isometric / Bilateral Arm Retraction - Sitting   Facing anchor, hold hands and elbow at shoulder height, with elbow bent.  Pull arms back to squeeze shoulder blades together. Repeat 10-15 times.  Copyright  VHI. All rights reserved.   2) (Home) Retraction: Row - Bilateral (Anchor)   Facing anchor, arms reaching forward, pull hands toward stomach, keeping elbows bent and at your sides and pinching shoulder blades together. Repeat 10-15 times.  Copyright  VHI. All rights reserved.   3) (Clinic) Extension / Flexion (Assist)   Face anchor, pull arms back, keeping elbow straight, and squeze shoulder blades together. Repeat 10-15 times.   Copyright  VHI. All rights reserved.

## 2014-07-20 DIAGNOSIS — H25813 Combined forms of age-related cataract, bilateral: Secondary | ICD-10-CM | POA: Diagnosis not present

## 2014-07-20 DIAGNOSIS — H40023 Open angle with borderline findings, high risk, bilateral: Secondary | ICD-10-CM | POA: Diagnosis not present

## 2014-07-24 ENCOUNTER — Encounter: Payer: Self-pay | Admitting: Family Medicine

## 2014-07-24 ENCOUNTER — Ambulatory Visit (INDEPENDENT_AMBULATORY_CARE_PROVIDER_SITE_OTHER): Payer: BC Managed Care – PPO | Admitting: Family Medicine

## 2014-07-24 VITALS — BP 136/84 | HR 74 | Temp 98.0°F | Resp 16 | Wt 201.0 lb

## 2014-07-24 DIAGNOSIS — M170 Bilateral primary osteoarthritis of knee: Secondary | ICD-10-CM

## 2014-07-24 DIAGNOSIS — I10 Essential (primary) hypertension: Secondary | ICD-10-CM | POA: Diagnosis not present

## 2014-07-24 DIAGNOSIS — E785 Hyperlipidemia, unspecified: Secondary | ICD-10-CM | POA: Diagnosis not present

## 2014-07-24 DIAGNOSIS — E669 Obesity, unspecified: Secondary | ICD-10-CM | POA: Diagnosis not present

## 2014-07-24 LAB — CBC WITH DIFFERENTIAL/PLATELET
Basophils Absolute: 0 10*3/uL (ref 0.0–0.1)
Basophils Relative: 0 % (ref 0–1)
EOS PCT: 2 % (ref 0–5)
Eosinophils Absolute: 0.2 10*3/uL (ref 0.0–0.7)
HCT: 35.1 % — ABNORMAL LOW (ref 36.0–46.0)
Hemoglobin: 12 g/dL (ref 12.0–15.0)
LYMPHS ABS: 1.1 10*3/uL (ref 0.7–4.0)
LYMPHS PCT: 13 % (ref 12–46)
MCH: 29.6 pg (ref 26.0–34.0)
MCHC: 34.2 g/dL (ref 30.0–36.0)
MCV: 86.5 fL (ref 78.0–100.0)
MPV: 11.1 fL (ref 8.6–12.4)
Monocytes Absolute: 0.5 10*3/uL (ref 0.1–1.0)
Monocytes Relative: 6 % (ref 3–12)
NEUTROS PCT: 79 % — AB (ref 43–77)
Neutro Abs: 6.7 10*3/uL (ref 1.7–7.7)
PLATELETS: 235 10*3/uL (ref 150–400)
RBC: 4.06 MIL/uL (ref 3.87–5.11)
RDW: 13.8 % (ref 11.5–15.5)
WBC: 8.5 10*3/uL (ref 4.0–10.5)

## 2014-07-24 LAB — LIPID PANEL
CHOL/HDL RATIO: 6.2 ratio
CHOLESTEROL: 206 mg/dL — AB (ref 0–200)
HDL: 33 mg/dL — ABNORMAL LOW (ref >=46)
LDL Cholesterol: 144 mg/dL — ABNORMAL HIGH (ref 0–99)
Triglycerides: 144 mg/dL (ref <150)
VLDL: 29 mg/dL (ref 0–40)

## 2014-07-24 LAB — COMPREHENSIVE METABOLIC PANEL
ALK PHOS: 62 U/L (ref 39–117)
ALT: 17 U/L (ref 0–35)
AST: 18 U/L (ref 0–37)
Albumin: 3.9 g/dL (ref 3.5–5.2)
BILIRUBIN TOTAL: 0.4 mg/dL (ref 0.2–1.2)
BUN: 28 mg/dL — ABNORMAL HIGH (ref 6–23)
CHLORIDE: 103 meq/L (ref 96–112)
CO2: 23 mEq/L (ref 19–32)
CREATININE: 1.3 mg/dL — AB (ref 0.50–1.10)
Calcium: 9.6 mg/dL (ref 8.4–10.5)
Glucose, Bld: 102 mg/dL — ABNORMAL HIGH (ref 70–99)
Potassium: 4.6 mEq/L (ref 3.5–5.3)
Sodium: 139 mEq/L (ref 135–145)
Total Protein: 7.3 g/dL (ref 6.0–8.3)

## 2014-07-24 MED ORDER — LISINOPRIL-HYDROCHLOROTHIAZIDE 20-25 MG PO TABS
1.0000 | ORAL_TABLET | Freq: Every day | ORAL | Status: DC
Start: 2014-07-24 — End: 2015-01-28

## 2014-07-24 MED ORDER — DICLOFENAC SODIUM 75 MG PO TBEC: 75.0000 mg | DELAYED_RELEASE_TABLET | Freq: Two times a day (BID) | ORAL | Status: DC

## 2014-07-24 NOTE — Assessment & Plan Note (Signed)
Blood pressure well controlled no change in medication 

## 2014-07-24 NOTE — Progress Notes (Signed)
Patient ID: Robin Villa, female   DOB: 1950/07/05, 64 y.o.   MRN: [id number]   Subjective:    Patient ID: Robin Villa, female    DOB: 1950-06-18, 64 y.o.   MRN: [id number]  Patient presents for Follow-up  is here to follow-up medications. She does complain of some knee pain on and off. She also gets swelling of her right knee. This is been going on for quite some time worse when she is standing. When she takes ibuprofen or Aleve this helps the pain significantly and then she is able to do her activities. She denies any giving out of the knee or any locking of the knee.    Review Of Systems:  GEN- denies fatigue, fever, weight loss,weakness, recent illness HEENT- denies eye drainage, change in vision, nasal discharge, CVS- denies chest pain, palpitations RESP- denies SOB, cough, wheeze ABD- denies N/V, change in stools, abd pain GU- denies dysuria, hematuria, dribbling, incontinence MSK-+joint pain, muscle aches, injury Neuro- denies headache, dizziness, syncope, seizure activity       Objective:    BP 136/84 mmHg  Pulse 74  Temp(Src) 98 F (36.7 C) (Oral)  Resp 16  Wt 201 lb (91.173 kg) GEN- NAD, alert and oriented x3 HEENT- PERRL, EOMI, non injected sclera, pink conjunctiva, MMM, oropharynx clear Neck- Supple, no thyromegaly CVS- RRR, no murmur RESP-CTAB MSK- bilat knee- normal inspection, fair ROM, mild crepitus, no effusion,. Ligaments in tact EXT- No edema Pulses- Radial, DP- 2+        Assessment & Plan:      Problem List Items Addressed This Visit    Obesity   Hyperlipidemia - Primary   Relevant Medications   lisinopril-hydrochlorothiazide (PRINZIDE,ZESTORETIC) 20-25 MG per tablet   Other Relevant Orders   Lipid panel   Essential hypertension, benign   Relevant Medications   lisinopril-hydrochlorothiazide (PRINZIDE,ZESTORETIC) 20-25 MG per tablet   Other Relevant Orders   CBC with Differential/Platelet   Comprehensive metabolic panel      Note: This  dictation was prepared with Dragon dictation along with smaller phrase technology. Any transcriptional errors that result from this process are unintentional.

## 2014-07-24 NOTE — Patient Instructions (Signed)
Use diclofenac as needed ICE if swells, if this does not improve call me, you can see ortho  We will call with lab results F/U 6 months for PHYSICAL

## 2014-07-24 NOTE — Assessment & Plan Note (Signed)
Osteoarthritis of the knee. I've given her diclofenac twice a day. We discussed orthopedic intervention but she wants to hold off unless this worsens. Getting active such as working out at [employer] such as aquatics will help her weight as well as her knee pain

## 2014-07-28 ENCOUNTER — Other Ambulatory Visit: Payer: Self-pay | Admitting: Family Medicine

## 2014-07-28 MED ORDER — SIMVASTATIN 20 MG PO TABS: 20.0000 mg | ORAL_TABLET | Freq: Every day | ORAL | Status: DC

## 2014-08-28 ENCOUNTER — Encounter: Payer: Self-pay | Admitting: Family Medicine

## 2014-08-28 NOTE — Patient Instructions (Signed)
Your procedure is scheduled on: 09/07/2014  Report to [facility] at  30   AM.  Call this number if you have problems the morning of surgery: [phone]   Do not eat food or drink liquids :After Midnight.      Take these medicines the morning of surgery with A SIP OF WATER: voltaren, lisinopril   Do not wear jewelry, make-up or nail polish.  Do not wear lotions, powders, or perfumes.   Do not shave 48 hours prior to surgery.  Do not bring valuables to the hospital.  Contacts, dentures or bridgework may not be worn into surgery.  Leave suitcase in the car. After surgery it may be brought to your room.  For patients admitted to the hospital, checkout time is 11:00 AM the day of discharge.   Patients discharged the day of surgery will not be allowed to drive home.  :     Please read over the following fact sheets that you were given: Coughing and Deep Breathing, Surgical Site Infection Prevention, Anesthesia Post-op Instructions and Care and Recovery After Surgery    Cataract A cataract is a clouding of the lens of the eye. When a lens becomes cloudy, vision is reduced based on the degree and nature of the clouding. Many cataracts reduce vision to some degree. Some cataracts make people more near-sighted as they develop. Other cataracts increase glare. Cataracts that are ignored and become worse can sometimes look white. The white color can be seen through the pupil. CAUSES   Aging. However, cataracts may occur at any age, even in newborns.   Certain drugs.   Trauma to the eye.   Certain diseases such as diabetes.   Specific eye diseases such as chronic inflammation inside the eye or a sudden attack of a rare form of glaucoma.   Inherited or acquired medical problems.  SYMPTOMS   Gradual, progressive drop in vision in the affected eye.   Severe, rapid visual loss. This most often happens when trauma is the cause.  DIAGNOSIS  To detect a cataract, an eye doctor examines the lens.  Cataracts are best diagnosed with an exam of the eyes with the pupils enlarged (dilated) by drops.  TREATMENT  For an early cataract, vision may improve by using different eyeglasses or stronger lighting. If that does not help your vision, surgery is the only effective treatment. A cataract needs to be surgically removed when vision loss interferes with your everyday activities, such as driving, reading, or watching TV. A cataract may also have to be removed if it prevents examination or treatment of another eye problem. Surgery removes the cloudy lens and usually replaces it with a substitute lens (intraocular lens, IOL).  At a time when both you and your doctor agree, the cataract will be surgically removed. If you have cataracts in both eyes, only one is usually removed at a time. This allows the operated eye to heal and be out of danger from any possible problems after surgery (such as infection or poor wound healing). In rare cases, a cataract may be doing damage to your eye. In these cases, your caregiver may advise surgical removal right away. The vast majority of people who have cataract surgery have better vision afterward. HOME CARE INSTRUCTIONS  If you are not planning surgery, you may be asked to do the following:  Use different eyeglasses.   Use stronger or brighter lighting.   Ask your eye doctor about reducing your medicine dose or changing  medicines if it is thought that a medicine caused your cataract. Changing medicines does not make the cataract go away on its own.   Become familiar with your surroundings. Poor vision can lead to injury. Avoid bumping into things on the affected side. You are at a higher risk for tripping or falling.   Exercise extreme care when driving or operating machinery.   Wear sunglasses if you are sensitive to bright light or experiencing problems with glare.  SEEK IMMEDIATE MEDICAL CARE IF:   You have a worsening or sudden vision loss.   You notice  redness, swelling, or increasing pain in the eye.   You have a fever.  Document Released: 01/16/2005 Document Revised: 01/05/2011 Document Reviewed: 09/09/2010 [facility] Patient Information 2012 [place].PATIENT INSTRUCTIONS POST-ANESTHESIA  IMMEDIATELY FOLLOWING SURGERY:  Do not drive or operate machinery for the first twenty four hours after surgery.  Do not make any important decisions for twenty four hours after surgery or while taking narcotic pain medications or sedatives.  If you develop intractable nausea and vomiting or a severe headache please notify your doctor immediately.  FOLLOW-UP:  Please make an appointment with your surgeon as instructed. You do not need to follow up with anesthesia unless specifically instructed to do so.  WOUND CARE INSTRUCTIONS (if applicable):  Keep a dry clean dressing on the anesthesia/puncture wound site if there is drainage.  Once the wound has quit draining you may leave it open to air.  Generally you should leave the bandage intact for twenty four hours unless there is drainage.  If the epidural site drains for more than 36-48 hours please call the anesthesia department.  QUESTIONS?:  Please feel free to call your physician or the hospital operator if you have any questions, and they will be happy to assist you.

## 2014-09-02 ENCOUNTER — Other Ambulatory Visit: Payer: Self-pay

## 2014-09-02 ENCOUNTER — Encounter (HOSPITAL_COMMUNITY)
Admission: RE | Admit: 2014-09-02 | Discharge: 2014-09-02 | Disposition: A | Discharge status: Home / Self Care | Payer: BC Managed Care – PPO | Source: Ambulatory Visit | Attending: Ophthalmology | Admitting: Ophthalmology

## 2014-09-02 ENCOUNTER — Encounter (HOSPITAL_COMMUNITY): Payer: Self-pay

## 2014-09-02 DIAGNOSIS — H2511 Age-related nuclear cataract, right eye: Secondary | ICD-10-CM | POA: Diagnosis not present

## 2014-09-02 DIAGNOSIS — Z01818 Encounter for other preprocedural examination: Secondary | ICD-10-CM | POA: Insufficient documentation

## 2014-09-02 HISTORY — DX: Pure hypercholesterolemia, unspecified: E78.00

## 2014-09-02 HISTORY — DX: Unspecified osteoarthritis, unspecified site: M19.90

## 2014-09-02 NOTE — Pre-Procedure Instructions (Signed)
Patient given information to sign up for my chart at home. 

## 2014-09-04 MED ORDER — CYCLOPENTOLATE-PHENYLEPHRINE OP SOLN OPTIME - NO CHARGE
OPHTHALMIC | Status: AC
  Filled 2014-09-04: qty 2

## 2014-09-04 MED ORDER — NEOMYCIN-POLYMYXIN-DEXAMETH 3.5-10000-0.1 OP SUSP
OPHTHALMIC | Status: AC
  Filled 2014-09-04: qty 5

## 2014-09-04 MED ORDER — PHENYLEPHRINE HCL 2.5 % OP SOLN
OPHTHALMIC | Status: AC
  Filled 2014-09-04: qty 15

## 2014-09-04 MED ORDER — LIDOCAINE HCL (PF) 1 % IJ SOLN
INTRAMUSCULAR | Status: AC
  Filled 2014-09-04: qty 2

## 2014-09-04 MED ORDER — TETRACAINE HCL 0.5 % OP SOLN
OPHTHALMIC | Status: AC
  Filled 2014-09-04: qty 2

## 2014-09-04 MED ORDER — LIDOCAINE HCL 3.5 % OP GEL
OPHTHALMIC | Status: AC
  Filled 2014-09-04: qty 1

## 2014-09-07 ENCOUNTER — Ambulatory Visit (HOSPITAL_COMMUNITY)
Admission: RE | Admit: 2014-09-07 | Discharge: 2014-09-07 | Disposition: A | Discharge status: Home / Self Care | Payer: BC Managed Care – PPO | Source: Ambulatory Visit | Attending: Ophthalmology | Admitting: Ophthalmology

## 2014-09-07 ENCOUNTER — Ambulatory Visit (HOSPITAL_COMMUNITY): Payer: BC Managed Care – PPO | Admitting: Anesthesiology

## 2014-09-07 ENCOUNTER — Encounter (HOSPITAL_COMMUNITY): Payer: Self-pay | Admitting: *Deleted

## 2014-09-07 ENCOUNTER — Encounter (HOSPITAL_COMMUNITY)
Admission: RE | Disposition: A | Discharge status: Home / Self Care | Payer: Self-pay | Source: Ambulatory Visit | Attending: Ophthalmology

## 2014-09-07 DIAGNOSIS — H25811 Combined forms of age-related cataract, right eye: Secondary | ICD-10-CM | POA: Diagnosis not present

## 2014-09-07 DIAGNOSIS — H269 Unspecified cataract: Secondary | ICD-10-CM | POA: Diagnosis present

## 2014-09-07 DIAGNOSIS — Z79899 Other long term (current) drug therapy: Secondary | ICD-10-CM | POA: Diagnosis not present

## 2014-09-07 DIAGNOSIS — I10 Essential (primary) hypertension: Secondary | ICD-10-CM | POA: Insufficient documentation

## 2014-09-07 DIAGNOSIS — Z87891 Personal history of nicotine dependence: Secondary | ICD-10-CM | POA: Diagnosis not present

## 2014-09-07 HISTORY — PX: CATARACT EXTRACTION W/PHACO: SHX586

## 2014-09-07 SURGERY — PHACOEMULSIFICATION, CATARACT, WITH IOL INSERTION
Anesthesia: Monitor Anesthesia Care | Site: Eye | Laterality: Right

## 2014-09-07 MED ORDER — EPINEPHRINE HCL 1 MG/ML IJ SOLN
INTRAMUSCULAR | Status: AC
  Filled 2014-09-07: qty 1

## 2014-09-07 MED ORDER — EPINEPHRINE HCL 1 MG/ML IJ SOLN
INTRAOCULAR | Status: DC | PRN
  Administered 2014-09-07: 08:00:00

## 2014-09-07 MED ORDER — PHENYLEPHRINE HCL 2.5 % OP SOLN
1.0000 [drp] | OPHTHALMIC | Status: AC
Start: 2014-09-07 — End: 2014-09-07
  Administered 2014-09-07 (×3): 1 [drp] via OPHTHALMIC

## 2014-09-07 MED ORDER — PROVISC 10 MG/ML IO SOLN
INTRAOCULAR | Status: DC | PRN
Start: 2014-09-07 — End: 2014-09-07
  Administered 2014-09-07: 0.85 mL via INTRAOCULAR

## 2014-09-07 MED ORDER — FENTANYL CITRATE (PF) 100 MCG/2ML IJ SOLN
25.0000 ug | INTRAMUSCULAR | Status: AC
  Administered 2014-09-07: 25 ug via INTRAVENOUS

## 2014-09-07 MED ORDER — NEOMYCIN-POLYMYXIN-DEXAMETH 3.5-10000-0.1 OP SUSP
OPHTHALMIC | Status: DC | PRN
  Administered 2014-09-07: 2 [drp] via OPHTHALMIC

## 2014-09-07 MED ORDER — FENTANYL CITRATE (PF) 100 MCG/2ML IJ SOLN: 25.0000 ug | INTRAMUSCULAR | Status: DC | PRN

## 2014-09-07 MED ORDER — MIDAZOLAM HCL 2 MG/2ML IJ SOLN
INTRAMUSCULAR | Status: AC
  Filled 2014-09-07: qty 2

## 2014-09-07 MED ORDER — BSS IO SOLN
INTRAOCULAR | Status: DC | PRN
Start: 2014-09-07 — End: 2014-09-07
  Administered 2014-09-07: 15 mL

## 2014-09-07 MED ORDER — LIDOCAINE HCL 3.5 % OP GEL
1.0000 "application " | Freq: Once | OPHTHALMIC | Status: AC
  Administered 2014-09-07: 1 via OPHTHALMIC

## 2014-09-07 MED ORDER — LACTATED RINGERS IV SOLN
INTRAVENOUS | Status: DC
  Administered 2014-09-07: 08:00:00 via INTRAVENOUS

## 2014-09-07 MED ORDER — TETRACAINE HCL 0.5 % OP SOLN
1.0000 [drp] | OPHTHALMIC | Status: AC
  Administered 2014-09-07 (×3): 1 [drp] via OPHTHALMIC

## 2014-09-07 MED ORDER — ONDANSETRON HCL 4 MG/2ML IJ SOLN: 4.0000 mg | Freq: Once | INTRAMUSCULAR | Status: DC | PRN

## 2014-09-07 MED ORDER — FENTANYL CITRATE (PF) 100 MCG/2ML IJ SOLN
INTRAMUSCULAR | Status: AC
  Filled 2014-09-07: qty 2

## 2014-09-07 MED ORDER — MIDAZOLAM HCL 2 MG/2ML IJ SOLN
1.0000 mg | INTRAMUSCULAR | Status: DC | PRN
  Administered 2014-09-07: 0.5 mg via INTRAVENOUS
  Administered 2014-09-07: 1.5 mg via INTRAVENOUS

## 2014-09-07 MED ORDER — CYCLOPENTOLATE-PHENYLEPHRINE 0.2-1 % OP SOLN
1.0000 [drp] | OPHTHALMIC | Status: AC
Start: 2014-09-07 — End: 2014-09-07
  Administered 2014-09-07 (×3): 1 [drp] via OPHTHALMIC

## 2014-09-07 MED ORDER — LIDOCAINE HCL (PF) 1 % IJ SOLN
INTRAMUSCULAR | Status: DC | PRN
  Administered 2014-09-07: .6 mL

## 2014-09-07 MED ORDER — POVIDONE-IODINE 5 % OP SOLN
OPHTHALMIC | Status: DC | PRN
  Administered 2014-09-07: 1 via OPHTHALMIC

## 2014-09-07 SURGICAL SUPPLY — 11 items

## 2014-09-07 NOTE — Discharge Instructions (Signed)

## 2014-09-07 NOTE — H&P (Signed)
I have reviewed the H&P, the patient was re-examined, and I have identified no interval changes in medical condition and plan of care since the history and physical of record  

## 2014-09-07 NOTE — Transfer of Care (Signed)
Immediate Anesthesia Transfer of Care Note  Patient: Robin Villa  Procedure(s) Performed: Procedure(s): CATARACT EXTRACTION PHACO AND INTRAOCULAR LENS PLACEMENT RIGHT EYE CDE=6.10 (Right)  Patient Location: Short Stay  Anesthesia Type:MAC  Level of Consciousness: awake, alert , oriented and patient cooperative  Airway & Oxygen Therapy: Patient Spontanous Breathing  Post-op Assessment: Report given to RN, Post -op Vital signs reviewed and stable and Patient moving all extremities  Post vital signs: Reviewed and stable  Last Vitals:  Filed Vitals:   09/07/14 0800  BP: 115/50  Pulse:   Temp:   Resp: 17    Complications: No apparent anesthesia complications

## 2014-09-07 NOTE — Anesthesia Postprocedure Evaluation (Signed)
  Anesthesia Post-op Note  Patient: Robin Villa  Procedure(s) Performed: Procedure(s): CATARACT EXTRACTION PHACO AND INTRAOCULAR LENS PLACEMENT RIGHT EYE CDE=6.10 (Right)  Patient Location: Short Stay  Anesthesia Type:MAC  Level of Consciousness: awake, alert , oriented and patient cooperative  Airway and Oxygen Therapy: Patient Spontanous Breathing  Post-op Pain: none  Post-op Assessment: Post-op Vital signs reviewed, Patient's Cardiovascular Status Stable, Respiratory Function Stable, Patent Airway, Adequate PO intake and Pain level controlled              Post-op Vital Signs: Reviewed and stable  Last Vitals:  Filed Vitals:   09/07/14 0800  BP: 115/50  Pulse:   Temp:   Resp: 17    Complications: No apparent anesthesia complications

## 2014-09-07 NOTE — Op Note (Signed)
Date of Admission: 09/07/2014  Date of Surgery: 09/07/2014   Pre-Op Dx: Cataract Right Eye  Post-Op Dx: Senile Combined Cataract Right  Eye,  Dx Code T01.601  Surgeon: Tonny Branch, M.D.  Assistants: None  Anesthesia: Topical with MAC  Indications: Painless, progressive loss of vision with compromise of daily activities.  Surgery: Cataract Extraction with Intraocular lens Implant Right Eye  Discription: The patient had dilating drops and viscous lidocaine placed into the Right eye in the pre-op holding area. After transfer to the operating room, a time out was performed. The patient was then prepped and draped. Beginning with a 74 degree blade a paracentesis port was made at the surgeon's 2 o'clock position. The anterior chamber was then filled with 1% non-preserved lidocaine. This was followed by filling the anterior chamber with Provisc.  A 2.72mm keratome blade was used to make a clear corneal incision at the temporal limbus.  A bent cystatome needle was used to create a continuous tear capsulotomy. Hydrodissection was performed with balanced salt solution on a Fine canula. The lens nucleus was then removed using the phacoemulsification handpiece. Residual cortex was removed with the I&A handpiece. The anterior chamber and capsular bag were refilled with Provisc. A posterior chamber intraocular lens was placed into the capsular bag with it's injector. The implant was positioned with the Kuglan hook. The Provisc was then removed from the anterior chamber and capsular bag with the I&A handpiece. Stromal hydration of the main incision and paracentesis port was performed with BSS on a Fine canula. The wounds were tested for leak which was negative. The patient tolerated the procedure well. There were no operative complications. The patient was then transferred to the recovery room in stable condition.  Complications: None  Specimen: None  EBL: None  Prosthetic device: Hoya iSert 250, power 20.0 D,  SN [id number].

## 2014-09-07 NOTE — Anesthesia Preprocedure Evaluation (Addendum)
Anesthesia Evaluation  Patient identified by MRN, date of birth, ID band Patient awake    Reviewed: Allergy & Precautions, NPO status , Patient's Chart, lab work & pertinent test results  Airway Mallampati: II  TM Distance: >3 FB     Dental  (+) Edentulous Upper, Edentulous Lower   Pulmonary former smoker,  breath sounds clear to auscultation        Cardiovascular hypertension, Pt. on medications Rhythm:Regular Rate:Normal     Neuro/Psych    GI/Hepatic negative GI ROS,   Endo/Other    Renal/GU      Musculoskeletal   Abdominal   Peds  Hematology   Anesthesia Other Findings   Reproductive/Obstetrics                            Anesthesia Physical Anesthesia Plan  ASA: II  Anesthesia Plan: MAC   Post-op Pain Management:    Induction: Intravenous  Airway Management Planned: Nasal Cannula  Additional Equipment:   Intra-op Plan:   Post-operative Plan:   Informed Consent: I have reviewed the patients History and Physical, chart, labs and discussed the procedure including the risks, benefits and alternatives for the proposed anesthesia with the patient or authorized representative who has indicated his/her understanding and acceptance.     Plan Discussed with:   Anesthesia Plan Comments:         Anesthesia Quick Evaluation

## 2014-09-08 ENCOUNTER — Encounter (HOSPITAL_COMMUNITY): Payer: Self-pay | Admitting: Ophthalmology

## 2014-09-14 DIAGNOSIS — H25812 Combined forms of age-related cataract, left eye: Secondary | ICD-10-CM | POA: Diagnosis not present

## 2015-01-27 ENCOUNTER — Encounter: Payer: BC Managed Care – PPO | Admitting: Family Medicine

## 2015-01-28 ENCOUNTER — Ambulatory Visit (INDEPENDENT_AMBULATORY_CARE_PROVIDER_SITE_OTHER): Payer: BC Managed Care – PPO | Admitting: Family Medicine

## 2015-01-28 ENCOUNTER — Encounter: Payer: Self-pay | Admitting: Family Medicine

## 2015-01-28 ENCOUNTER — Telehealth: Payer: Self-pay | Admitting: Family Medicine

## 2015-01-28 VITALS — BP 138/72 | HR 78 | Temp 98.3°F | Resp 14 | Ht 67.0 in | Wt 192.0 lb

## 2015-01-28 DIAGNOSIS — G4700 Insomnia, unspecified: Secondary | ICD-10-CM | POA: Diagnosis not present

## 2015-01-28 DIAGNOSIS — Z818 Family history of other mental and behavioral disorders: Secondary | ICD-10-CM | POA: Diagnosis not present

## 2015-01-28 DIAGNOSIS — J069 Acute upper respiratory infection, unspecified: Secondary | ICD-10-CM

## 2015-01-28 MED ORDER — TRAZODONE HCL 50 MG PO TABS
25.0000 mg | ORAL_TABLET | Freq: Every evening | ORAL | Status: DC | PRN
Start: 2015-01-28 — End: 2015-08-18

## 2015-01-28 MED ORDER — AMOXICILLIN 875 MG PO TABS: 875.0000 mg | ORAL_TABLET | Freq: Two times a day (BID) | ORAL | Status: DC

## 2015-01-28 MED ORDER — LISINOPRIL-HYDROCHLOROTHIAZIDE 20-25 MG PO TABS: 1.0000 | ORAL_TABLET | Freq: Every day | ORAL | Status: DC

## 2015-01-28 NOTE — Telephone Encounter (Signed)
[facility] Robin Villa  Patient forgot she needs refill on her lisinopril

## 2015-01-28 NOTE — Patient Instructions (Signed)
Use nasal saline Mucinex Take antibiotics F/U as previous

## 2015-01-28 NOTE — Progress Notes (Signed)
Patient ID: Robin Villa, female   DOB: Mar 23, 1950, 64 y.o.   MRN: [id number]   Subjective:    Patient ID: Robin Villa, female    DOB: February 02, 1950, 64 y.o.   MRN: [id number]  Patient presents for Illness  patient here with sinus pressure or drainage cough with mild production for the past week. Seems to be worsening. The sinus pressure seems to be the worse. She has been using over-the-counter congestion medicine with minimal improvement. She's been using her Clarinex as well.  At the end of the visit she states that her son recently separated from her wife and things of been very stressful around the home this holiday season. She has not been sleeping very well for the past couple months. She will like to try something and help her sleep.    Review Of Systems:  GEN- denies fatigue, fever, weight loss,weakness, recent illness HEENT- denies eye drainage, change in vision, nasal discharge, CVS- denies chest pain, palpitations RESP- denies SOB, cough, wheeze ABD- denies N/V, change in stools, abd pain GU- denies dysuria, hematuria, dribbling, incontinence MSK- denies joint pain, muscle aches, injury Neuro- denies headache, dizziness, syncope, seizure activity       Objective:    BP 138/72 mmHg  Pulse 78  Temp(Src) 98.3 F (36.8 C) (Oral)  Resp 14  Ht 5\' 7"  (1.702 m)  Wt 192 lb (87.091 kg)  BMI 30.06 kg/m2 GEN- NAD, alert and oriented x3 HEENT- PERRL, EOMI, non injected sclera, pink conjunctiva, MMM, oropharynx mild injection, TM clear bilat no effusion,  + maxillary sinus tenderness, inflammed turbinates,  Nasal drainage  Neck- Supple, no LAD CVS- RRR, no murmur RESP-CTAB Psych- normal affect and mood EXT- No edema Pulses- Radial 2+         Assessment & Plan:      Problem List Items Addressed This Visit    None    Visit Diagnoses    Acute URI    -  Primary    antibiotics, nasal saline, mucinex DM    Insomnia        Trazadone at bedtime    Family history of stress            Note: This dictation was prepared with Dragon dictation along with smaller Company secretary. Any transcriptional errors that result from this process are unintentional.

## 2015-01-28 NOTE — Telephone Encounter (Signed)
Prescription sent to pharmacy.

## 2015-01-29 ENCOUNTER — Encounter: Payer: Self-pay | Admitting: Family Medicine

## 2015-02-23 ENCOUNTER — Ambulatory Visit (INDEPENDENT_AMBULATORY_CARE_PROVIDER_SITE_OTHER): Payer: BC Managed Care – PPO | Admitting: Family Medicine

## 2015-02-23 ENCOUNTER — Encounter: Payer: Self-pay | Admitting: Family Medicine

## 2015-02-23 VITALS — BP 138/80 | HR 88 | Temp 98.8°F | Resp 16 | Ht 67.0 in | Wt 193.0 lb

## 2015-02-23 DIAGNOSIS — E785 Hyperlipidemia, unspecified: Secondary | ICD-10-CM

## 2015-02-23 DIAGNOSIS — K219 Gastro-esophageal reflux disease without esophagitis: Secondary | ICD-10-CM | POA: Diagnosis not present

## 2015-02-23 DIAGNOSIS — Z23 Encounter for immunization: Secondary | ICD-10-CM | POA: Diagnosis not present

## 2015-02-23 DIAGNOSIS — Z0000 Encounter for general adult medical examination without abnormal findings: Secondary | ICD-10-CM

## 2015-02-23 DIAGNOSIS — R0989 Other specified symptoms and signs involving the circulatory and respiratory systems: Secondary | ICD-10-CM | POA: Diagnosis not present

## 2015-02-23 DIAGNOSIS — E669 Obesity, unspecified: Secondary | ICD-10-CM | POA: Diagnosis not present

## 2015-02-23 DIAGNOSIS — I10 Essential (primary) hypertension: Secondary | ICD-10-CM | POA: Diagnosis not present

## 2015-02-23 MED ORDER — RANITIDINE HCL 150 MG PO TABS: 150.0000 mg | ORAL_TABLET | Freq: Every day | ORAL | Status: DC

## 2015-02-23 NOTE — Patient Instructions (Signed)
Call for Bone Density  Ultrasound to be done on arteries  Continue current meds Pneumonia shot given  F/U 4 months

## 2015-02-23 NOTE — Addendum Note (Signed)
Addended by: Sheral Flow on: 02/23/2015 03:31 PM   Modules accepted: Orders

## 2015-02-23 NOTE — Addendum Note (Signed)
Addended by: Vic Blackbird F on: 02/23/2015 03:00 PM   Modules accepted: Orders

## 2015-02-23 NOTE — Progress Notes (Signed)
Patient ID: Robin Villa, female   DOB: May 25, 1950, 65 y.o.   MRN: [id number]   Subjective:    Patient ID: Robin Villa, female    DOB: [birth year], 65 y.o.   MRN: [id number]  Patient presents for Medicare CPE  patient here for complete physical exam. Mammogram up-to-date colonoscopy up-to-date   Due for shingles vaccine and Pneumovax 23, flu shot   due for bone density for osteoporosis screening    He is due for repeat fasting labs for cholesterol she is currently on simvastatin 20 mg    Hypertension taking medication as prescribed no side effects.    Due for Pap smear last in 2013    She's been having heartburn for quite some time. She's been trying over-the-counter remedies as well as home remedies of taking  vinegar. She will like to try something stronger. She tries still with the foods that cause her have heartburn and reflux symptoms. No nausea vomiting  Review Of Systems:  GEN- denies fatigue, fever, weight loss,weakness, recent illness HEENT- denies eye drainage, change in vision, nasal discharge, CVS- denies chest pain, palpitations RESP- denies SOB, cough, wheeze ABD- denies N/V, change in stools, abd pain GU- denies dysuria, hematuria, dribbling, incontinence MSK- denies joint pain, muscle aches, injury Neuro- denies headache, dizziness, syncope, seizure activity       Objective:    BP 138/80 mmHg  Pulse 88  Temp(Src) 98.8 F (37.1 C) (Oral)  Resp 16  Ht 5\' 7"  (1.702 m)  Wt 193 lb (87.544 kg)  BMI 30.22 kg/m2 GEN- NAD, alert and oriented x3 HEENT- PERRL, EOMI, non injected sclera, pink conjunctiva, MMM, oropharynx clear Neck- Supple, no thyromegaly, +bruit right  CVS- RRR, no murmur RESP-CTAB ABD-NABS,soft,NT,ND GU- Declined EXT- No edema Pulses- Radial, DP- 2+        Assessment & Plan:      Problem List Items Addressed This Visit    Obesity   Hyperlipidemia   Relevant Orders   Lipid panel   Essential hypertension, benign    Well controlled no change  to meds      Relevant Orders   CBC with Differential/Platelet   Comprehensive metabolic panel   TSH    Other Visit Diagnoses    Routine general medical examination at a health care facility    -  Primary    CPE done, needs PAP but declines today, Pneumo Vax 23 and Flu given, plan for shingles shot at next visit. Fasting labs, Bone density to be done    Right carotid bruit        Carotid US to be done, check lipids on zocor     Gastroesophageal reflux disease without esophagitis        Reflux symptoms, trial of zantac     Relevant Medications    ranitidine (ZANTAC) 150 MG tablet       Note: This dictation was prepared with Dragon dictation along with smaller phrase technology. Any transcriptional errors that result from this process are unintentional.

## 2015-02-23 NOTE — Assessment & Plan Note (Signed)
Well controlled no change to meds 

## 2015-02-24 LAB — CBC WITH DIFFERENTIAL/PLATELET
Basophils Absolute: 0 10*3/uL (ref 0.0–0.1)
Basophils Relative: 0 % (ref 0–1)
EOS ABS: 0.3 10*3/uL (ref 0.0–0.7)
EOS PCT: 3 % (ref 0–5)
HCT: 36 % (ref 36.0–46.0)
Hemoglobin: 12 g/dL (ref 12.0–15.0)
LYMPHS PCT: 20 % (ref 12–46)
Lymphs Abs: 1.8 10*3/uL (ref 0.7–4.0)
MCH: 28.6 pg (ref 26.0–34.0)
MCHC: 33.3 g/dL (ref 30.0–36.0)
MCV: 85.9 fL (ref 78.0–100.0)
MPV: 11.6 fL (ref 8.6–12.4)
Monocytes Absolute: 0.7 10*3/uL (ref 0.1–1.0)
Monocytes Relative: 8 % (ref 3–12)
Neutro Abs: 6.1 10*3/uL (ref 1.7–7.7)
Neutrophils Relative %: 69 % (ref 43–77)
PLATELETS: 253 10*3/uL (ref 150–400)
RBC: 4.19 MIL/uL (ref 3.87–5.11)
RDW: 13.4 % (ref 11.5–15.5)
WBC: 8.9 10*3/uL (ref 4.0–10.5)

## 2015-02-24 LAB — LIPID PANEL
CHOL/HDL RATIO: 4 ratio (ref <=5.0)
Cholesterol: 119 mg/dL — ABNORMAL LOW (ref 125–200)
HDL: 30 mg/dL — AB (ref >=46)
LDL Cholesterol: 64 mg/dL (ref <130)
Triglycerides: 124 mg/dL (ref <150)
VLDL: 25 mg/dL (ref <30)

## 2015-02-24 LAB — COMPREHENSIVE METABOLIC PANEL
ALT: 26 U/L (ref 6–29)
AST: 24 U/L (ref 10–35)
Albumin: 4 g/dL (ref 3.6–5.1)
Alkaline Phosphatase: 65 U/L (ref 33–130)
BUN: 20 mg/dL (ref 7–25)
CALCIUM: 9.3 mg/dL (ref 8.6–10.4)
CO2: 23 mmol/L (ref 20–31)
Chloride: 105 mmol/L (ref 98–110)
Creat: 1.24 mg/dL — ABNORMAL HIGH (ref 0.50–0.99)
GLUCOSE: 77 mg/dL (ref 70–99)
Potassium: 4.7 mmol/L (ref 3.5–5.3)
Sodium: 138 mmol/L (ref 135–146)
Total Bilirubin: 0.3 mg/dL (ref 0.2–1.2)
Total Protein: 6.8 g/dL (ref 6.1–8.1)

## 2015-02-24 LAB — TSH: TSH: 1.097 u[IU]/mL (ref 0.350–4.500)

## 2015-03-17 ENCOUNTER — Ambulatory Visit (HOSPITAL_COMMUNITY)
Admission: RE | Admit: 2015-03-17 | Discharge: 2015-03-17 | Disposition: A | Discharge status: Home / Self Care | Payer: BC Managed Care – PPO | Source: Ambulatory Visit | Attending: Family Medicine | Admitting: Family Medicine

## 2015-03-17 DIAGNOSIS — R0989 Other specified symptoms and signs involving the circulatory and respiratory systems: Secondary | ICD-10-CM | POA: Insufficient documentation

## 2015-03-17 DIAGNOSIS — I6523 Occlusion and stenosis of bilateral carotid arteries: Secondary | ICD-10-CM | POA: Insufficient documentation

## 2015-03-17 DIAGNOSIS — Z87891 Personal history of nicotine dependence: Secondary | ICD-10-CM | POA: Insufficient documentation

## 2015-03-17 DIAGNOSIS — I10 Essential (primary) hypertension: Secondary | ICD-10-CM | POA: Insufficient documentation

## 2015-03-22 ENCOUNTER — Other Ambulatory Visit: Payer: Self-pay | Admitting: *Deleted

## 2015-03-22 MED ORDER — ASPIRIN 81 MG PO TABS: 81.0000 mg | ORAL_TABLET | Freq: Every day | ORAL | Status: AC

## 2015-05-12 ENCOUNTER — Ambulatory Visit (INDEPENDENT_AMBULATORY_CARE_PROVIDER_SITE_OTHER): Payer: Medicare Other | Admitting: Family Medicine

## 2015-05-12 ENCOUNTER — Encounter: Payer: Self-pay | Admitting: Family Medicine

## 2015-05-12 VITALS — BP 136/80 | HR 82 | Temp 97.8°F | Resp 12 | Ht 67.0 in | Wt 192.0 lb

## 2015-05-12 DIAGNOSIS — K219 Gastro-esophageal reflux disease without esophagitis: Secondary | ICD-10-CM | POA: Diagnosis not present

## 2015-05-12 MED ORDER — ESOMEPRAZOLE MAGNESIUM 40 MG PO CPDR: 40.0000 mg | DELAYED_RELEASE_CAPSULE | Freq: Every day | ORAL | Status: DC

## 2015-05-12 NOTE — Progress Notes (Signed)
Patient ID: Robin Villa, female   DOB: [birth year], 65 y.o.   MRN: [id number]   Subjective:    Patient ID: Robin Villa, female    DOB: [birth year], 65 y.o.   MRN: [id number]  Patient presents for Acid Refux  Patient with worsening acid reflux. She to hotdogs a week or so ago began having significant heartburn. She's had heartburn in the past. We had her on Zantac 150 mg at bedtime and this was no longer helping. She's also had some fried foods and things of marinara sauce that is also worsening. She also tried taking some apple cider vinegar which helped some of her symptoms. She's had some mild nausea and has been belching. She denies any radiating chest pain denies any shortness of breath no recent illness.   Review Of Systems:  GEN- denies fatigue, fever, weight loss,weakness, recent illness HEENT- denies eye drainage, change in vision, nasal discharge, CVS- denies chest pain, palpitations RESP- denies SOB, cough, wheeze ABD- denies N/V, change in stools, abd pain GU- denies dysuria, hematuria, dribbling, incontinence MSK- denies joint pain, muscle aches, injury Neuro- denies headache, dizziness, syncope, seizure activity       Objective:    BP 136/80 mmHg  Pulse 82  Temp(Src) 97.8 F (36.6 C) (Oral)  Resp 12  Ht 5\' 7"  (1.702 m)  Wt 192 lb (87.091 kg)  BMI 30.06 kg/m2 GEN- NAD, alert and oriented x3 HEENT- PERRL, EOMI, non injected sclera, pink conjunctiva, MMM, oropharynx clear CVS- RRR, no murmur RESP-CTAB ABD-NABS,soft,NT,ND Pulses- Radial  2+        Assessment & Plan:      Problem List Items Addressed This Visit    GERD (gastroesophageal reflux disease) - Primary    She was given Nexium 40 mg in the office. I will continue her with Nexium 40 mg for the next 7 days. Discussed foods to avoid. She also minimizes anti-inflammatories. No red flags      Relevant Medications   esomeprazole (NEXIUM) 40 MG capsule      Note: This dictation was prepared with Dragon  dictation along with smaller phrase technology. Any transcriptional errors that result from this process are unintentional.

## 2015-05-12 NOTE — Assessment & Plan Note (Signed)
She was given Nexium 40 mg in the office. I will continue her with Nexium 40 mg for the next 7 days. Discussed foods to avoid. She also minimizes anti-inflammatories. No red flags

## 2015-05-12 NOTE — Patient Instructions (Signed)
Take the nexium as prescribed - 2 pills a day F/U as needed

## 2015-08-18 ENCOUNTER — Ambulatory Visit (INDEPENDENT_AMBULATORY_CARE_PROVIDER_SITE_OTHER): Payer: BC Managed Care – PPO | Admitting: Family Medicine

## 2015-08-18 ENCOUNTER — Encounter: Payer: Self-pay | Admitting: Family Medicine

## 2015-08-18 VITALS — BP 138/82 | HR 60 | Temp 99.1°F | Resp 14 | Ht 67.0 in | Wt 192.0 lb

## 2015-08-18 DIAGNOSIS — N182 Chronic kidney disease, stage 2 (mild): Secondary | ICD-10-CM

## 2015-08-18 DIAGNOSIS — E785 Hyperlipidemia, unspecified: Secondary | ICD-10-CM

## 2015-08-18 DIAGNOSIS — I10 Essential (primary) hypertension: Secondary | ICD-10-CM | POA: Diagnosis not present

## 2015-08-18 DIAGNOSIS — N183 Chronic kidney disease, stage 3 unspecified: Secondary | ICD-10-CM | POA: Insufficient documentation

## 2015-08-18 DIAGNOSIS — M659 Synovitis and tenosynovitis, unspecified: Secondary | ICD-10-CM

## 2015-08-18 DIAGNOSIS — E669 Obesity, unspecified: Secondary | ICD-10-CM | POA: Diagnosis not present

## 2015-08-18 DIAGNOSIS — K219 Gastro-esophageal reflux disease without esophagitis: Secondary | ICD-10-CM

## 2015-08-18 DIAGNOSIS — M778 Other enthesopathies, not elsewhere classified: Secondary | ICD-10-CM

## 2015-08-18 DIAGNOSIS — Z1159 Encounter for screening for other viral diseases: Secondary | ICD-10-CM

## 2015-08-18 LAB — CBC WITH DIFFERENTIAL/PLATELET
BASOS ABS: 0 {cells}/uL (ref 0–200)
Basophils Relative: 0 %
EOS ABS: 146 {cells}/uL (ref 15–500)
Eosinophils Relative: 2 %
HEMATOCRIT: 35.6 % (ref 35.0–45.0)
HEMOGLOBIN: 11.7 g/dL — AB (ref 12.0–15.0)
Lymphocytes Relative: 14 %
Lymphs Abs: 1022 cells/uL (ref 850–3900)
MCH: 28.4 pg (ref 27.0–33.0)
MCHC: 32.9 g/dL (ref 32.0–36.0)
MCV: 86.4 fL (ref 80.0–100.0)
MONO ABS: 657 {cells}/uL (ref 200–950)
MPV: 11.3 fL (ref 7.5–12.5)
Monocytes Relative: 9 %
Neutro Abs: 5475 cells/uL (ref 1500–7800)
Neutrophils Relative %: 75 %
Platelets: 264 10*3/uL (ref 140–400)
RBC: 4.12 MIL/uL (ref 3.80–5.10)
RDW: 13.7 % (ref 11.0–15.0)
WBC: 7.3 10*3/uL (ref 3.8–10.8)

## 2015-08-18 MED ORDER — LISINOPRIL-HYDROCHLOROTHIAZIDE 20-25 MG PO TABS: 1.0000 | ORAL_TABLET | Freq: Every day | ORAL | Status: DC

## 2015-08-18 MED ORDER — SIMVASTATIN 20 MG PO TABS: 20.0000 mg | ORAL_TABLET | Freq: Every day | ORAL | Status: DC

## 2015-08-18 MED ORDER — ESOMEPRAZOLE MAGNESIUM 40 MG PO CPDR: 40.0000 mg | DELAYED_RELEASE_CAPSULE | Freq: Every day | ORAL | Status: DC

## 2015-08-18 NOTE — Assessment & Plan Note (Signed)
Check renal function. 

## 2015-08-18 NOTE — Assessment & Plan Note (Signed)
Blood pressure is controlled medication medication cholesterol also good control

## 2015-08-18 NOTE — Patient Instructions (Signed)
F/u 6 MONTHS for Physical  We will call with lab results Schedule your bone density

## 2015-08-18 NOTE — Assessment & Plan Note (Signed)
Doing well with Nexium. She does use some intake for her arthritis of her knee she also has some mild tendinitis in her arm device sees more the topical rubs

## 2015-08-18 NOTE — Progress Notes (Signed)
Patient ID: Robin Villa, female   DOB: 1950/03/10, 65 y.o.   MRN: [id number]   Subjective:    Patient ID: Robin Villa, female    DOB: 19-Mar-1950, 65 y.o.   MRN: [id number]  Patient presents for 3 Month med ck Patient here to follow-up medications. She is currently on treatment for hypertension and hyperlipidemia. He had physical exam back in January. Weight has been stable she is obese Medications were reviewed. We've also been following her renal function she has some mild chronic kidney disease  She does take ibuprofen occasionally for arthritis in her knee she does not want intervention at this time. She is also lifting her mother who is dead weight and she's had some discomfort in her left elbow and lower one third of the arm. She's not noticed any swelling no bruising typically only hurts when she is trying to lift her mother. She's been using topical anti-inflammatory rubs which help  No tingling or numbess in hands Review Of Systems:  GEN- denies fatigue, fever, weight loss,weakness, recent illness HEENT- denies eye drainage, change in vision, nasal discharge, CVS- denies chest pain, palpitations RESP- denies SOB, cough, wheeze ABD- denies N/V, change in stools, abd pain GU- denies dysuria, hematuria, dribbling, incontinence MSK- + joint pain, muscle aches, injury Neuro- denies headache, dizziness, syncope, seizure activity       Objective:    BP 138/82 mmHg  Pulse 60  Temp(Src) 99.1 F (37.3 C) (Oral)  Resp 14  Ht 5\' 7"  (1.702 m)  Wt 192 lb (87.091 kg)  BMI 30.06 kg/m2 GEN- NAD, alert and oriented x3 HEENT- PERRL, EOMI, non injected sclera, pink conjunctiva, MMM, oropharynx clear Neck- Supple,  CVS- RRR, no murmur RESP-CTAB MSK- normal inspection arms, elbows, rotator cuff in tact, biceps in tact, NT over epicondyles left elbow, strength equal bilat  EXT- No edema Pulses- Radial - 2+        Assessment & Plan:      Problem List Items Addressed This Visit    Obesity   Hyperlipidemia   Relevant Medications   lisinopril-hydrochlorothiazide (PRINZIDE,ZESTORETIC) 20-25 MG tablet   simvastatin (ZOCOR) 20 MG tablet   GERD (gastroesophageal reflux disease)    Doing well with Nexium. She does use some intake for her arthritis of her knee she also has some mild tendinitis in her arm device sees more the topical rubs      Relevant Medications   esomeprazole (NEXIUM) 40 MG capsule   Essential hypertension, benign - Primary    Blood pressure is controlled medication medication cholesterol also good control      Relevant Medications   lisinopril-hydrochlorothiazide (PRINZIDE,ZESTORETIC) 20-25 MG tablet   simvastatin (ZOCOR) 20 MG tablet   Other Relevant Orders   COMPLETE METABOLIC PANEL WITH GFR   CBC with Differential/Platelet   CKD (chronic kidney disease), stage II    Check renal function      Relevant Orders   COMPLETE METABOLIC PANEL WITH GFR    Other Visit Diagnoses    Need for hepatitis C screening test        Relevant Orders    Hepatitis C Antibody    Tendinitis of left elbow        Mild tendnitis symptoms of elbow, upper arm , do to overuse and repettive movements, not quite epicondylitis. Tooical anti-inflamatory, occ ibuprofen, ICE        Note: This dictation was prepared with Dragon dictation along with smaller phrase technology. Any transcriptional errors that result  from this process are unintentional.

## 2015-08-19 LAB — COMPLETE METABOLIC PANEL WITH GFR
ALT: 13 U/L (ref 6–29)
AST: 16 U/L (ref 10–35)
Albumin: 4 g/dL (ref 3.6–5.1)
Alkaline Phosphatase: 55 U/L (ref 33–130)
BILIRUBIN TOTAL: 0.3 mg/dL (ref 0.2–1.2)
BUN: 20 mg/dL (ref 7–25)
CO2: 21 mmol/L (ref 20–31)
Calcium: 9.5 mg/dL (ref 8.6–10.4)
Chloride: 104 mmol/L (ref 98–110)
Creat: 1.31 mg/dL — ABNORMAL HIGH (ref 0.50–0.99)
GFR, Est African American: 49 mL/min — ABNORMAL LOW (ref >=60)
GFR, Est Non African American: 42 mL/min — ABNORMAL LOW (ref >=60)
GLUCOSE: 95 mg/dL (ref 70–99)
Potassium: 5 mmol/L (ref 3.5–5.3)
Sodium: 138 mmol/L (ref 135–146)
TOTAL PROTEIN: 7.4 g/dL (ref 6.1–8.1)

## 2015-08-19 LAB — HEPATITIS C ANTIBODY: HCV Ab: NEGATIVE

## 2015-10-19 ENCOUNTER — Encounter: Payer: Self-pay | Admitting: Orthopedic Surgery

## 2015-10-19 ENCOUNTER — Ambulatory Visit (INDEPENDENT_AMBULATORY_CARE_PROVIDER_SITE_OTHER): Payer: BC Managed Care – PPO | Admitting: Orthopedic Surgery

## 2015-10-19 ENCOUNTER — Ambulatory Visit (INDEPENDENT_AMBULATORY_CARE_PROVIDER_SITE_OTHER): Payer: BC Managed Care – PPO

## 2015-10-19 VITALS — BP 121/66 | HR 76 | Ht 66.0 in | Wt 193.0 lb

## 2015-10-19 DIAGNOSIS — M129 Arthropathy, unspecified: Secondary | ICD-10-CM | POA: Diagnosis not present

## 2015-10-19 DIAGNOSIS — M25561 Pain in right knee: Secondary | ICD-10-CM

## 2015-10-19 DIAGNOSIS — M1711 Unilateral primary osteoarthritis, right knee: Secondary | ICD-10-CM

## 2015-10-19 NOTE — Patient Instructions (Addendum)
You have received an injection of steroids into the joint. 15% of patients will have increased pain within the 24 hours postinjection.   This is transient and will go away.   We recommend that you use ice packs on the injection site for 20 minutes every 2 hours and extra strength Tylenol 2 tablets every 8 as needed until the pain resolves.  If you continue to have pain after taking the Tylenol and using the ice please call the office for further instructions.  Arthritis Arthritis is a term that is commonly used to refer to joint pain or joint disease. There are more than 100 types of arthritis. CAUSES The most common cause of this condition is wear and tear of a joint. Other causes include:  Gout.  Inflammation of a joint.  An infection of a joint.  Sprains and other injuries near the joint.  A drug reaction or allergic reaction. In some cases, the cause may not be known. SYMPTOMS The main symptom of this condition is pain in the joint with movement. Other symptoms include:  Redness, swelling, or stiffness at a joint.  Warmth coming from the joint.  Fever.  Overall feeling of illness. DIAGNOSIS This condition may be diagnosed with a physical exam and tests, including:  Blood tests.  Urine tests.  Imaging tests, such as MRI, X-rays, or a CT scan. Sometimes, fluid is removed from a joint for testing. TREATMENT Treatment for this condition may involve:  Treatment of the cause, if it is known.  Rest.  Raising (elevating) the joint.  Applying cold or hot packs to the joint.  Medicines to improve symptoms and reduce inflammation.  Injections of a steroid such as cortisone into the joint to help reduce pain and inflammation. Depending on the cause of your arthritis, you may need to make lifestyle changes to reduce stress on your joint. These changes may include exercising more and losing weight. HOME CARE INSTRUCTIONS Medicines  Take over-the-counter and  prescription medicines only as told by your health care provider.  Do not take aspirin to relieve pain if gout is suspected. Activities  Rest your joint if told by your health care provider. Rest is important when your disease is active and your joint feels painful, swollen, or stiff.  Avoid activities that make the pain worse. It is important to balance activity with rest.  Exercise your joint regularly with range-of-motion exercises as told by your health care provider. Try doing low-impact exercise, such as:  Swimming.  Water aerobics.  Biking.  Walking. Joint Care  If your joint is swollen, keep it elevated if told by your health care provider.  If your joint feels stiff in the morning, try taking a warm shower.  If directed, apply heat to the joint. If you have diabetes, do not apply heat without permission from your health care provider.  Put a towel between the joint and the hot pack or heating pad.  Leave the heat on the area for 20-30 minutes.  If directed, apply ice to the joint:  Put ice in a plastic bag.  Place a towel between your skin and the bag.  Leave the ice on for 20 minutes, 2-3 times per day.  Keep all follow-up visits as told by your health care provider. This is important. SEEK MEDICAL CARE IF:  The pain gets worse.  You have a fever. SEEK IMMEDIATE MEDICAL CARE IF:  You develop severe joint pain, swelling, or redness.  Many joints become painful and  swollen.  You develop severe back pain.  You develop severe weakness in your leg.  You cannot control your bladder or bowels.   This information is not intended to replace advice given to you by your health care provider. Make sure you discuss any questions you have with your health care provider.   Document Released: 02/24/2004 Document Revised: 10/07/2014 Document Reviewed: 04/13/2014 Elsevier Interactive Patient Education [employer].

## 2015-10-19 NOTE — Progress Notes (Signed)
Chief Complaint  Patient presents with  . Knee Pain    right knee pain   HPI Right knee pain and swelling. No trauma. Pain 5 out of 10. Pain worse with activity. Pain described as aching. Associated with swelling. She does get some relief with ibuprofen but it hurts more if she does a lot of walking. She did deny catching locking stiffness giving way numbness tingling of throbbing burning as related symptoms.   Review of Systems  HENT: Positive for congestion.   Cardiovascular: Positive for leg swelling.  Musculoskeletal: Positive for back pain.       Joint swelling and stiffness  Endo/Heme/Allergies: Positive for environmental allergies.  All other systems reviewed and are negative.   Past Medical History:  Diagnosis Date  . Arthritis   . Heart murmur    asymptomatic  . Hypercholesteremia   . Hypertension     Past Surgical History:  Procedure Laterality Date  . CARPAL TUNNEL RELEASE     bilateral   . CATARACT EXTRACTION W/PHACO Right 09/07/2014   Procedure: CATARACT EXTRACTION PHACO AND INTRAOCULAR LENS PLACEMENT RIGHT EYE CDE=6.10;  Surgeon: Tonny Branch, MD;  Location: [facility];  Service: Ophthalmology;  Laterality: Right;  . COLONOSCOPY  09/15/2011   Procedure: COLONOSCOPY;  Surgeon: Danie Binder, MD;  Location: [facility];  Service: Endoscopy;  Laterality: N/A;  11:00AM  . TUBAL LIGATION     Family History  Problem Relation Age of Onset  . Heart disease Father   . Stroke Father   . Hypertension Sister   . Hypertension Sister   . Heart disease Brother   . Hypertension Mother    Social History  Substance Use Topics  . Smoking status: Former Smoker    Packs/day: 0.30    Years: 35.00    Types: Cigarettes    Quit date: 08/22/1996  . Smokeless tobacco: Never Used  . Alcohol use No   Current Meds  Medication Sig  . aspirin 81 MG tablet Take 1 tablet (81 mg total) by mouth daily.  . Calcium Carb-Cholecalciferol (CALCIUM + D3) 600-200 MG-UNIT TABS Take by mouth 2  (two) times daily.  Marland Kitchen esomeprazole (NEXIUM) 40 MG capsule Take 1 capsule (40 mg total) by mouth daily.  Marland Kitchen ibuprofen (ADVIL,MOTRIN) 200 MG tablet Take 400 mg by mouth every 6 (six) hours as needed.  Marland Kitchen lisinopril-hydrochlorothiazide (PRINZIDE,ZESTORETIC) 20-25 MG tablet Take 1 tablet by mouth daily.  . Multiple Vitamins-Minerals (MULTIVITAMIN WITH MINERALS) tablet Take 1 tablet by mouth daily.  . simvastatin (ZOCOR) 20 MG tablet Take 1 tablet (20 mg total) by mouth at bedtime.  . [DISCONTINUED] desloratadine (CLARINEX) 5 MG tablet Take 5 mg by mouth daily.    BP 121/66   Pulse 76   Ht 5\' 6"  (1.676 m)   Wt 193 lb (87.5 kg)   BMI 31.15 kg/m   Physical Exam  Constitutional: She appears well-developed and well-nourished.  Non-toxic appearance. No distress.  Eyes: Conjunctivae, EOM and lids are normal. Right eye exhibits no discharge and no exudate. Left eye exhibits no discharge and no exudate. Right conjunctiva is not injected. Left conjunctiva is not injected.  Neck: Trachea normal, normal range of motion and full passive range of motion without pain. Neck supple. No neck rigidity. Normal range of motion present. No thyroid mass and no thyromegaly present.  Cardiovascular: Intact distal pulses.   Pulses:      Radial pulses are 2+ on the right side, and 2+ on the left side.  Dorsalis pedis pulses are 2+ on the right side, and 2+ on the left side.  Lymphadenopathy:       Right cervical: No superficial cervical adenopathy present.      Left cervical: No superficial cervical adenopathy present.    She has no axillary adenopathy.       Right: No supraclavicular adenopathy present.       Left: No supraclavicular adenopathy present.  Neurological: She is alert. She displays no atrophy and no tremor. No sensory deficit. She exhibits normal muscle tone. Gait normal. She displays no Babinski's sign on the right side. She displays no Babinski's sign on the left side.  Reflex Scores:       Patellar reflexes are 2+ on the right side and 2+ on the left side. Skin: Skin is warm, dry and intact. No abrasion, no bruising, no ecchymosis and no laceration noted. Rash is not nodular. She is not diaphoretic. No erythema.  Psychiatric: She has a normal mood and affect. Her speech is normal and behavior is normal. Judgment and thought content normal. She is attentive.  Vitals reviewed.   Ortho Exam Right knee: Lateral joint line tenderness flexion 110 5 flexion contracture ligament stable manual muscle testing grade 5 strength skin normal distal pulses intact minimal edema normal sensation  Left knee also tender restricted range of motion 115 with flexion contracture 5 ligaments stable manual muscle testing grade 5 skin normal distal pulses intact  ASSESSMENT: My personal interpretation of the images:  Valgus osteoarthritis right knee  Encounter Diagnoses  Name Primary?  . Right knee pain Yes  . Arthritis of right knee      PLAN Injection continue ibuprofen follow-up one year  Procedure note right knee injection verbal consent was obtained to inject right knee joint  Timeout was completed to confirm the site of injection  The medications used were 40 mg of Depo-Medrol and 1% lidocaine 3 cc  Anesthesia was provided by ethyl chloride and the skin was prepped with alcohol.  After cleaning the skin with alcohol a 20-gauge needle was used to inject the right knee joint. There were no complications. A sterile bandage was applied.    Arther Abbott, MD 10/19/2015 2:12 PM  .meds

## 2015-11-11 ENCOUNTER — Encounter: Payer: Self-pay | Admitting: Family Medicine

## 2016-02-14 ENCOUNTER — Encounter: Payer: Self-pay | Admitting: Family Medicine

## 2016-03-24 ENCOUNTER — Encounter: Payer: Self-pay | Admitting: Family Medicine

## 2016-05-13 ENCOUNTER — Other Ambulatory Visit: Payer: Self-pay | Admitting: Family Medicine

## 2016-05-31 ENCOUNTER — Ambulatory Visit (INDEPENDENT_AMBULATORY_CARE_PROVIDER_SITE_OTHER): Payer: BC Managed Care – PPO | Admitting: Family Medicine

## 2016-05-31 ENCOUNTER — Encounter: Payer: Self-pay | Admitting: Family Medicine

## 2016-05-31 ENCOUNTER — Other Ambulatory Visit: Payer: Self-pay | Admitting: Family Medicine

## 2016-05-31 VITALS — BP 124/68 | HR 80 | Temp 98.5°F | Resp 14 | Ht 66.0 in | Wt 194.0 lb

## 2016-05-31 DIAGNOSIS — I739 Peripheral vascular disease, unspecified: Secondary | ICD-10-CM

## 2016-05-31 DIAGNOSIS — N182 Chronic kidney disease, stage 2 (mild): Secondary | ICD-10-CM

## 2016-05-31 DIAGNOSIS — E7800 Pure hypercholesterolemia, unspecified: Secondary | ICD-10-CM | POA: Diagnosis not present

## 2016-05-31 DIAGNOSIS — Z1231 Encounter for screening mammogram for malignant neoplasm of breast: Secondary | ICD-10-CM

## 2016-05-31 DIAGNOSIS — Z23 Encounter for immunization: Secondary | ICD-10-CM | POA: Diagnosis not present

## 2016-05-31 DIAGNOSIS — Z0000 Encounter for general adult medical examination without abnormal findings: Secondary | ICD-10-CM | POA: Diagnosis not present

## 2016-05-31 DIAGNOSIS — Z780 Asymptomatic menopausal state: Secondary | ICD-10-CM

## 2016-05-31 DIAGNOSIS — I779 Disorder of arteries and arterioles, unspecified: Secondary | ICD-10-CM

## 2016-05-31 DIAGNOSIS — I10 Essential (primary) hypertension: Secondary | ICD-10-CM | POA: Diagnosis not present

## 2016-05-31 DIAGNOSIS — Z13820 Encounter for screening for osteoporosis: Secondary | ICD-10-CM

## 2016-05-31 DIAGNOSIS — Z1239 Encounter for other screening for malignant neoplasm of breast: Secondary | ICD-10-CM

## 2016-05-31 LAB — COMPREHENSIVE METABOLIC PANEL
ALT: 13 U/L (ref 6–29)
AST: 18 U/L (ref 10–35)
Albumin: 3.7 g/dL (ref 3.6–5.1)
Alkaline Phosphatase: 54 U/L (ref 33–130)
BUN: 23 mg/dL (ref 7–25)
CHLORIDE: 107 mmol/L (ref 98–110)
CO2: 23 mmol/L (ref 20–31)
CREATININE: 1.33 mg/dL — AB (ref 0.50–0.99)
Calcium: 9.1 mg/dL (ref 8.6–10.4)
Glucose, Bld: 97 mg/dL (ref 70–99)
POTASSIUM: 5.3 mmol/L (ref 3.5–5.3)
Sodium: 139 mmol/L (ref 135–146)
TOTAL PROTEIN: 6.9 g/dL (ref 6.1–8.1)
Total Bilirubin: 0.3 mg/dL (ref 0.2–1.2)

## 2016-05-31 LAB — CBC WITH DIFFERENTIAL/PLATELET
BASOS ABS: 0 {cells}/uL (ref 0–200)
Basophils Relative: 0 %
Eosinophils Absolute: 171 cells/uL (ref 15–500)
Eosinophils Relative: 3 %
HCT: 34.3 % — ABNORMAL LOW (ref 35.0–45.0)
Hemoglobin: 11.3 g/dL — ABNORMAL LOW (ref 12.0–15.0)
LYMPHS PCT: 21 %
Lymphs Abs: 1197 cells/uL (ref 850–3900)
MCH: 28.5 pg (ref 27.0–33.0)
MCHC: 32.9 g/dL (ref 32.0–36.0)
MCV: 86.6 fL (ref 80.0–100.0)
MONOS PCT: 10 %
MPV: 11.2 fL (ref 7.5–12.5)
Monocytes Absolute: 570 cells/uL (ref 200–950)
NEUTROS ABS: 3762 {cells}/uL (ref 1500–7800)
NEUTROS PCT: 66 %
PLATELETS: 242 10*3/uL (ref 140–400)
RBC: 3.96 MIL/uL (ref 3.80–5.10)
RDW: 13.8 % (ref 11.0–15.0)
WBC: 5.7 10*3/uL (ref 3.8–10.8)

## 2016-05-31 LAB — LIPID PANEL
CHOL/HDL RATIO: 4.9 ratio (ref <5.0)
CHOLESTEROL: 156 mg/dL (ref <200)
HDL: 32 mg/dL — ABNORMAL LOW (ref >50)
LDL CALC: 99 mg/dL (ref <100)
Triglycerides: 124 mg/dL (ref <150)
VLDL: 25 mg/dL (ref <30)

## 2016-05-31 NOTE — Progress Notes (Signed)
.   Subjective:   Patient presents for Medicare Annual/Subsequent preventive examination.   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 Feels well otherwise  No concerns    Review Past Medical/Family/Social: Per EMR    Risk Factors  Current exercise habits: walks Dietary issues discussed: Yes  Cardiac risk factors: Obesity (BMI >= 30 kg/m2). HTN , Hyperlipidemia   Depression Screen  (Note: if answer to either of the following is "Yes", a more complete depression screening is indicated)  Over the past two weeks, have you felt down, depressed or hopeless? No Over the past two weeks, have you felt little interest or pleasure in doing things? No Have you lost interest or pleasure in daily life? No Do you often feel hopeless? No Do you cry easily over simple problems? No   Activities of Daily Living  In your present state of health, do you have any difficulty performing the following activities?:  Driving? No  Managing money? No  Feeding yourself? No  Getting from bed to chair? No  Climbing a flight of stairs? No  Preparing food and eating?: No  Bathing or showering? No  Getting dressed: No  Getting to the toilet? No  Using the toilet:No  Moving around from place to place: No  In the past year have you fallen or had a near fall?:No  Are you sexually active? No  Do you have more than one partner? No   Hearing Difficulties: Yes  Do you often ask people to speak up or repeat themselves? Yes  Do you experience ringing or noises in your ears? No Do you have difficulty understanding soft or whispered voices? Yes Do you feel that you have a problem with memory? No Do you often misplace items? Yes   Do you feel safe at home? Yes  Cognitive Testing  Alert? Yes Normal Appearance?Yes  Oriented to person? Yes Place? Yes  Time? Yes  Recall of three objects? Yes  Can perform simple calculations? Yes  Displays appropriate judgment?Yes  Can read the correct time from a watch face?Yes   List the Names of Other Physician/Practitioners you currently use:   Dr. Aline Brochure- Orthopedics  Screening Tests / Date Colonoscopy       UTD               Zostavax Due Mammogram Due  Bone Density- Due  Pneumonia- UTD  Tetanus/tdap UTD  ROS: GEN- +fatigue, denies  fever, weight loss,weakness, recent illness HEENT- denies eye drainage, change in vision, nasal discharge, CVS- denies chest pain, palpitations RESP- denies SOB, cough, wheeze ABD- denies N/V, change in stools, abd pain GU- denies dysuria, hematuria, dribbling, incontinence MSK- denies joint pain, muscle aches, injury Neuro- denies headache, dizziness, syncope, seizure activity  Physical: GEN- NAD, alert and oriented x3 HEENT- PERRL, EOMI, non injected sclera, pink conjunctiva, MMM, oropharynx clear, tm mild wax left side, no impaction, hearing grossly in tact  Neck- Supple, no thryomegaly CVS- RRR, no murmur RESP-CTAB ABD-NABS,soft,NT,ND EXT- traece ankle edema Pulses- Radial, DP- 2+    Assessment:    Annual wellness medicare exam   Plan:    During the course of the visit the patient was educated and counseled about  appropriate screening and preventive services including:  Screening mammography  Bone Density  Shingles vaccine. Lovette Cliche GIVEN    Carotid artery disease- check lipids, due for repeat US    Discussed living will /handout given on advanced directives   Hypertension- well controlled, no changes to medication, check fasting labs today   Passed pure tone    Diet review for nutrition referral? Yes ____ Not Indicated __x__  Patient Instructions (the written plan) was given to the patient.  Medicare  Attestation  I have personally reviewed:  The patient's medical and social history  Their use of alcohol, tobacco or illicit drugs  Their current medications and supplements  The patient's functional ability including ADLs,fall risks, home safety risks, cognitive, and hearing and visual impairment  Diet and physical activities  Evidence for depression or mood disorders  The patient's weight, height, BMI, and visual acuity have been recorded in the chart. I have made referrals, counseling, and provided education to the patient based on review of the above and I have provided the patient with a written personalized care plan for preventive services.

## 2016-05-31 NOTE — Patient Instructions (Addendum)
Shingles vaccine given  Schedule your mammogram and Bone Density Carotid ultrasound be to scheduled   F/U 6 MONTHS

## 2016-06-08 ENCOUNTER — Encounter: Payer: Self-pay | Admitting: *Deleted

## 2016-07-17 ENCOUNTER — Ambulatory Visit (HOSPITAL_COMMUNITY)
Admission: RE | Admit: 2016-07-17 | Discharge: 2016-07-17 | Disposition: A | Discharge status: Home / Self Care | Payer: BC Managed Care – PPO | Source: Ambulatory Visit | Attending: Family Medicine | Admitting: Family Medicine

## 2016-07-17 DIAGNOSIS — Z1231 Encounter for screening mammogram for malignant neoplasm of breast: Secondary | ICD-10-CM | POA: Diagnosis not present

## 2016-07-17 DIAGNOSIS — I739 Peripheral vascular disease, unspecified: Principal | ICD-10-CM

## 2016-07-17 DIAGNOSIS — I779 Disorder of arteries and arterioles, unspecified: Secondary | ICD-10-CM

## 2016-07-17 DIAGNOSIS — Z780 Asymptomatic menopausal state: Secondary | ICD-10-CM

## 2016-07-17 DIAGNOSIS — I6523 Occlusion and stenosis of bilateral carotid arteries: Secondary | ICD-10-CM | POA: Diagnosis not present

## 2016-07-17 DIAGNOSIS — R928 Other abnormal and inconclusive findings on diagnostic imaging of breast: Secondary | ICD-10-CM | POA: Diagnosis not present

## 2016-07-17 DIAGNOSIS — E2839 Other primary ovarian failure: Secondary | ICD-10-CM | POA: Diagnosis not present

## 2016-07-19 ENCOUNTER — Encounter: Payer: Self-pay | Admitting: *Deleted

## 2016-10-18 ENCOUNTER — Other Ambulatory Visit: Payer: Medicare Other

## 2016-10-18 ENCOUNTER — Encounter: Payer: BC Managed Care – PPO | Admitting: Orthopedic Surgery

## 2016-12-18 ENCOUNTER — Encounter: Payer: Self-pay | Admitting: Orthopedic Surgery

## 2016-12-18 ENCOUNTER — Ambulatory Visit (INDEPENDENT_AMBULATORY_CARE_PROVIDER_SITE_OTHER): Payer: Medicare Other

## 2016-12-18 ENCOUNTER — Ambulatory Visit: Payer: Medicare Other | Admitting: Orthopedic Surgery

## 2016-12-18 VITALS — BP 166/76 | HR 70 | Ht 66.0 in | Wt 195.0 lb

## 2016-12-18 DIAGNOSIS — M47816 Spondylosis without myelopathy or radiculopathy, lumbar region: Secondary | ICD-10-CM | POA: Diagnosis not present

## 2016-12-18 DIAGNOSIS — M5441 Lumbago with sciatica, right side: Secondary | ICD-10-CM

## 2016-12-18 DIAGNOSIS — M545 Low back pain, unspecified: Secondary | ICD-10-CM

## 2016-12-18 DIAGNOSIS — M1711 Unilateral primary osteoarthritis, right knee: Secondary | ICD-10-CM | POA: Diagnosis not present

## 2016-12-18 DIAGNOSIS — M4316 Spondylolisthesis, lumbar region: Secondary | ICD-10-CM

## 2016-12-18 NOTE — Progress Notes (Signed)
Progress Note   Patient ID: Robin Villa, female   DOB: 20-Oct-1950, 66 y.o.   MRN: [id number]  Chief Complaint  Patient presents with  . Leg Pain    right buttock pain into right knee    66 year old female followed for osteoarthritis right knee in 2017 showing valgus osteoarthritis.  She was treated nonoperatively with injection and ibuprofen  She comes in today complaining of right buttock pain radiating into the right knee which is dull constant aching for several weeks duration  The right knee itself she says it is just stiff it is hard for her to get out of the chair     Review of Systems  Gastrointestinal: Negative.   Genitourinary: Negative.   Neurological: Negative for tingling and focal weakness.   Current Meds  Medication Sig  . aspirin 81 MG tablet Take 1 tablet (81 mg total) by mouth daily.  . Calcium Carb-Cholecalciferol (CALCIUM + D3) 600-200 MG-UNIT TABS Take by mouth 2 (two) times daily.  Marland Kitchen esomeprazole (NEXIUM) 40 MG capsule Take 1 capsule (40 mg total) by mouth daily.  Marland Kitchen ibuprofen (ADVIL,MOTRIN) 200 MG tablet Take 400 mg by mouth every 6 (six) hours as needed.  Marland Kitchen lisinopril-hydrochlorothiazide (PRINZIDE,ZESTORETIC) 20-25 MG tablet TAKE ONE TABLET BY MOUTH ONCE DAILY  . Multiple Vitamins-Minerals (MULTIVITAMIN WITH MINERALS) tablet Take 1 tablet by mouth daily.  . simvastatin (ZOCOR) 20 MG tablet Take 1 tablet (20 mg total) by mouth at bedtime.    Past Medical History:  Diagnosis Date  . Arthritis   . Heart murmur    asymptomatic  . Hypercholesteremia   . Hypertension      No Known Allergies  BP (!) 166/76   Pulse 70   Ht 5\' 6"  (1.676 m)   Wt 195 lb (88.5 kg)   BMI 31.47 kg/m    Physical Exam  Constitutional: She is oriented to person, place, and time. She appears well-developed and well-nourished.  Neurological: She is alert and oriented to person, place, and time.  Psychiatric: She has a normal mood and affect. Judgment normal.  Vitals  reviewed.   Ortho Exam  Gait no assistive devices no significant limp  Right knee is in valgus, range of motion is 120 degrees, pseudolaxity with valgus stress, no motor deficit skin is intact no laceration or erythema sensory exam is normal in the right leg no peripheral edema  Lumbar spine nontender right hip,/buttock nontender straight leg raise negative   Medical decision-making  Imaging: Prior x-ray from October 19, 2015 right knee valgus alignment joint space narrowing severe osteophyte cyst formation lateral compartment  X-rays today ordered lumbar spine  3 views lumbar spine lumbar spondylosis with spondylolisthesis L4 on L5  Please see my dictated report   Plan    Encounter Diagnoses  Name Primary?  . Low back pain radiating to right lower extremity Yes  . Arthritis of right knee   . Spondylolisthesis at L4-L5 level   . Lumbar spondylosis    Recommend physical therapy lumbar spine twice a week 4 weeks follow-up 6 weeks  Patient will also have a injection right knee for osteoarthritis  Procedure note right knee injection verbal consent was obtained to inject right knee joint  Timeout was completed to confirm the site of injection  The medications used were 40 mg of Depo-Medrol and 1% lidocaine 3 cc  Anesthesia was provided by ethyl chloride and the skin was prepped with alcohol.  After cleaning the skin with alcohol a 20-gauge needle  was used to inject the right knee joint. There were no complications. A sterile bandage was applied.       Arther Abbott, MD 12/18/2016 10:40 AM

## 2016-12-18 NOTE — Patient Instructions (Addendum)
Spondylolisthesis Spondylolisthesis is when one of the bones in the spine (vertebrae) slips forward and out of place. This most commonly occurs in the lower back (lumbar spine), but it can happen anywhere along the spine. A vertebra may move out of place due to a previous back injury. In some cases, the injury may go unnoticed until the vertebra moves out of place later in life. Spondylolisthesis may also be caused by an injury or a condition that affects the bones. What are the causes? This condition may be caused by:  Injury (trauma). This is often a result of doing sports or physical activities that: ? Put a lot of strain on the bones in the lower back. ? Involve repetitive overstretching (hyperextension) of the spine.  A condition that affects the bones, such as osteoarthritis or cancer.  Changes in the spine that happen due to age-related wear and tear.  What increases the risk? The following factors may make you more likely to develop this condition:  Participating in sports or activities that put a lot of strain on the lower back, including: ? Gymnastics. ? Figure skating. ? Weight lifting. ? Football.  Having a condition that affects the bones.  Being older than age 39.  What are the signs or symptoms? Symptoms of this condition may include:  Mild to severe pain in the legs, lower back, or buttocks.  An abnormal way of walking (abnormal gait).  Poor posture.  Muscle stiffness, specifically in the hamstrings. The hamstrings are in the backs of the thighs.  Weakness, numbness, or a tingling sensation in the legs.  Symptoms may get worse when standing, and they may temporarily get better when sitting down or bending forward. In some cases, there may be no symptoms of this condition. How is this diagnosed?  This condition may be diagnosed based on:  Your symptoms.  Your medical history.  A physical exam. ? Your health care provider may push on certain areas to  determine the source of your pain. ? You may be asked to bend forward, backward, and side to side so your health care provider can assess the severity of your pain and your range of motion.  Imaging tests, such as: ? X-rays. ? CT scan. ? MRI.  How is this treated? Treatment for this condition may include:  Resting. This may involve avoiding or modifying activities that put strain on your back until your symptoms improve.  Medicines to help relieve pain.  NSAIDs to help reduce swelling and discomfort.  Injections of medicine (cortisone) in your back. These injections can to help relieve pain and numbness.  A brace to stabilize and support your back.  Physical therapy. This may include working with an occupational therapist or physical therapist who can teach you how to reduce pressure on your back while you do everyday activities.  Surgery. This may be needed if: ? Other treatment methods do not improve your condition. ? Your symptoms do not go away after 3-6 months. ? You are unable to walk or stand. ? You have severe pain.  Follow these instructions at home: If you have a brace:  Wear it as told by your health care provider. Remove it only as told by your health care provider.  Do not let your brace get wet if it is not waterproof.  Keep the brace clean. Driving  Do not drive or operate heavy machinery until you know how your pain medicine affects you.  Ask your health care provider when it  is safe to drive if you have a back brace. Activity  Rest and return to your normal activities as told by your health care provider. Ask your health care provider what activities are safe for you.  Avoid activities that take a lot of effort (are strenuous) for as long as told by your health care provider.  Do exercises as told by your health care provider. General instructions  Take over-the-counter and prescription medicines only as told by your health care provider.  If you  have questions or concerns about safety while taking pain medicine, talk with your health care provider.  Do not use any tobacco products, such as cigarettes, chewing tobacco, and e-cigarettes. Tobacco can delay bone healing. If you need help quitting, ask your health care provider.  Keep all follow-up visits as told by your health care provider. This is important. How is this prevented?  Warm up and stretch before being active.  Cool down and stretch after being active.  Give your body time to rest between periods of activity.  Make sure to use equipment that fits you.  Be safe and responsible while being active to avoid falls.  Do at least 150 minutes of moderate-intensity exercise each week, such as brisk walking or water aerobics.  Maintain physical fitness, including: ? Strength. ? Flexibility. ? Cardiovascular fitness. ? Endurance. Contact a health care provider if:  You have pain that gets worse or does not get better. Get help right away if:  You have severe back pain.  You develop weakness or numbness in your legs.  You are unable to stand or walk. This information is not intended to replace advice given to you by your health care provider. Make sure you discuss any questions you have with your health care provider. Document Released: 01/16/2005 Document Revised: 09/23/2015 Document Reviewed: 10/27/2014 Elsevier Interactive Patient Education  [employer].

## 2016-12-27 ENCOUNTER — Encounter (HOSPITAL_COMMUNITY): Payer: Self-pay

## 2016-12-27 ENCOUNTER — Ambulatory Visit (HOSPITAL_COMMUNITY): Payer: Medicare Other | Attending: Orthopedic Surgery

## 2016-12-27 ENCOUNTER — Other Ambulatory Visit: Payer: Self-pay

## 2016-12-27 DIAGNOSIS — R29898 Other symptoms and signs involving the musculoskeletal system: Secondary | ICD-10-CM | POA: Diagnosis present

## 2016-12-27 DIAGNOSIS — M6281 Muscle weakness (generalized): Secondary | ICD-10-CM | POA: Insufficient documentation

## 2016-12-27 DIAGNOSIS — R293 Abnormal posture: Secondary | ICD-10-CM

## 2016-12-27 DIAGNOSIS — R269 Unspecified abnormalities of gait and mobility: Secondary | ICD-10-CM | POA: Diagnosis present

## 2016-12-27 NOTE — Patient Instructions (Signed)
   Seated figure four: 1-2 times per day 2x 30 second holds  Sit on the edge of a chair/bench/bed. Place one ankle over the opposite knee. For greater stretch in the buttock area gently push the knee of the crossed leg down away from you.

## 2016-12-27 NOTE — Therapy (Signed)
[place], [place], [address] Phone: [phone]   Fax:  [phone]  Physical Therapy Evaluation  Patient Details  Name: Robin Villa MRN: [id number] Date of Birth: 1950-03-12 Referring Provider: Carole Civil   Encounter Date: 12/27/2016  PT End of Session - 12/27/16 1342    Visit Number  1    Number of Visits  9    Date for PT Re-Evaluation  01/24/17    Authorization Type  [employer] Medicare    Authorization Time Period  12/27/16 - 02/21/17    Authorization - Visit Number  1    Authorization - Number of Visits  9    PT Start Time  7616    PT Stop Time  0737    PT Time Calculation (min)  46 min    Activity Tolerance  Patient tolerated treatment well;No increased pain    Behavior During Therapy  WFL for tasks assessed/performed       Past Medical History:  Diagnosis Date  . Arthritis   . Heart murmur    asymptomatic  . Hypercholesteremia   . Hypertension     Past Surgical History:  Procedure Laterality Date  . CARPAL TUNNEL RELEASE     bilateral   . CATARACT EXTRACTION W/PHACO Right 09/07/2014   Procedure: CATARACT EXTRACTION PHACO AND INTRAOCULAR LENS PLACEMENT RIGHT EYE CDE=6.10;  Surgeon: Tonny Branch, MD;  Location: [facility];  Service: Ophthalmology;  Laterality: Right;  . COLONOSCOPY  09/15/2011   Procedure: COLONOSCOPY;  Surgeon: Danie Binder, MD;  Location: [facility];  Service: Endoscopy;  Laterality: N/A;  11:00AM  . TUBAL LIGATION      There were no vitals filed for this visit.   Subjective Assessment - 12/27/16 1350    Subjective  Patient reports her pain occurs when moving around. She states it is in her low back and run down her right leg, and describes it as an uncomfortable dull ache. She reports occasionally it feels like a "zinging" down her right leg all the way to her foot. She is limited currently by her pain with how long she can perform activities and that it began a couple of  months ago. She does not recall any injury or traumatic event that occurred at the time of onset. One of the times it hurts the worst is when she gets up from laying down. She has some pain at night but it does not always wake her up, only if she is laying on her right side.    Limitations  Walking;House hold activities    How long can you sit comfortably?  however long I want    How long can you stand comfortably?  not limited    How long can you walk comfortably?  walk about 30 minutes before needing a break    Diagnostic tests  X ray with Dr. Aline Brochure, can't explain it like he did, "like a slipped something"    Patient Stated Goals  be able to do things for a longer time without pain    Currently in Pain?  No/denies         [facility] PT Assessment - 12/27/16 0001      Assessment   Medical Diagnosis  Low Back Pain Radiating down Right Leg    Referring Provider  Carole Civil    Onset Date/Surgical Date  10/27/16 approxiamte    Next MD Visit  02/07/17 approximate  Prior Therapy  no      Precautions   Precautions  None      Restrictions   Weight Bearing Restrictions  No      Balance Screen   Has the patient fallen in the past 6 months  No    Has the patient had a decrease in activity level because of a fear of falling?   No just rests a little if in pain    Is the patient reluctant to leave their home because of a fear of falling?   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    Leisure  go church, mows her  yard, Radio broadcast assistant, gardeningi      Cognition   Overall Cognitive Status  Within Functional Limits for tasks assessed      Observation/Other Assessments   Observations  excessive BLE knee valgus    Focus on Therapeutic Outcomes (FOTO)   33% intake      Functional Tests   Functional tests  Squat;Single leg stance      Squat   Comments  excessive BLE knee valgus, early heel rise, forward trunk lean,       Single Leg Stance   Comments  30 seconds BLE with eyes open, unable to perform eyes closed      Posture/Postural Control   Posture/Postural Control  Postural limitations    Postural Limitations  Rounded Shoulders;Forward head;Increased lumbar lordosis;Anterior pelvic tilt;Flexed trunk;Weight shift left      AROM   Overall AROM Comments  Bilateral UE and LE WFL's except for hip extension    Right Hip Extension  -- cannot acheive neutral    Left Hip Extension  -- cannot acheive neutral    Lumbar Flexion  25% limited    Lumbar Extension  excessive (hinge at TL junction)    Lumbar - Right Side Bend  50%    Lumbar - Left Side Bend  50%    Lumbar - Right Rotation  25%    Lumbar - Left Rotation  25%    Thoracic Flexion  25%    Thoracic Extension  50%    Thoracic - Right Side Bend  25%    Thoracic - Left Side Bend  25%    Thoracic - Right Rotation  25%    Thoracic - Left Rotation  25%      Strength   Right Hip Flexion  4/5    Right Hip Extension  4-/5    Right Hip ABduction  4+/5    Left Hip Flexion  4+/5    Left Hip Extension  4-/5    Left Hip ABduction  4+/5    Right Knee Flexion  4+/5 painful    Right Knee Extension  5/5    Left Knee Flexion  5/5    Left Knee Extension  5/5  Right Ankle Dorsiflexion  5/5    Left Ankle Dorsiflexion  5/5    Lumbar Flexion  5/5    Lumbar Extension  5/5    Thoracic Flexion  5/5    Thoracic Extension  5/5      Flexibility   Soft Tissue Assessment /Muscle Length  yes    Quadriceps  50% limited    ITB  50% limited hyperactive TFL/iliopsoas     Piriformis  25% limited      Special Tests    Special Tests  Lumbar;Sacrolliac Tests;Hip Special Tests    Lumbar Tests  Slump Test;Straight Leg Raise;FABER test    Sacroiliac Tests   Sacral Compression    Hip Special Tests   Marcello Moores Test;Ober's Test      FABER test   findings  Negative    Side  Right      Slump test   Findings  Negative    Side  Right      Straight Leg Raise   Findings  Negative    Side   Right      Pelvic Dictraction   Findings  Negative    Side   Right    Comment  and left      Pelvic Compression   Findings  Negative    Side  Right    comment  and left      Sacral thrust    Findings  Negative    Side  Right    Comments  and left      Gaenslen's test   Findings  Positive    Side   Right    Comments  patient felt her pain along right lateral thigh      Sacral Compression   Findings  Negative    Side   Right    Comments  and left      Thomas Test    Findings  Positive    Side  Right    Comments  patient RLE with limited knee flexion, thigh above tabel and ER indicating iliopsoas, TFL, and quadriceps tightness      Ober's Test   Findings  Positive    Side  Right and left    Comments  patient unable to bring RLE into extension to achieve test position indicating muscle length deficits of iliopsoas      Ambulation/Gait   Gait Pattern  Step-through pattern;Decreased step length - left;Decreased stance time - right;Decreased stride length;Decreased hip/knee flexion - right;Decreased hip/knee flexion - left;Decreased dorsiflexion - right;Decreased weight shift to right;Right foot flat;Antalgic;Lateral trunk lean to left;Trunk flexed;Wide base of support toe out on RLE    Ambulation Surface  Level    Gait velocity  0.91 m/s    Gait Comments  patietn with no increased pain walking      6 minute walk test results    Aerobic Endurance Distance Walked  532    Endurance additional comments  3  MWT      Objective measurements completed on  examination: See above findings.    [place] Adult PT Treatment/Exercise - 12/27/16 0001      Lumbar Exercises: Stretches   Piriformis Stretch  2 reps;30 seconds    Piriformis Stretch Limitations  seated        PT Education - 12/27/16 1609    Education provided  Yes    Education Details  Educated on objective findings and discussed POC frequency that would work for patient. Educated on and initiated  HEP wiht piriformis stretch.    Person(s) Educated  Patient    Methods  Explanation;Demonstration;Handout    Comprehension  Verbalized understanding;Returned demonstration       PT Short Term Goals - 12/27/16 1610      PT SHORT TERM GOAL #1   Title  Patient will be independent with HEP and be able to demonstrate proper form/technique with exercises to progress towards LTG's.    Time  3    Period  Weeks    Status  New    Target Date  01/17/17      PT SHORT TERM GOAL #2   Title  Patient will have improved MMT by 1/2 grade for all muscle groups tested to progress functinoal strength for increaed activity endurance.    Time  4    Period  Weeks    Status  New    Target Date  01/24/17      PT SHORT TERM GOAL #3   Title  Patient will ambulate at =/>1.2 m/s gait velocity and demonstrate equal step length and weight shift during 3 MWT to demonstrate community ambulator walking speed and improved quality.    Time  4    Period  Weeks    Status  New        PT Long Term Goals - 12/27/16 1615      PT LONG TERM GOAL #1   Title  Patient will have improved MMT by 1 grade for all muscle groups tested to progress functional strength for increaed activity endurance.    Time  8    Period  Weeks    Status  New    Target Date  02/21/17      PT LONG TERM GOAL #2   Title  Patient will have improved muscle length with ability to achieve test position for Obers test, and have a Thomas test with thigh resting on table and knee resting at 90*.    Time  8    Period  Weeks    Status  New      PT  LONG TERM GOAL #3   Title  Patient will have improved thoracic mobility to WNL's decrease stress at thoracolumbar junction and decrease pain with functional mobility to increase activity tolerance.    Time  8    Period  Weeks    Status  New        Plan - 12/27/16 1554    Clinical Impression Statement  Ms. Zaucha presents to therapy for initial evaluation for low back pain radiating into right lower extremity. She presents with poor posture, muscle length restrcitions, joint hyper and hypo mobilities, and muscle weakness. She lives with supportive adult son and is independent with functinoal activities such as cleaning, grocery shopping, and driving. She currently is limited in activity tolerance and requires rest breaks secondary to her pain. She will benefit from skilled PT to address curreny impairments and work towards goals to improve functional mobility and QOL.    Clinical Presentation  Stable    Clinical Presentation due to:  Bilateral knee OA, R knee injection, MRI findings of L4-5 Spondylolithesis    Clinical Decision Making  Low    Rehab Potential  Good    Clinical Impairments Affecting Rehab Potential  (+) motivated to participate, (+) active in community and with household activities, (-) financial resources limiting therapy frequency    PT Frequency  1x / week    PT Duration  8 weeks  PT Treatment/Interventions  ADLs/Self Care Home Management;Moist Heat;Therapeutic activities;Therapeutic exercise;Gait training;Functional mobility training;Stair training;Patient/family education;Neuromuscular re-education;Balance training;Manual techniques;Passive range of motion;Energy conservation;Cryotherapy;Electrical Stimulation;Taping    PT Next Visit Plan  Review Eval and goals. Teach log roll technique. Initiate manual therapy for myofascial restrictions and and stretch for quad, TFL, iliopsoas flexibilty. Hip strengthening with focus on glute max/med. Patient is hyper mobile and T/L juntion  and hypomobile at surrounding joints, will benefit from mobilization at T spine.    PT Home Exercise Plan  Eval: piriformis stretch;     Consulted and Agree with Plan of Care  Patient       Patient will benefit from skilled therapeutic intervention in order to improve the following deficits and impairments:  Abnormal gait, Decreased endurance, Hypomobility, Decreased activity tolerance, Decreased strength, Increased fascial restricitons, Pain, Decreased balance, Decreased mobility, Difficulty walking, Increased muscle spasms, Decreased range of motion, Improper body mechanics, Impaired flexibility, Postural dysfunction, Hypermobility  Visit Diagnosis: Abnormality of gait and mobility  Abnormal posture  Muscle weakness (generalized)  Other symptoms and signs involving the musculoskeletal system  G-Codes - 03-Jan-2017 1621    Functional Assessment Tool Used (Outpatient Only)  FOTO and Clinical Judgement    Functional Limitation  Mobility: Walking and moving around    Mobility: Walking and Moving Around Current Status [phone])  At least 20 percent but less than 40 percent impaired, limited or restricted    Mobility: Walking and Moving Around Goal Status [phone])  At least 1 percent but less than 20 percent impaired, limited or restricted        Problem List Patient Active Problem List   Diagnosis Date Noted  . Carotid artery disease ([place]) 05/31/2016  . CKD (chronic kidney disease), stage II 08/18/2015  . GERD (gastroesophageal reflux disease) 05/12/2015  . OA (osteoarthritis) of knee 07/25/2013  . Hyperlipidemia 12/04/2011  . Hemorrhoids 08/23/2011  . Essential hypertension, benign 03/20/2011  . Obesity 03/20/2011  . DUB (dysfunctional uterine bleeding) 03/20/2011    Debara Pickett, PT, DPT Physical Therapist with [facility]  01/03/17 4:28 PM    [place] [address] [place], [place], [address] Phone:  [phone]   Fax:  [phone]  Name: Robin Villa MRN: [id number] Date of Birth: [birth year]

## 2017-01-03 ENCOUNTER — Ambulatory Visit (HOSPITAL_COMMUNITY): Payer: Medicare Other | Attending: Orthopedic Surgery

## 2017-01-03 DIAGNOSIS — R293 Abnormal posture: Secondary | ICD-10-CM | POA: Diagnosis not present

## 2017-01-03 DIAGNOSIS — R269 Unspecified abnormalities of gait and mobility: Secondary | ICD-10-CM | POA: Diagnosis present

## 2017-01-03 DIAGNOSIS — R29898 Other symptoms and signs involving the musculoskeletal system: Secondary | ICD-10-CM | POA: Insufficient documentation

## 2017-01-03 DIAGNOSIS — M6281 Muscle weakness (generalized): Secondary | ICD-10-CM

## 2017-01-03 NOTE — Therapy (Addendum)
[place], [place], [address] Phone: [phone]   Fax:  [phone]  Physical Therapy Treatment/Discharge Summary  Patient Details  Name: Robin Villa MRN: [id number] Date of Birth: Nov 12, 1950 Referring Provider: Carole Civil   Encounter Date: 01/03/2017  PHYSICAL THERAPY DISCHARGE SUMMARY  Visits from Start of Care: 2  Current functional level related to goals / functional outcomes: See below details for last session. Patient called and self reported she has improved and feels better.   Patient began PT treatment today, had a positive attitude, and was motivated to participate. She progressed with functional strengthening and therapeutic stretching to reduce muscle length limitations. She continues to present with poor posture, muscle length restrictions, joint hyper and hypo motilities, and muscle weakness. Patient will benefit greatly from manual therapy to reduce hyperactive muscles and exercise to activate weak hip muscles. She will benefit from ongoing skilled PT to address current impairments and work towards goals to improve functional mobility and QOL.    Remaining deficits: See below details.   Education / Equipment: See below from last session.  Plan: Patient agrees to discharge.  Patient goals were not met. Patient is being discharged due to the patient's request.  ?????         PT End of Session - 01/03/17 1614    Visit Number  2    Number of Visits  9    Date for PT Re-Evaluation  01/24/17    Authorization Type  [employer] Medicare    Authorization Time Period  12/27/16 - 02/21/17    Authorization - Visit Number  2    Authorization - Number of Visits  9    PT Start Time  6962    PT Stop Time  1602    PT Time Calculation (min)  43 min    Activity Tolerance  Patient tolerated treatment well;No increased pain    Behavior During Therapy  WFL for tasks assessed/performed       Past Medical  History:  Diagnosis Date  . Arthritis   . Heart murmur    asymptomatic  . Hypercholesteremia   . Hypertension     Past Surgical History:  Procedure Laterality Date  . CARPAL TUNNEL RELEASE     bilateral   . CATARACT EXTRACTION W/PHACO Right 09/07/2014   Procedure: CATARACT EXTRACTION PHACO AND INTRAOCULAR LENS PLACEMENT RIGHT EYE CDE=6.10;  Surgeon: Tonny Branch, MD;  Location: [facility];  Service: Ophthalmology;  Laterality: Right;  . COLONOSCOPY  09/15/2011   Procedure: COLONOSCOPY;  Surgeon: Danie Binder, MD;  [facility];  Service: Endoscopy;  Laterality: N/A;  11:00AM  . TUBAL LIGATION      There were no vitals filed for this visit.  Subjective Assessment - 01/03/17 1523    Subjective  Patient is feeling well overall. She reports a new pain in her right upper arm and is not sure if it related to her back. When asked she says they occur with and without each other. She reports she has been performing her HEP about 1 time every day and it is going well    Currently in Pain?  Yes    Pain Score  4     Pain Location  Arm    Pain Orientation  Right    Pain Descriptors / Indicators  Aching    Pain Type  Acute pain patient unsure of relation to back    Pain Onset  In  the past 7 days    Pain Frequency  Intermittent    Aggravating Factors   unsure    Pain Relieving Factors  unsure    Effect of Pain on Daily Activities  unsure    Multiple Pain Sites  No denies low back pain today      [facility] Adult PT Treatment/Exercise - 01/03/17 0001      Transfers   Transfers  Sit to Supine;Supine to [employer]  verbal cues to sequence log roll technique to decreae stress on low back and conserve energy, 3x      Lumbar Exercises: Stretches   Sports administrator  2 reps;30 seconds;Limitations    Quad Stretch Limitations  standing, back leg on 12" step, patient feels stretch with backward trunk lean quad and ilipsoas stretch    Piriformis Stretch  2 reps;30 seconds    Piriformis  Stretch Limitations  seated      Lumbar Exercises: Standing   Functional Squats  10 reps;Limitations    Functional Squats Limitations  minisquat on wall, tactiel cues at lateral knee to deter valgus    Other Standing Lumbar Exercises  4 way hip with red theraband, intermittent/1 hand  UE support    Other Standing Lumbar Exercises  forward/lateral step up with 4" box      Lumbar Exercises: Prone   Other Prone Lumbar Exercises  10 time prone press up for 10 second holds follwo manual therapy      Manual Therapy   Manual Therapy  Joint mobilization;Myofascial release    Manual therapy comments  completed seperate of other services    Joint Mobilization  Grade III/IV PA T1-9, 2x 30 seconds    Myofascial Release  right iliopsoas trigger point release       PT Education - 01/03/17 1614    Education provided  Yes    Education Details  Reviewd goals and initial evaluaiton with patient, educated on proper stretchign technique and time for HEP. Ecuated on proper form throughout session with exercises.    Person(s) Educated  Patient    Methods  Explanation;Demonstration    Comprehension  Verbalized understanding;Tactile cues required;Verbal cues required       PT Short Term Goals - 12/27/16 1610      PT SHORT TERM GOAL #1   Title  Patient will be independent with HEP and be able to demonstrate proper form/technique with exercises to progress towards LTG's.    Time  3    Period  Weeks    Status  New    Target Date  01/17/17      PT SHORT TERM GOAL #2   Title  Patient will have improved MMT by 1/2 grade for all muscle groups tested to progress functinoal strength for increaed activity endurance.    Time  4    Period  Weeks    Status  New    Target Date  01/24/17      PT SHORT TERM GOAL #3   Title  Patient will ambulate at =/>1.2 m/s gait velocity and demonstrate equal step length and weight shift during 3 MWT to demonstrate community ambulator walking speed and improved quality.     Time  4    Period  Weeks    Status  New        PT Long Term Goals - 12/27/16 1615      PT LONG TERM GOAL #1   Title  Patient will have improved MMT  by 1 grade for all muscle groups tested to progress functional strength for increaed activity endurance.    Time  8    Period  Weeks    Status  New    Target Date  02/21/17      PT LONG TERM GOAL #2   Title  Patient will have improved muscle length with ability to achieve test position for Obers test, and have a Thomas test with thigh resting on table and knee resting at 90*.    Time  8    Period  Weeks    Status  New      PT LONG TERM GOAL #3   Title  Patient will have improved thoracic mobility to WNL's decrease stress at thoracolumbar junction and decrease pain with functional mobility to increase activity tolerance.    Time  8    Period  Weeks    Status  New        Plan - 01/03/17 1615    Clinical Impression Statement  Patient began PT treatment today, had a positive attitude, and was motivated to participate. She progressed with functional strengthening and therapeutic stretching to reduce muscle length limitations. She continues to present with poor posture, muscle length restrictions, joint hyper and hypo motilities, and muscle weakness. Patient will benefit greatly from manual therapy to reduce hyperactive muscles and exercise to activate weak hip muscles. She will benefit from ongoing skilled PT to address current impairments and work towards goals to improve functional mobility and QOL.    Rehab Potential  Good    Clinical Impairments Affecting Rehab Potential  (+) motivated to participate, (+) active in community and with household activities, (-) financial resources limiting therapy frequency    PT Frequency  1x / week    PT Duration  8 weeks    PT Treatment/Interventions  ADLs/Self Care Home Management;Moist Heat;Therapeutic activities;Therapeutic exercise;Gait training;Functional mobility training;Stair  training;Patient/family education;Neuromuscular re-education;Balance training;Manual techniques;Passive range of motion;Energy conservation;Cryotherapy;Electrical Stimulation;Taping    PT Next Visit Plan  Review log roll technique and ask if it is helping. Continue with manual therapy for myofascial restrictions and and stretch for quad, TFL, iliopsoas flexibilty. Hip strengthening with focus on glute max/med. Patient is hyper mobile and T/L juntion and hypomobile at surrounding joints, will benefit from mobilization at T spine. Continue with functional strengthening for hip extension and abduction. Update HEP.    PT Home Exercise Plan  Eval: piriformis stretch;     Consulted and Agree with Plan of Care  Patient       Patient will benefit from skilled therapeutic intervention in order to improve the following deficits and impairments:  Abnormal gait, Decreased endurance, Hypomobility, Decreased activity tolerance, Decreased strength, Increased fascial restricitons, Pain, Decreased balance, Decreased mobility, Difficulty walking, Increased muscle spasms, Decreased range of motion, Improper body mechanics, Impaired flexibility, Postural dysfunction, Hypermobility  Visit Diagnosis: Abnormal posture  Abnormality of gait and mobility  Muscle weakness (generalized)  Other symptoms and signs involving the musculoskeletal system     Problem List Patient Active Problem List   Diagnosis Date Noted  . Carotid artery disease ([place]) 05/31/2016  . CKD (chronic kidney disease), stage II 08/18/2015  . GERD (gastroesophageal reflux disease) 05/12/2015  . OA (osteoarthritis) of knee 07/25/2013  . Hyperlipidemia 12/04/2011  . Hemorrhoids 08/23/2011  . Essential hypertension, benign 03/20/2011  . Obesity 03/20/2011  . DUB (dysfunctional uterine bleeding) 03/20/2011    Debara Pickett, PT, DPT Physical Therapist with [facility]  01/03/2017 4:22 PM    [place], [place], [address] Phone: [phone]   Fax:  [phone]  Name: CHARITA LINDENBERGER MRN: [id number] Date of Birth: August 19, 1950

## 2017-01-10 ENCOUNTER — Encounter (HOSPITAL_COMMUNITY): Payer: Medicare Other

## 2017-01-17 ENCOUNTER — Ambulatory Visit (HOSPITAL_COMMUNITY): Payer: Medicare Other

## 2017-01-17 ENCOUNTER — Telehealth (HOSPITAL_COMMUNITY): Payer: Self-pay

## 2017-01-17 NOTE — Telephone Encounter (Signed)
Called patient regarding no show (#1)  this date with no previous no Shows at this time. Patient encouraged to call back to reschedule and was given clinic front office number.   Starr Lake PT, DPT 3:35 PM, 01/17/17 [phone]

## 2017-01-24 ENCOUNTER — Ambulatory Visit (HOSPITAL_COMMUNITY): Payer: Medicare Other | Admitting: Physical Therapy

## 2017-01-24 ENCOUNTER — Telehealth (HOSPITAL_COMMUNITY): Payer: Self-pay | Admitting: Physical Therapy

## 2017-01-24 ENCOUNTER — Telehealth (HOSPITAL_COMMUNITY): Payer: Self-pay

## 2017-01-24 NOTE — Telephone Encounter (Signed)
She requested to be D/c, she is better and will not be back

## 2017-01-31 ENCOUNTER — Other Ambulatory Visit: Payer: Self-pay

## 2017-01-31 ENCOUNTER — Encounter: Payer: Self-pay | Admitting: Family Medicine

## 2017-01-31 ENCOUNTER — Encounter (HOSPITAL_COMMUNITY): Payer: Medicare Other

## 2017-01-31 ENCOUNTER — Ambulatory Visit (INDEPENDENT_AMBULATORY_CARE_PROVIDER_SITE_OTHER): Payer: Medicare Other | Admitting: Family Medicine

## 2017-01-31 VITALS — BP 142/70 | HR 78 | Temp 97.9°F | Resp 14 | Ht 66.0 in | Wt 193.0 lb

## 2017-01-31 DIAGNOSIS — Z23 Encounter for immunization: Secondary | ICD-10-CM

## 2017-01-31 DIAGNOSIS — I10 Essential (primary) hypertension: Secondary | ICD-10-CM

## 2017-01-31 DIAGNOSIS — Z6831 Body mass index (BMI) 31.0-31.9, adult: Secondary | ICD-10-CM | POA: Diagnosis not present

## 2017-01-31 DIAGNOSIS — N182 Chronic kidney disease, stage 2 (mild): Secondary | ICD-10-CM

## 2017-01-31 DIAGNOSIS — E6609 Other obesity due to excess calories: Secondary | ICD-10-CM | POA: Diagnosis not present

## 2017-01-31 DIAGNOSIS — E7800 Pure hypercholesterolemia, unspecified: Secondary | ICD-10-CM

## 2017-01-31 NOTE — Assessment & Plan Note (Signed)
Lipids at goal on zocor

## 2017-01-31 NOTE — Addendum Note (Signed)
Addended by: Sheral Flow on: 01/31/2017 12:01 PM   Modules accepted: Orders

## 2017-01-31 NOTE — Progress Notes (Signed)
   Subjective:    Patient ID: Robin Villa, female    DOB: [birth year], 67 y.o.   MRN: [id number]  Patient presents for Follow-up and Hypertension Patient here to follow-up chronic medical problems.  She has no particular concerns today.  She is fasting today for her blood work.  She did not take her blood pressure medication but it has been fairly well controlled.  Was seen by orthopedics had physical therapy for a few weeks for her back states that things are improved as well as her hip so she discontinued going.  She also had an injection placed into her knee for arthritis.  She is planning to start back her water aerobics.  He is due today for flu shot as well as her second shingles. His labs reviewed her lipid panel was normal 8 months ago.    Review Of Systems:  GEN- denies fatigue, fever, weight loss,weakness, recent illness HEENT- denies eye drainage, change in vision, nasal discharge, CVS- denies chest pain, palpitations RESP- denies SOB, cough, wheeze ABD- denies N/V, change in stools, abd pain GU- denies dysuria, hematuria, dribbling, incontinence MSK- denies joint pain, muscle aches, injury Neuro- denies headache, dizziness, syncope, seizure activity       Objective:    BP (!) 142/70   Pulse 78   Temp 97.9 F (36.6 C) (Oral)   Resp 14   Ht 5\' 6"  (1.676 m)   Wt 193 lb (87.5 kg)   SpO2 98%   BMI 31.15 kg/m  GEN- NAD, alert and oriented x3 HEENT- PERRL, EOMI, non injected sclera, pink conjunctiva, MMM, oropharynx clear CVS- RRR, no murmur RESP-CTAB EXT- trace ankle edema Pulses- Radial, DP- 2+        Assessment & Plan:      Problem List Items Addressed This Visit      Unprioritized   Obesity - Primary    Short term goal set, goal by visit in 6 months, down at least 5lbs below 190lbs Start her water aerobics Cut out soda      Hyperlipidemia    Lipids at goal on zocor      Essential hypertension, benign    Controlled no changes to meds      CKD  (chronic kidney disease), stage II    Recheck renal function baseline around 1.3 Discussed this with patient Increasing water, exercise, healthy eating and keeping BP controlled to provent worsening of renal function         Note: This dictation was prepared with Dragon dictation along with smaller phrase technology. Any transcriptional errors that result from this process are unintentional.

## 2017-01-31 NOTE — Patient Instructions (Signed)
F/U 6 months for PHYSICAL  

## 2017-01-31 NOTE — Assessment & Plan Note (Signed)
Controlled no changes to meds 

## 2017-01-31 NOTE — Assessment & Plan Note (Signed)
Recheck renal function baseline around 1.3 Discussed this with patient Increasing water, exercise, healthy eating and keeping BP controlled to provent worsening of renal function

## 2017-01-31 NOTE — Assessment & Plan Note (Addendum)
Short term goal set, goal by visit in 6 months, down at least 5lbs below 190lbs Start her water aerobics Cut out soda

## 2017-02-01 ENCOUNTER — Encounter: Payer: Self-pay | Admitting: *Deleted

## 2017-02-01 LAB — COMPREHENSIVE METABOLIC PANEL
AG Ratio: 1.3 (calc) (ref 1.0–2.5)
ALBUMIN MSPROF: 4 g/dL (ref 3.6–5.1)
ALKALINE PHOSPHATASE (APISO): 63 U/L (ref 33–130)
ALT: 15 U/L (ref 6–29)
AST: 18 U/L (ref 10–35)
BUN/Creatinine Ratio: 20 (calc) (ref 6–22)
BUN: 28 mg/dL — AB (ref 7–25)
CO2: 20 mmol/L (ref 20–32)
CREATININE: 1.39 mg/dL — AB (ref 0.50–0.99)
Calcium: 9.5 mg/dL (ref 8.6–10.4)
Chloride: 108 mmol/L (ref 98–110)
Globulin: 3.2 g/dL (calc) (ref 1.9–3.7)
Glucose, Bld: 103 mg/dL — ABNORMAL HIGH (ref 65–99)
POTASSIUM: 5.1 mmol/L (ref 3.5–5.3)
SODIUM: 138 mmol/L (ref 135–146)
TOTAL PROTEIN: 7.2 g/dL (ref 6.1–8.1)
Total Bilirubin: 0.3 mg/dL (ref 0.2–1.2)

## 2017-02-01 LAB — CBC WITH DIFFERENTIAL/PLATELET
BASOS PCT: 0.6 %
Basophils Absolute: 42 cells/uL (ref 0–200)
EOS ABS: 224 {cells}/uL (ref 15–500)
EOS PCT: 3.2 %
HCT: 34.5 % — ABNORMAL LOW (ref 35.0–45.0)
Hemoglobin: 11.3 g/dL — ABNORMAL LOW (ref 11.7–15.5)
Lymphs Abs: 1281 cells/uL (ref 850–3900)
MCH: 28.5 pg (ref 27.0–33.0)
MCHC: 32.8 g/dL (ref 32.0–36.0)
MCV: 86.9 fL (ref 80.0–100.0)
MONOS PCT: 9.5 %
MPV: 11.6 fL (ref 7.5–12.5)
Neutro Abs: 4788 cells/uL (ref 1500–7800)
Neutrophils Relative %: 68.4 %
PLATELETS: 251 10*3/uL (ref 140–400)
RBC: 3.97 10*6/uL (ref 3.80–5.10)
RDW: 12.9 % (ref 11.0–15.0)
TOTAL LYMPHOCYTE: 18.3 %
WBC mixed population: 665 cells/uL (ref 200–950)
WBC: 7 10*3/uL (ref 3.8–10.8)

## 2017-02-05 ENCOUNTER — Ambulatory Visit: Payer: Medicare Other | Admitting: Orthopedic Surgery

## 2017-02-05 ENCOUNTER — Telehealth: Payer: Self-pay | Admitting: Orthopedic Surgery

## 2017-02-05 NOTE — Telephone Encounter (Signed)
Patient left [place] a voicemail stating she needed to cancel this appointment. She will call back to reschedule

## 2017-02-07 ENCOUNTER — Encounter (HOSPITAL_COMMUNITY): Payer: Medicare Other

## 2017-02-14 ENCOUNTER — Encounter (HOSPITAL_COMMUNITY): Payer: Medicare Other

## 2017-02-21 ENCOUNTER — Ambulatory Visit (HOSPITAL_COMMUNITY): Payer: Medicare Other

## 2017-02-28 ENCOUNTER — Encounter (HOSPITAL_COMMUNITY): Payer: Medicare Other

## 2017-03-14 ENCOUNTER — Other Ambulatory Visit: Payer: Self-pay | Admitting: Family Medicine

## 2017-06-06 ENCOUNTER — Ambulatory Visit: Payer: Medicare Other | Admitting: Orthopedic Surgery

## 2017-06-06 VITALS — BP 140/87 | HR 78 | Ht 66.0 in | Wt 187.0 lb

## 2017-06-06 DIAGNOSIS — M4316 Spondylolisthesis, lumbar region: Secondary | ICD-10-CM | POA: Diagnosis not present

## 2017-06-06 DIAGNOSIS — M170 Bilateral primary osteoarthritis of knee: Secondary | ICD-10-CM

## 2017-06-06 NOTE — Progress Notes (Signed)
Chief Complaint  Patient presents with  . Follow-up    Recheck om bilateral knees and back.     Procedure note left knee injection verbal consent was obtained to inject left knee joint  Timeout was completed to confirm the site of injection  The medications used were 40 mg of Depo-Medrol and 1% lidocaine 3 cc  Anesthesia was provided by ethyl chloride and the skin was prepped with alcohol.  After cleaning the skin with alcohol a 20-gauge needle was used to inject the left knee joint. There were no complications. A sterile bandage was applied.   Procedure note right knee injection verbal consent was obtained to inject right knee joint  Timeout was completed to confirm the site of injection  The medications used were 40 mg of Depo-Medrol and 1% lidocaine 3 cc  Anesthesia was provided by ethyl chloride and the skin was prepped with alcohol.  After cleaning the skin with alcohol a 20-gauge needle was used to inject the right knee joint. There were no complications. A sterile bandage was applied.  Encounter Diagnoses  Name Primary?  . Spondylolisthesis at L4-L5 level Yes  . Primary osteoarthritis of both knees    Fu prn

## 2017-08-03 ENCOUNTER — Encounter: Payer: Medicare Other | Admitting: Family Medicine

## 2017-11-07 ENCOUNTER — Ambulatory Visit (INDEPENDENT_AMBULATORY_CARE_PROVIDER_SITE_OTHER): Payer: Medicare Other

## 2017-11-07 ENCOUNTER — Ambulatory Visit: Payer: Medicare Other | Admitting: Orthopedic Surgery

## 2017-11-07 ENCOUNTER — Encounter: Payer: Self-pay | Admitting: Orthopedic Surgery

## 2017-11-07 VITALS — BP 146/65 | HR 94 | Ht 66.0 in | Wt 189.0 lb

## 2017-11-07 DIAGNOSIS — M1711 Unilateral primary osteoarthritis, right knee: Secondary | ICD-10-CM

## 2017-11-07 NOTE — Progress Notes (Signed)
Chief Complaint  Patient presents with  . Knee Pain    right knee pain and swelling   \  67 years old history of osteoarthritis right knee comes in complaining of pain and swelling  We have not had an x-ray in a long time so we will do that  Pain is primarily lateral joint line  Review of systems denies chest pain or shortness of breath  BP (!) 146/65   Pulse 94   Ht 5\' 6"  (1.676 m)   Wt 189 lb (85.7 kg)   BMI 30.51 kg/m  Normal development grooming hygiene medium mesomorphic body habitus  Neurovascular exam is intact Exam shows a valgus knee thickened synovium flexion arc 105 degrees knee stable  See dictated report x-rays show valgus knee severe arthritis lateral compartment  I asked the patient about a note total knee she says she wants to wait till next summer so we gave her an injection  Encounter Diagnosis  Name Primary?  . Primary osteoarthritis of right knee Yes    Procedure note right knee injection verbal consent was obtained to inject right knee joint  Timeout was completed to confirm the site of injection  The medications used were 40 mg of Depo-Medrol and 1% lidocaine 3 cc  Anesthesia was provided by ethyl chloride and the skin was prepped with alcohol.  After cleaning the skin with alcohol a 20-gauge needle was used to inject the right knee joint. There were no complications. A sterile bandage was applied.

## 2017-12-19 ENCOUNTER — Other Ambulatory Visit: Payer: Self-pay | Admitting: Family Medicine

## 2018-03-12 ENCOUNTER — Encounter: Payer: Self-pay | Admitting: Orthopedic Surgery

## 2018-03-12 ENCOUNTER — Ambulatory Visit: Payer: Medicare Other | Admitting: Orthopedic Surgery

## 2018-03-12 VITALS — BP 118/61 | HR 63 | Ht 66.0 in | Wt 186.0 lb

## 2018-03-12 DIAGNOSIS — M1711 Unilateral primary osteoarthritis, right knee: Secondary | ICD-10-CM

## 2018-03-12 DIAGNOSIS — M1710 Unilateral primary osteoarthritis, unspecified knee: Secondary | ICD-10-CM

## 2018-03-12 NOTE — Progress Notes (Signed)
PREOP   Chief Complaint  Patient presents with  . Knee Pain    right / injections not helping much anymore, discuss total knee replacement     68 year old female with 2-year history of lateral right knee pain she has had injections and anti-inflammatories activity modification as preoperative nonoperative treatment.  She now has severe pain in the right knee difficulty standing squatting kneeling bending getting out of a chair she cannot walk very far and is interfering with her activities of daily living and lifestyle and she wishes to proceed with knee replacement   Review of Systems  Constitutional: Negative for fever.  Respiratory: Negative for shortness of breath.   Cardiovascular: Negative for chest pain.  Musculoskeletal: Positive for back pain.  Skin: Negative.   Neurological: Negative for tingling and sensory change.     Past Medical History:  Diagnosis Date  . Arthritis   . Heart murmur    asymptomatic  . Hypercholesteremia   . Hypertension     Past Surgical History:  Procedure Laterality Date  . CARPAL TUNNEL RELEASE     bilateral   . CATARACT EXTRACTION W/PHACO Right 09/07/2014   Procedure: CATARACT EXTRACTION PHACO AND INTRAOCULAR LENS PLACEMENT RIGHT EYE CDE=6.10;  Surgeon: Tonny Branch, MD;  Location: [facility];  Service: Ophthalmology;  Laterality: Right;  . COLONOSCOPY  09/15/2011   Procedure: COLONOSCOPY;  Surgeon: Danie Binder, MD;  Location: [facility];  Service: Endoscopy;  Laterality: N/A;  11:00AM  . TUBAL LIGATION      Family History  Problem Relation Age of Onset  . Heart disease Father   . Stroke Father   . Hypertension Sister   . Hypertension Sister   . Heart disease Brother   . Hypertension Mother    Social History   Tobacco Use  . Smoking status: Former Smoker    Packs/day: 0.30    Years: 35.00    Pack years: 10.50    Types: Cigarettes    Last attempt to quit: 08/22/1996    Years since quitting: 21.5  . Smokeless tobacco: Never Used   Substance Use Topics  . Alcohol use: No  . Drug use: No    No Known Allergies   Current Meds  Medication Sig  . aspirin 81 MG tablet Take 1 tablet (81 mg total) by mouth daily.  . Calcium Carb-Cholecalciferol (CALCIUM + D3) 600-200 MG-UNIT TABS Take by mouth 2 (two) times daily.  Marland Kitchen esomeprazole (NEXIUM) 40 MG capsule Take 1 capsule (40 mg total) by mouth daily.  Marland Kitchen ibuprofen (ADVIL,MOTRIN) 200 MG tablet Take 400 mg by mouth every 6 (six) hours as needed.  Marland Kitchen lisinopril-hydrochlorothiazide (PRINZIDE,ZESTORETIC) 20-25 MG tablet TAKE 1 TABLET BY MOUTH ONCE DAILY  . Multiple Vitamins-Minerals (MULTIVITAMIN WITH MINERALS) tablet Take 1 tablet by mouth daily.  . simvastatin (ZOCOR) 20 MG tablet Take 1 tablet (20 mg total) by mouth at bedtime.    BP 118/61   Pulse 63   Ht 5\' 6"  (1.676 m)   Wt 186 lb (84.4 kg)   BMI 30.02 kg/m   Physical Exam Vitals signs reviewed.  Constitutional:      Appearance: She is well-developed.  Neurological:     Mental Status: She is alert and oriented to person, place, and time.     Gait: Gait normal.     Right Knee Exam   Muscle Strength  The patient has normal right knee strength.  Tenderness  The patient is experiencing tenderness in the lateral joint line.  Range of Motion  Extension:  5 normal  Flexion:  90 normal   Tests  McMurray:  Medial - negative Lateral - negative Varus: negative Valgus: negative Drawer:  Anterior - negative    Posterior - negative  Other  Erythema: absent Scars: absent Sensation: normal Pulse: present Swelling: none  Comments:  Valgus knee no pseudolaxity   Left Knee Exam   Muscle Strength  The patient has normal left knee strength.  Tenderness  The patient is experiencing no tenderness.   Range of Motion  Extension: normal  Flexion: normal   Tests  McMurray:  Medial - negative Lateral - negative Varus: negative Valgus: negative Drawer:  Anterior - negative     Posterior - negative  Other   Erythema: absent Scars: absent Sensation: normal Pulse: present Swelling: none      MEDICAL DECISION SECTION  xrays ordered? no  My independent reading of xrays: valgus knee severe OA    Encounter Diagnosis  Name Primary?  . Primary localized osteoarthritis of knee Yes     PLAN:   Surgical procedure planned: Right total knee arthroplasty  The procedure has been fully reviewed with the patient; The risks and benefits of surgery have been discussed and explained and understood. Alternative treatment has also been reviewed, questions were encouraged and answered. The postoperative plan is also been reviewed.  Nonsurgical treatment as described in the history and physical section was attempted and unsuccessful and the patient has agreed to proceed with surgical intervention to improve their situation.  No orders of the defined types were placed in this encounter.   Arther Abbott, MD 03/12/2018 2:19 PM

## 2018-03-12 NOTE — Patient Instructions (Signed)
You have decided to proceed with knee replacement surgery. You have decided not to continue with nonoperative measures such as but not limited to oral medication, weight loss, activity modification, physical therapy, bracing, or injection.  We will perform the procedure commonly known as total knee replacement. Some of the risks associated with knee replacement surgery include but are not limited to Bleeding Infection Swelling Stiffness Blood clot Pulmonary embolism  Loosening of the implant Pain that persists even after surgery  Infection is especially devastating complication of knee surgery although rare. If infection does occur your implant will usually have to be removed and several surgeries and antibiotics will be needed to eradicate the infection prior to performing a repeat replacement.   In some cases amputation is required to eradicate the infection. In other rare cases a knee fusion is needed   In compliance with recent [place] law in federal regulation regarding opioid use and abuse and addiction, we will taper (stop) opioid medication after 2 weeks.  If you're not comfortable with these risks and would like to continue with nonoperative treatment please let Dr. Avery Eustice know prior to your surgery.  

## 2018-03-13 ENCOUNTER — Encounter: Payer: Medicare Other | Admitting: Family Medicine

## 2018-03-13 ENCOUNTER — Ambulatory Visit (INDEPENDENT_AMBULATORY_CARE_PROVIDER_SITE_OTHER): Payer: Medicare Other | Admitting: Family Medicine

## 2018-03-13 ENCOUNTER — Encounter: Payer: Self-pay | Admitting: Family Medicine

## 2018-03-13 VITALS — BP 128/74 | HR 68 | Temp 98.5°F | Resp 14 | Ht 66.0 in | Wt 189.0 lb

## 2018-03-13 DIAGNOSIS — Z6830 Body mass index (BMI) 30.0-30.9, adult: Secondary | ICD-10-CM

## 2018-03-13 DIAGNOSIS — Z1239 Encounter for other screening for malignant neoplasm of breast: Secondary | ICD-10-CM

## 2018-03-13 DIAGNOSIS — E6609 Other obesity due to excess calories: Secondary | ICD-10-CM | POA: Diagnosis not present

## 2018-03-13 DIAGNOSIS — N182 Chronic kidney disease, stage 2 (mild): Secondary | ICD-10-CM

## 2018-03-13 DIAGNOSIS — E7800 Pure hypercholesterolemia, unspecified: Secondary | ICD-10-CM | POA: Diagnosis not present

## 2018-03-13 DIAGNOSIS — Z0000 Encounter for general adult medical examination without abnormal findings: Secondary | ICD-10-CM | POA: Diagnosis not present

## 2018-03-13 DIAGNOSIS — I6523 Occlusion and stenosis of bilateral carotid arteries: Secondary | ICD-10-CM

## 2018-03-13 DIAGNOSIS — I10 Essential (primary) hypertension: Secondary | ICD-10-CM

## 2018-03-13 NOTE — Progress Notes (Signed)
Subjective:   Patient presents for Medicare Annual/Subsequent preventive examination.    OA- planning for right knee replacement on March 10th, will go home with therapy    HTN- taking bp meds without any difficulty, checks at home, has been "good"   Carotid artery - she has not been taking her zocor, no specific reason   Review Past Medical/Family/Social: per EMR   Risk Factors  Current exercise habits: little due to knee Dietary issues discussed: Yes  Cardiac risk factors: Obesity (BMI >= 30 kg/m2). , HTN, CAD, Hyperlipidemia  Depression Screen  (Note: if answer to either of the following is "Yes", a more complete depression screening is indicated)  Over the past two weeks, have you felt down, depressed or hopeless? No Over the past two weeks, have you felt little interest or pleasure in doing things? No Have you lost interest or pleasure in daily life? No Do you often feel hopeless? No Do you cry easily over simple problems? No   Activities of Daily Living  In your present state of health, do you have any difficulty performing the following activities?:  Driving? No  Managing money? No  Feeding yourself? No  Getting from bed to chair? No  Climbing a flight of stairs? yes Preparing food and eating?: No  Bathing or showering? No  Getting dressed: No  Getting to the toilet? No  Using the toilet:No  Moving around from place to place: No  In the past year have you fallen or had a near fall?:No  Are you sexually active? No  Do you have more than one partner? No  No urinary problems or incontinence   Hearing Difficulties: No  Do you often ask people to speak up or repeat themselves? No  Do you experience ringing or noises in your ears? No Do you have difficulty understanding soft or whispered voices? No  Do you feel that you have a problem with memory? No Do you often misplace items? No  Do you feel safe at home? Yes  Cognitive Testing  Alert? Yes Normal  Appearance?Yes  Oriented to person? Yes Place? Yes  Time? Yes  Recall of three objects? Yes  Can perform simple calculations? Yes  Displays appropriate judgment?Yes  Can read the correct time from a watch face?Yes   List the Names of Other Physician/Practitioners you currently use:  Dr. Aline Brochure- Orthopedics  Screening Tests / Date Colonoscopy       UTD               Shingrix- UTD Mammogram UTD  Bone Density- UTD Pneumonia- UTD  Tetanus/tdap UTD  ROS: GEN- Denies fatigue, denies  fever, weight loss,weakness, recent illness HEENT- denies eye drainage, change in vision, nasal discharge, CVS- denies chest pain, palpitations RESP- denies SOB, cough, wheeze ABD- denies N/V, change in stools, abd pain GU- denies dysuria, hematuria, dribbling, incontinence MSK- + joint pain, muscle aches, injury Neuro- denies headache, dizziness, syncope, seizure activity  Physical:, vitals reviewed  GEN- NAD, alert and oriented x3 HEENT- PERRL, EOMI, non injected sclera, pink conjunctiva, MMM, oropharynx clear, tm mild wax left side, no impaction, hearing grossly in tact  Neck- Supple, no thryomegaly CVS- RRR, no murmur RESP-CTAB ABD-NABS,soft,NT,ND EXT- traece ankle edema Pulses- Radial, DP- 2+    Assessment:    Annual wellness medicare exam   Plan:    During the course of the visit the patient was educated and counseled about appropriate screening and preventive services including:   Fall/AuditC/depression screen negative  OA knee- knee surgery, she is medically cleared for surgery, can stop aspirin based on orthopedics reccomendations  HTN- well controlled, check renal function  Hyperlipidemia/ carotid artery disease- discussed importance of statin drug, pt to resume this . Plan for carotid duplex in the fall   Bone Density normal 2018 on calcium and vitamin D   Mammogram- pt to schedule by June 2020  Gerd- continue nexium as needed, continues to have benefit from this    Discussed advanced directives, given handout she will review with her kids             Diet review for nutrition referral? Yes ____ Not Indicated __x__  Patient Instructions (the written plan) was given to the patient.  Medicare Attestation  I have personally reviewed:  The patient's medical and social history  Their use of alcohol, tobacco or illicit drugs  Their current medications and supplements  The patient's functional ability including ADLs,fall risks, home safety risks, cognitive, and hearing and visual impairment  Diet and physical activities  Evidence for depression or mood disorders  The patient's weight, height, BMI, and visual acuity have been recorded in the chart. I have made referrals, counseling, and provided education to the patient based on review of the above and I have provided the patient with a written personalized care plan for preventive services.

## 2018-03-13 NOTE — Patient Instructions (Addendum)
Take cholesterol medication every night  Continue all other vitamins and medications  Schedule Mammogram for June  F/U 6 months

## 2018-03-14 LAB — COMPREHENSIVE METABOLIC PANEL
AG RATIO: 1.2 (calc) (ref 1.0–2.5)
ALKALINE PHOSPHATASE (APISO): 57 U/L (ref 37–153)
ALT: 15 U/L (ref 6–29)
AST: 16 U/L (ref 10–35)
Albumin: 3.8 g/dL (ref 3.6–5.1)
BILIRUBIN TOTAL: 0.3 mg/dL (ref 0.2–1.2)
BUN/Creatinine Ratio: 22 (calc) (ref 6–22)
BUN: 32 mg/dL — ABNORMAL HIGH (ref 7–25)
CHLORIDE: 106 mmol/L (ref 98–110)
CO2: 25 mmol/L (ref 20–32)
Calcium: 9.7 mg/dL (ref 8.6–10.4)
Creat: 1.43 mg/dL — ABNORMAL HIGH (ref 0.50–0.99)
GLOBULIN: 3.1 g/dL (ref 1.9–3.7)
Glucose, Bld: 100 mg/dL — ABNORMAL HIGH (ref 65–99)
POTASSIUM: 4.8 mmol/L (ref 3.5–5.3)
Sodium: 138 mmol/L (ref 135–146)
Total Protein: 6.9 g/dL (ref 6.1–8.1)

## 2018-03-14 LAB — LIPID PANEL
CHOLESTEROL: 208 mg/dL — AB (ref <200)
HDL: 30 mg/dL — AB (ref >=50)
LDL Cholesterol (Calc): 145 mg/dL (calc) — ABNORMAL HIGH
NON-HDL CHOLESTEROL (CALC): 178 mg/dL — AB (ref <130)
Total CHOL/HDL Ratio: 6.9 (calc) — ABNORMAL HIGH (ref <5.0)
Triglycerides: 193 mg/dL — ABNORMAL HIGH (ref <150)

## 2018-03-14 LAB — CBC WITH DIFFERENTIAL/PLATELET
ABSOLUTE MONOCYTES: 882 {cells}/uL (ref 200–950)
BASOS ABS: 34 {cells}/uL (ref 0–200)
Basophils Relative: 0.4 %
EOS ABS: 252 {cells}/uL (ref 15–500)
Eosinophils Relative: 3 %
HCT: 32.5 % — ABNORMAL LOW (ref 35.0–45.0)
Hemoglobin: 10.6 g/dL — ABNORMAL LOW (ref 11.7–15.5)
Lymphs Abs: 1302 cells/uL (ref 850–3900)
MCH: 28.5 pg (ref 27.0–33.0)
MCHC: 32.6 g/dL (ref 32.0–36.0)
MCV: 87.4 fL (ref 80.0–100.0)
MPV: 12.1 fL (ref 7.5–12.5)
Monocytes Relative: 10.5 %
NEUTROS PCT: 70.6 %
Neutro Abs: 5930 cells/uL (ref 1500–7800)
Platelets: 266 10*3/uL (ref 140–400)
RBC: 3.72 10*6/uL — ABNORMAL LOW (ref 3.80–5.10)
RDW: 12.6 % (ref 11.0–15.0)
TOTAL LYMPHOCYTE: 15.5 %
WBC: 8.4 10*3/uL (ref 3.8–10.8)

## 2018-03-21 ENCOUNTER — Other Ambulatory Visit: Payer: Self-pay | Admitting: *Deleted

## 2018-03-21 DIAGNOSIS — D649 Anemia, unspecified: Secondary | ICD-10-CM

## 2018-03-25 ENCOUNTER — Other Ambulatory Visit: Payer: Medicare Other

## 2018-03-25 ENCOUNTER — Other Ambulatory Visit: Payer: Self-pay | Admitting: Family Medicine

## 2018-03-25 DIAGNOSIS — D649 Anemia, unspecified: Secondary | ICD-10-CM

## 2018-03-26 LAB — IRON,TIBC AND FERRITIN PANEL
%SAT: 21 % (ref 16–45)
FERRITIN: 293 ng/mL — AB (ref 16–288)
Iron: 59 ug/dL (ref 45–160)
TIBC: 281 ug/dL (ref 250–450)

## 2018-03-28 ENCOUNTER — Telehealth: Payer: Self-pay | Admitting: Radiology

## 2018-03-28 DIAGNOSIS — M1711 Unilateral primary osteoarthritis, right knee: Secondary | ICD-10-CM

## 2018-03-28 NOTE — Telephone Encounter (Signed)
Started a prior auth for [facility] Medicare for her, online  [id number] is the case number for CPT code [id number]

## 2018-03-28 NOTE — Patient Instructions (Signed)
Robin Villa  03/28/2018     @PREFPERIOPPHARMACY @   Your procedure is scheduled on  04/09/2018  Report to [facility] at  615   A.M.  Call this number if you have problems the morning of surgery:  [phone]   Remember:  Do not eat or drink after midnight.                        Take these medicines the morning of surgery with A SIP OF WATER  nexium.    Do not wear jewelry, make-up or nail polish.  Do not wear lotions, powders, or perfumes, or deodorant.  Do not shave 48 hours prior to surgery.  Men may shave face and neck.  Do not bring valuables to the hospital.  [facility] is not responsible for any belongings or valuables.  Contacts, dentures or bridgework may not be worn into surgery.  Leave your suitcase in the car.  After surgery it may be brought to your room.  For patients admitted to the hospital, discharge time will be determined by your treatment team.  Patients discharged the day of surgery will not be allowed to drive home.   Name and phone number of your driver:   family Special instructions:  None  Please read over the following fact sheets that you were given. Pain Booklet, Coughing and Deep Breathing, Blood Transfusion Information, Total Joint Packet, MRSA Information, Surgical Site Infection Prevention, Anesthesia Post-op Instructions and Care and Recovery After Surgery       Total Knee Replacement, Care After Refer to this sheet in the next few weeks. These instructions provide you with information about caring for yourself after your procedure. Your health care provider may also give you more specific instructions. Your treatment has been planned according to current medical practices, but problems sometimes occur. Call your health care provider if you have any problems or questions after your procedure. What can I expect after the procedure? After the procedure, it is common to have:  Pain and swelling.  A small amount of blood or clear  fluid coming from your incision.  Limited range of motion. Follow these instructions at home: Medicines  Take over-the-counter and prescription medicines only as told by your health care provider.  If you were prescribed an antibiotic medicine, take it as told by your health care provider. Do not stop taking the antibiotic even if you start to feel better.  If you were prescribed a blood thinner (anticoagulant), take it as told by your health care provider. Bathing  Do not take baths, swim, or use a hot tub until your health care provider approves. Ask your health care provider if you can take showers. You may only be allowed to take sponge baths for bathing.  If you have an immobilizer that is not waterproof, cover it with a watertight covering when you take a bath or shower.  Keep your bandage (dressing) dry until your health care provider says it can be removed. Incision care and drain care   Check your incision area and drain site every day for signs of infection. Check for: ? More redness, swelling, or pain. ? More fluid or blood. ? Warmth. ? Pus or a bad smell.  Follow instructions from your health care provider about how to take care of your incision. Make sure you: ? Wash your hands with soap and water before you change your dressing. If  soap and water are not available, use alcohol-based hand sanitizer. ? Change your dressing as told by your health care provider. ? Leave stitches (sutures), skin glue, or adhesive strips in place. These skin closures may need to stay in place for 2 weeks or longer. If adhesive strip edges start to loosen and curl up, you may trim the loose edges. Do not remove adhesive strips completely unless your health care provider tells you to do that.  If you have a drain, follow instructions from your health care provider about caring for it. Do not remove the drain tube or any dressings around the tube opening unless your health care provider  approves. Managing pain, stiffness, and swelling      If directed, put ice on your knee. ? Put ice in a plastic bag or use the icing device (cold flow pad or cryocuff) that you were given. Follow instructions from your health care provider about how to use the icing device. ? Place a towel between your skin and the bag or between your skin and the icing device. ? Leave the ice on for 20 minutes, 2-3 times per day.  If directed, apply heat to the affected area as often as told by your health care provider. Use the heat source that your health care provider recommends, such as a moist heat pack or a heating pad. ? Place a towel between your skin and the heat source. ? Leave the heat on for 20-30 minutes. ? Remove the heat if your skin turns bright red. This is especially important if you are unable to feel pain, heat, or cold. You may have a greater risk of getting burned.  Move your toes often to avoid stiffness and to lessen swelling.  Raise (elevate) your knee above the level of your heart while you are sitting or lying down.  Wear elastic knee support for as long as told by your health care provider. Driving   Do not drive until your health care provider approves. Ask your health care provider when it is safe to drive if you have an immobilizer on your knee.  Do not drive or operate heavy machinery while taking prescription pain medicine.  Do not drive for 24 hours if you received a sedative. Activity  Do not play contact sports until your health care provider approves.  Avoid high-impact activities, including running, jumping rope, and jumping jacks.  Avoid sitting for a long time without moving. Get up and move around at least every few hours.  If physical therapy was prescribed, do exercises as told by your health care provider.  Return to your normal activities as told by your health care provider. Ask your health care provider what activities are safe for  you. Safety  Do not use your leg to support your body weight until your health care provider approves. Use crutches or a walker as told by your health care provider. General instructions  Do not have any dental work done for at least 3 months after your surgery. When you do have dental work done, tell your dentist about your joint replacement.  Do not use any tobacco products, such as cigarettes, chewing tobacco, or e-cigarettes. If you need help quitting, ask your health care provider.  Wear compression stockings as told by your health care provider. These stockings help to prevent blood clots and reduce swelling in your legs.  If you have been sent home with a continuous passive motion machine, use it as told by your  health care provider.  Drink enough fluid to keep your urine clear or pale yellow.  If you have been instructed to lose weight, follow instructions from your health care provider about how to do this safely.  Keep all follow-up visits as told by your health care provider. This is important. Contact a health care provider if:  You have more redness, swelling, or pain around your incision or drain.  You have more fluid or blood coming from your incision or drain.  Your incision or drain site feels warm to the touch.  You have pus or a bad smell coming from your incision or drain.  You have a fever.  Your incision breaks open after your health care provider removes your sutures, skin glue, or adhesive tape.  Your prosthesis feels loose.  You have knee pain that does not go away. Get help right away if:  You have a rash.  You have pain or swelling in your calf or thigh.  You have shortness of breath or difficulty breathing.  You have chest pain.  Your range of motion in your knee is getting worse. This information is not intended to replace advice given to you by your health care provider. Make sure you discuss any questions you have with your health care  provider. Document Released: 08/05/2004 Document Revised: 06/11/2016 Document Reviewed: 12/23/2014 Elsevier Interactive Patient Education  2019 [place] Anesthesia, Adult, Care After This sheet gives you information about how to care for yourself after your procedure. Your health care provider may also give you more specific instructions. If you have problems or questions, contact your health care provider. What can I expect after the procedure? After the procedure, the following side effects are common:  Pain or discomfort at the IV site.  Nausea.  Vomiting.  Sore throat.  Trouble concentrating.  Feeling cold or chills.  Weak or tired.  Sleepiness and fatigue.  Soreness and body aches. These side effects can affect parts of the body that were not involved in surgery. Follow these instructions at home:  For at least 24 hours after the procedure:  Have a responsible adult stay with you. It is important to have someone help care for you until you are awake and alert.  Rest as needed.  Do not: ? Participate in activities in which you could fall or become injured. ? Drive. ? Use heavy machinery. ? Drink alcohol. ? Take sleeping pills or medicines that cause drowsiness. ? Make important decisions or sign legal documents. ? Take care of children on your own. Eating and drinking  Follow any instructions from your health care provider about eating or drinking restrictions.  When you feel hungry, start by eating small amounts of foods that are soft and easy to digest (bland), such as toast. Gradually return to your regular diet.  Drink enough fluid to keep your urine pale yellow.  If you vomit, rehydrate by drinking water, juice, or clear broth. General instructions  If you have sleep apnea, surgery and certain medicines can increase your risk for breathing problems. Follow instructions from your health care provider about wearing your sleep  device: ? Anytime you are sleeping, including during daytime naps. ? While taking prescription pain medicines, sleeping medicines, or medicines that make you drowsy.  Return to your normal activities as told by your health care provider. Ask your health care provider what activities are safe for you.  Take over-the-counter and prescription medicines only as told by your health care  provider.  If you smoke, do not smoke without supervision.  Keep all follow-up visits as told by your health care provider. This is important. Contact a health care provider if:  You have nausea or vomiting that does not get better with medicine.  You cannot eat or drink without vomiting.  You have pain that does not get better with medicine.  You are unable to pass urine.  You develop a skin rash.  You have a fever.  You have redness around your IV site that gets worse. Get help right away if:  You have difficulty breathing.  You have chest pain.  You have blood in your urine or stool, or you vomit blood. Summary  After the procedure, it is common to have a sore throat or nausea. It is also common to feel tired.  Have a responsible adult stay with you for the first 24 hours after general anesthesia. It is important to have someone help care for you until you are awake and alert.  When you feel hungry, start by eating small amounts of foods that are soft and easy to digest (bland), such as toast. Gradually return to your regular diet.  Drink enough fluid to keep your urine pale yellow.  Return to your normal activities as told by your health care provider. Ask your health care provider what activities are safe for you. This information is not intended to replace advice given to you by your health care provider. Make sure you discuss any questions you have with your health care provider. Document Released: 04/24/2000 Document Revised: 09/01/2016 Document Reviewed: 09/01/2016 Elsevier  Interactive Patient Education  2019 [place]. Spinal Anesthesia and Epidural Anesthesia, Care After This sheet gives you information about how to care for yourself after your procedure. Your doctor may also give you more specific instructions. If you have problems or questions, call your doctor. Follow these instructions at home: For at least 24 hours after the procedure:   Have a responsible adult stay with you. It is important to have someone help care for you until you are awake and alert.  Rest as needed.  Do not do activities where you could fall or get hurt (injured).  Do not drive.  Do not use heavy machinery.  Do not drink alcohol.  Do not take sleeping pills or medicines that make you sleepy (drowsy).  Do not make important decisions.  Do not sign legal documents.  Do not take care of children on your own. Eating and drinking  If you throw up (vomit), drink water, juice, or soup when nausea and vomiting stop.  Drink enough fluid to keep your pee (urine) pale yellow.  Make sure you do not feel like throwing up (nauseous) before you eat solid foods.  Follow the diet that your doctor recommends. General instructions  Return to your normal activities as told by your doctor. Ask your doctor what activities are safe for you.  Take over-the-counter and prescription medicines only as told by your doctor.  If you have sleep apnea, surgery and certain medicines can raise your risk for breathing problems. Follow instructions from your doctor about when to wear your sleep device. Your doctor may tell you to wear your sleep device: ? Anytime you are sleeping, including during daytime naps. ? While taking prescription pain medicines, sleeping pills, or medicines that make you sleepy.  Do not use any products that contain nicotine or tobacco. This includes cigarettes and e-cigarettes. ? If you need help quitting,  ask your doctor. ? If you smoke, do not smoke by yourself.  Make sure someone is nearby in case you need help.  Keep all follow-up visits as told by your doctor. This is important. Contact a doctor if:  It has been more than one day since your procedure and you feel like throwing up.  It has been more than one day since your procedure and you throw up.  You have a rash. Get help right away if:  You have a fever.  You have a headache that lasts a long time.  You have a very bad headache.  Your vision is blurry.  You see two of a single object (double vision).  You are dizzy or light-headed.  You faint.  Your arms or legs tingle, feel weak, or get numb.  You have trouble breathing.  You cannot pee (urinate). Summary  After the procedure, have a responsible adult stay with you at home until you are fully awake and alert.  Do not do activities that might get you injured. Do not drive, use heavy machinery, drink alcohol, or make important decisions for 24 hours after the procedure.  Take medicines as told by your doctor. Do not use products that contain nicotine or tobacco.  Get help right away if you have a fever, blurry vision, difficulty breathing or passing urine, or weakness or numbness in arms or legs. This information is not intended to replace advice given to you by your health care provider. Make sure you discuss any questions you have with your health care provider. Document Released: 05/10/2015 Document Revised: 08/30/2016 Document Reviewed: 05/10/2015 Elsevier Interactive Patient Education  2019 [employer].

## 2018-03-29 ENCOUNTER — Other Ambulatory Visit: Payer: Self-pay | Admitting: Orthopedic Surgery

## 2018-03-29 DIAGNOSIS — M1711 Unilateral primary osteoarthritis, right knee: Secondary | ICD-10-CM

## 2018-03-29 NOTE — Telephone Encounter (Signed)
Faxed clinicals to [phone] phone number is [place]

## 2018-04-02 ENCOUNTER — Telehealth: Payer: Self-pay | Admitting: Orthopedic Surgery

## 2018-04-02 NOTE — Telephone Encounter (Signed)
Aware, it is posted as 23 hour obs Outpatient. I have left message for her to advise.

## 2018-04-02 NOTE — Telephone Encounter (Signed)
approved

## 2018-04-02 NOTE — Telephone Encounter (Signed)
Larena Glassman from [facility] called and wanted to speak to you regarding approval of Robin Villa's surgery.  She said the clinicals have been reviewed but patient does not meet criteria for inpatient.  She would like for you to call her so that you may discuss this.  Trisha's phone number is [phone]  Thanks

## 2018-04-03 NOTE — Telephone Encounter (Signed)
Approved has been put in computer

## 2018-04-04 ENCOUNTER — Other Ambulatory Visit: Payer: Self-pay

## 2018-04-04 ENCOUNTER — Encounter (HOSPITAL_COMMUNITY)
Admission: RE | Admit: 2018-04-04 | Discharge: 2018-04-04 | Disposition: A | Discharge status: Home / Self Care | Payer: Medicare Other | Source: Ambulatory Visit | Attending: Orthopedic Surgery | Admitting: Orthopedic Surgery

## 2018-04-04 ENCOUNTER — Encounter (HOSPITAL_COMMUNITY): Payer: Self-pay

## 2018-04-04 DIAGNOSIS — Z01818 Encounter for other preprocedural examination: Secondary | ICD-10-CM | POA: Insufficient documentation

## 2018-04-04 LAB — CBC WITH DIFFERENTIAL/PLATELET
ABS IMMATURE GRANULOCYTES: 0.03 10*3/uL (ref 0.00–0.07)
BASOS PCT: 0 %
Basophils Absolute: 0 10*3/uL (ref 0.0–0.1)
Eosinophils Absolute: 0.3 10*3/uL (ref 0.0–0.5)
Eosinophils Relative: 3 %
HCT: 32.9 % — ABNORMAL LOW (ref 36.0–46.0)
Hemoglobin: 10.4 g/dL — ABNORMAL LOW (ref 12.0–15.0)
Immature Granulocytes: 0 %
Lymphocytes Relative: 14 %
Lymphs Abs: 1.2 10*3/uL (ref 0.7–4.0)
MCH: 28.3 pg (ref 26.0–34.0)
MCHC: 31.6 g/dL (ref 30.0–36.0)
MCV: 89.4 fL (ref 80.0–100.0)
MONOS PCT: 9 %
Monocytes Absolute: 0.8 10*3/uL (ref 0.1–1.0)
Neutro Abs: 6.4 10*3/uL (ref 1.7–7.7)
Neutrophils Relative %: 74 %
Platelets: 226 10*3/uL (ref 150–400)
RBC: 3.68 MIL/uL — ABNORMAL LOW (ref 3.87–5.11)
RDW: 13.1 % (ref 11.5–15.5)
WBC: 8.8 10*3/uL (ref 4.0–10.5)
nRBC: 0 % (ref 0.0–0.2)

## 2018-04-04 LAB — BASIC METABOLIC PANEL
Anion gap: 7 (ref 5–15)
BUN: 32 mg/dL — ABNORMAL HIGH (ref 8–23)
CO2: 21 mmol/L — ABNORMAL LOW (ref 22–32)
Calcium: 9 mg/dL (ref 8.9–10.3)
Chloride: 111 mmol/L (ref 98–111)
Creatinine, Ser: 1.49 mg/dL — ABNORMAL HIGH (ref 0.44–1.00)
GFR calc Af Amer: 41 mL/min — ABNORMAL LOW (ref >60)
GFR, EST NON AFRICAN AMERICAN: 35 mL/min — AB (ref >60)
GLUCOSE: 86 mg/dL (ref 70–99)
Potassium: 4.3 mmol/L (ref 3.5–5.1)
Sodium: 139 mmol/L (ref 135–145)

## 2018-04-04 LAB — SURGICAL PCR SCREEN
MRSA, PCR: NEGATIVE
STAPHYLOCOCCUS AUREUS: NEGATIVE

## 2018-04-04 LAB — ABO/RH: ABO/RH(D): O POS

## 2018-04-04 LAB — PREPARE RBC (CROSSMATCH)

## 2018-04-08 ENCOUNTER — Encounter: Payer: Self-pay | Admitting: Family Medicine

## 2018-04-08 NOTE — H&P (Signed)
PREOP        Chief Complaint  Patient presents with  . Knee Pain      right / injections not helping much anymore, discuss total knee replacement       68 year old female with 2-year history of lateral right knee pain she has had injections and anti-inflammatories activity modification as preoperative nonoperative treatment.  She now has severe pain in the right knee difficulty standing squatting kneeling bending getting out of a chair she cannot walk very far and is interfering with her activities of daily living and lifestyle and she wishes to proceed with knee replacement     Review of Systems  Constitutional: Negative for fever.  Respiratory: Negative for shortness of breath.   Cardiovascular: Negative for chest pain.  Musculoskeletal: Positive for back pain.  Skin: Negative.   Neurological: Negative for tingling and sensory change.            Past Medical History:  Diagnosis Date  . Arthritis    . Heart murmur      asymptomatic  . Hypercholesteremia    . Hypertension        Past Surgical History:  Procedure Laterality Date  . CARPAL TUNNEL RELEASE        bilateral   . CATARACT EXTRACTION W/PHACO Right 09/07/2014    Procedure: CATARACT EXTRACTION PHACO AND INTRAOCULAR LENS PLACEMENT RIGHT EYE CDE=6.10;  Surgeon: Tonny Branch, MD;  Location: [facility] ORS;  Service: Ophthalmology;  Laterality: Right;  . COLONOSCOPY   09/15/2011    Procedure: COLONOSCOPY;  Surgeon: Danie Binder, MD;  Location: [facility] END[facility] SUITE;  Service: Endoscopy;  Laterality: N/A;  11:00AM  . TUBAL LIGATION               Family History  Problem Relation Age of Onset  . Heart disease Father    . Stroke Father    . Hypertension Sister    . Hypertension Sister    . Heart disease Brother    . Hypertension Mother      Social History    Tobacco Use  . Smoking status: Former Smoker      Packs/day: 0.30      Years: 35.00      Pack years: 10.50      Types: Cigarettes      Last attempt to quit: 08/22/1996        Years since quitting: 21.5  . Smokeless tobacco: Never Used  Substance Use Topics  . Alcohol use: No  . Drug use: No      No Known Allergies     Active Medications      Current Meds  Medication Sig  . aspirin 81 MG tablet Take 1 tablet (81 mg total) by mouth daily.  . Calcium Carb-Cholecalciferol (CALCIUM + D3) 600-200 MG-UNIT TABS Take by mouth 2 (two) times daily.  Marland Kitchen esomeprazole (NEXIUM) 40 MG capsule Take 1 capsule (40 mg total) by mouth daily.  Marland Kitchen ibuprofen (ADVIL,MOTRIN) 200 MG tablet Take 400 mg by mouth every 6 (six) hours as needed.  Marland Kitchen lisinopril-hydrochlorothiazide (PRINZIDE,ZESTORETIC) 20-25 MG tablet TAKE 1 TABLET BY MOUTH ONCE DAILY  . Multiple Vitamins-Minerals (MULTIVITAMIN WITH MINERALS) tablet Take 1 tablet by mouth daily.  . simvastatin (ZOCOR) 20 MG tablet Take 1 tablet (20 mg total) by mouth at bedtime.        BP 118/61   Pulse 63   Ht 5\' 6"  (1.676 m)   Wt 186 lb (84.4 kg)   BMI 30.02  kg/m    Physical Exam Vitals signs reviewed.  Constitutional:      Appearance: She is well-developed.  Neurological:     Mental Status: She is alert and oriented to person, place, and time.     Gait: Gait normal.       Right Knee Exam    Muscle Strength  The patient has normal right knee strength.   Tenderness  The patient is experiencing tenderness in the lateral joint line.   Range of Motion  Extension:  5 normal  Flexion:  90 normal    Tests  McMurray:  Medial - negative Lateral - negative Varus: negative Valgus: negative Drawer:  Anterior - negative    Posterior - negative   Other  Erythema: absent Scars: absent Sensation: normal Pulse: present Swelling: none   Comments:  Valgus knee no pseudolaxity     Left Knee Exam    Muscle Strength  The patient has normal left knee strength.   Tenderness  The patient is experiencing no tenderness.    Range of Motion  Extension: normal  Flexion: normal    Tests  McMurray:  Medial - negative  Lateral - negative Varus: negative Valgus: negative Drawer:  Anterior - negative     Posterior - negative   Other  Erythema: absent Scars: absent Sensation: normal Pulse: present Swelling: none           MEDICAL DECISION SECTION  xrays ordered? no   My independent reading of xrays: valgus knee severe OA          Encounter Diagnosis  Name Primary?  . Primary localized osteoarthritis of knee Yes        PLAN:    Surgical procedure planned: Right total knee arthroplasty   The procedure has been fully reviewed with the patient; The risks and benefits of surgery have been discussed and explained and understood. Alternative treatment has also been reviewed, questions were encouraged and answered. The postoperative plan is also been reviewed.   Nonsurgical treatment as described in the history and physical section was attempted and unsuccessful and the patient has agreed to proceed with surgical intervention to improve their situation.   No orders of the defined types were placed in this encounter.     Arther Abbott, MD

## 2018-04-09 ENCOUNTER — Other Ambulatory Visit: Payer: Self-pay

## 2018-04-09 ENCOUNTER — Ambulatory Visit (HOSPITAL_COMMUNITY): Payer: Medicare Other | Admitting: Anesthesiology

## 2018-04-09 ENCOUNTER — Encounter (HOSPITAL_COMMUNITY)
Admission: RE | Disposition: A | Discharge status: Home / Self Care | Payer: Self-pay | Source: Home / Self Care | Attending: Orthopedic Surgery

## 2018-04-09 ENCOUNTER — Observation Stay (HOSPITAL_COMMUNITY)
Admission: RE | Admit: 2018-04-09 | Discharge: 2018-04-11 | Disposition: A | Discharge status: Home / Self Care | Payer: Medicare Other | Attending: Orthopedic Surgery | Admitting: Orthopedic Surgery

## 2018-04-09 ENCOUNTER — Encounter (HOSPITAL_COMMUNITY): Payer: Self-pay | Admitting: *Deleted

## 2018-04-09 ENCOUNTER — Ambulatory Visit (HOSPITAL_COMMUNITY): Payer: Medicare Other

## 2018-04-09 DIAGNOSIS — R2681 Unsteadiness on feet: Secondary | ICD-10-CM | POA: Diagnosis not present

## 2018-04-09 DIAGNOSIS — R011 Cardiac murmur, unspecified: Secondary | ICD-10-CM | POA: Insufficient documentation

## 2018-04-09 DIAGNOSIS — M1711 Unilateral primary osteoarthritis, right knee: Principal | ICD-10-CM | POA: Insufficient documentation

## 2018-04-09 DIAGNOSIS — Z87891 Personal history of nicotine dependence: Secondary | ICD-10-CM | POA: Diagnosis not present

## 2018-04-09 DIAGNOSIS — Z79899 Other long term (current) drug therapy: Secondary | ICD-10-CM | POA: Diagnosis not present

## 2018-04-09 DIAGNOSIS — Z7982 Long term (current) use of aspirin: Secondary | ICD-10-CM | POA: Insufficient documentation

## 2018-04-09 DIAGNOSIS — M6281 Muscle weakness (generalized): Secondary | ICD-10-CM | POA: Diagnosis not present

## 2018-04-09 DIAGNOSIS — R2689 Other abnormalities of gait and mobility: Secondary | ICD-10-CM | POA: Insufficient documentation

## 2018-04-09 DIAGNOSIS — Z791 Long term (current) use of non-steroidal anti-inflammatories (NSAID): Secondary | ICD-10-CM | POA: Diagnosis not present

## 2018-04-09 DIAGNOSIS — Z8249 Family history of ischemic heart disease and other diseases of the circulatory system: Secondary | ICD-10-CM | POA: Insufficient documentation

## 2018-04-09 DIAGNOSIS — I10 Essential (primary) hypertension: Secondary | ICD-10-CM | POA: Diagnosis not present

## 2018-04-09 DIAGNOSIS — E7800 Pure hypercholesterolemia, unspecified: Secondary | ICD-10-CM | POA: Diagnosis not present

## 2018-04-09 DIAGNOSIS — Z96651 Presence of right artificial knee joint: Secondary | ICD-10-CM

## 2018-04-09 HISTORY — PX: TOTAL KNEE ARTHROPLASTY: SHX125

## 2018-04-09 SURGERY — ARTHROPLASTY, KNEE, TOTAL
Anesthesia: General | Site: Knee | Laterality: Right

## 2018-04-09 MED ORDER — LISINOPRIL-HYDROCHLOROTHIAZIDE 20-25 MG PO TABS: 1.0000 | ORAL_TABLET | Freq: Every day | ORAL | Status: DC

## 2018-04-09 MED ORDER — ASPIRIN EC 81 MG PO TBEC
81.0000 mg | DELAYED_RELEASE_TABLET | Freq: Every day | ORAL | Status: DC
  Administered 2018-04-09 – 2018-04-11 (×3): 81 mg via ORAL
  Filled 2018-04-09 (×3): qty 1

## 2018-04-09 MED ORDER — SUGAMMADEX SODIUM 200 MG/2ML IV SOLN
INTRAVENOUS | Status: AC
  Filled 2018-04-09: qty 2

## 2018-04-09 MED ORDER — 0.9 % SODIUM CHLORIDE (POUR BTL) OPTIME
TOPICAL | Status: DC | PRN
  Administered 2018-04-09: 1000 mL

## 2018-04-09 MED ORDER — DEXAMETHASONE SODIUM PHOSPHATE 10 MG/ML IJ SOLN
10.0000 mg | Freq: Once | INTRAMUSCULAR | Status: AC
  Administered 2018-04-10: 10 mg via INTRAVENOUS
  Filled 2018-04-09 (×2): qty 1

## 2018-04-09 MED ORDER — SODIUM CHLORIDE (PF) 0.9 % IJ SOLN
INTRAMUSCULAR | Status: AC
  Filled 2018-04-09: qty 40

## 2018-04-09 MED ORDER — HYDROCODONE-ACETAMINOPHEN 7.5-325 MG PO TABS: 1.0000 | ORAL_TABLET | Freq: Once | ORAL | Status: DC

## 2018-04-09 MED ORDER — DIPHENHYDRAMINE HCL 12.5 MG/5ML PO ELIX: 12.5000 mg | ORAL_SOLUTION | ORAL | Status: DC | PRN

## 2018-04-09 MED ORDER — ONDANSETRON HCL 4 MG/2ML IJ SOLN
INTRAMUSCULAR | Status: AC
  Filled 2018-04-09: qty 2

## 2018-04-09 MED ORDER — CEFAZOLIN SODIUM-DEXTROSE 2-4 GM/100ML-% IV SOLN
2.0000 g | INTRAVENOUS | Status: AC
  Administered 2018-04-09: 2 g via INTRAVENOUS

## 2018-04-09 MED ORDER — PREGABALIN 50 MG PO CAPS
ORAL_CAPSULE | ORAL | Status: AC
  Filled 2018-04-09: qty 1

## 2018-04-09 MED ORDER — FENTANYL CITRATE (PF) 100 MCG/2ML IJ SOLN
INTRAMUSCULAR | Status: DC | PRN
  Administered 2018-04-09 (×7): 50 ug via INTRAVENOUS

## 2018-04-09 MED ORDER — TRANEXAMIC ACID-NACL 1000-0.7 MG/100ML-% IV SOLN
INTRAVENOUS | Status: AC
  Filled 2018-04-09: qty 100

## 2018-04-09 MED ORDER — SODIUM CHLORIDE 0.9 % IR SOLN
Status: DC | PRN
  Administered 2018-04-09: 3000 mL

## 2018-04-09 MED ORDER — METOCLOPRAMIDE HCL 5 MG/ML IJ SOLN: 5.0000 mg | Freq: Three times a day (TID) | INTRAMUSCULAR | Status: DC | PRN

## 2018-04-09 MED ORDER — ALUM & MAG HYDROXIDE-SIMETH 200-200-20 MG/5ML PO SUSP: 30.0000 mL | ORAL | Status: DC | PRN

## 2018-04-09 MED ORDER — METHOCARBAMOL 1000 MG/10ML IJ SOLN: 500.0000 mg | Freq: Four times a day (QID) | INTRAVENOUS | Status: DC | PRN

## 2018-04-09 MED ORDER — MORPHINE SULFATE (PF) 2 MG/ML IV SOLN
0.5000 mg | INTRAVENOUS | Status: DC | PRN
  Administered 2018-04-09: 1 mg via INTRAVENOUS
  Filled 2018-04-09: qty 1

## 2018-04-09 MED ORDER — ACETAMINOPHEN 500 MG PO TABS
500.0000 mg | ORAL_TABLET | Freq: Four times a day (QID) | ORAL | Status: AC
  Administered 2018-04-09 – 2018-04-10 (×3): 500 mg via ORAL
  Filled 2018-04-09 (×4): qty 1

## 2018-04-09 MED ORDER — TRANEXAMIC ACID-NACL 1000-0.7 MG/100ML-% IV SOLN
1000.0000 mg | Freq: Once | INTRAVENOUS | Status: DC
  Filled 2018-04-09: qty 100

## 2018-04-09 MED ORDER — TRANEXAMIC ACID-NACL 1000-0.7 MG/100ML-% IV SOLN
1000.0000 mg | INTRAVENOUS | Status: AC
  Administered 2018-04-09: 1000 mg via INTRAVENOUS

## 2018-04-09 MED ORDER — CELECOXIB 100 MG PO CAPS
200.0000 mg | ORAL_CAPSULE | Freq: Two times a day (BID) | ORAL | Status: DC
  Administered 2018-04-09 – 2018-04-11 (×5): 200 mg via ORAL
  Filled 2018-04-09 (×5): qty 2

## 2018-04-09 MED ORDER — FENTANYL CITRATE (PF) 250 MCG/5ML IJ SOLN
INTRAMUSCULAR | Status: AC
  Filled 2018-04-09: qty 5

## 2018-04-09 MED ORDER — SODIUM CHLORIDE 0.9 % IV SOLN
INTRAVENOUS | Status: AC
  Administered 2018-04-09: 13:00:00 via INTRAVENOUS

## 2018-04-09 MED ORDER — ACETAMINOPHEN 500 MG PO TABS
500.0000 mg | ORAL_TABLET | Freq: Once | ORAL | Status: AC
  Administered 2018-04-09: 500 mg via ORAL

## 2018-04-09 MED ORDER — CHLORHEXIDINE GLUCONATE 4 % EX LIQD: 60.0000 mL | Freq: Once | CUTANEOUS | Status: DC

## 2018-04-09 MED ORDER — PHENOL 1.4 % MT LIQD: 1.0000 | OROMUCOSAL | Status: DC | PRN

## 2018-04-09 MED ORDER — PREGABALIN 50 MG PO CAPS
50.0000 mg | ORAL_CAPSULE | Freq: Once | ORAL | Status: AC
  Administered 2018-04-09: 50 mg via ORAL

## 2018-04-09 MED ORDER — LACTATED RINGERS IV SOLN
INTRAVENOUS | Status: DC
  Administered 2018-04-09: 1000 mL via INTRAVENOUS

## 2018-04-09 MED ORDER — MENTHOL 3 MG MT LOZG: 1.0000 | LOZENGE | OROMUCOSAL | Status: DC | PRN

## 2018-04-09 MED ORDER — ONDANSETRON HCL 4 MG PO TABS: 4.0000 mg | ORAL_TABLET | Freq: Four times a day (QID) | ORAL | Status: DC | PRN

## 2018-04-09 MED ORDER — ONDANSETRON HCL 4 MG/2ML IJ SOLN
4.0000 mg | Freq: Four times a day (QID) | INTRAMUSCULAR | Status: DC | PRN
  Administered 2018-04-09 – 2018-04-10 (×3): 4 mg via INTRAVENOUS
  Filled 2018-04-09 (×3): qty 2

## 2018-04-09 MED ORDER — ADULT MULTIVITAMIN W/MINERALS CH
1.0000 | ORAL_TABLET | Freq: Every day | ORAL | Status: DC
  Administered 2018-04-10 – 2018-04-11 (×2): 1 via ORAL
  Filled 2018-04-09 (×3): qty 1

## 2018-04-09 MED ORDER — PANTOPRAZOLE SODIUM 40 MG PO TBEC
40.0000 mg | DELAYED_RELEASE_TABLET | Freq: Every day | ORAL | Status: DC
  Administered 2018-04-09 – 2018-04-11 (×3): 40 mg via ORAL
  Filled 2018-04-09 (×3): qty 1

## 2018-04-09 MED ORDER — CELECOXIB 400 MG PO CAPS
400.0000 mg | ORAL_CAPSULE | Freq: Once | ORAL | Status: AC
  Administered 2018-04-09: 400 mg via ORAL

## 2018-04-09 MED ORDER — DOCUSATE SODIUM 100 MG PO CAPS
100.0000 mg | ORAL_CAPSULE | Freq: Two times a day (BID) | ORAL | Status: DC
  Administered 2018-04-09 – 2018-04-11 (×5): 100 mg via ORAL
  Filled 2018-04-09 (×5): qty 1

## 2018-04-09 MED ORDER — PROPOFOL 10 MG/ML IV BOLUS
INTRAVENOUS | Status: DC | PRN
  Administered 2018-04-09: 150 mg via INTRAVENOUS

## 2018-04-09 MED ORDER — MIDAZOLAM HCL 2 MG/2ML IJ SOLN: 0.5000 mg | Freq: Once | INTRAMUSCULAR | Status: DC | PRN

## 2018-04-09 MED ORDER — PROMETHAZINE HCL 25 MG/ML IJ SOLN: 6.2500 mg | INTRAMUSCULAR | Status: DC | PRN

## 2018-04-09 MED ORDER — BUPIVACAINE-EPINEPHRINE (PF) 0.5% -1:200000 IJ SOLN
INTRAMUSCULAR | Status: DC | PRN
  Administered 2018-04-09: 20 mL via PERINEURAL

## 2018-04-09 MED ORDER — CALCIUM CARBONATE 1250 (500 CA) MG PO TABS
1.0000 | ORAL_TABLET | Freq: Two times a day (BID) | ORAL | Status: DC
  Administered 2018-04-09 – 2018-04-11 (×4): 500 mg via ORAL
  Filled 2018-04-09 (×4): qty 1

## 2018-04-09 MED ORDER — BUPIVACAINE-EPINEPHRINE (PF) 0.5% -1:200000 IJ SOLN
INTRAMUSCULAR | Status: AC
  Filled 2018-04-09: qty 30

## 2018-04-09 MED ORDER — MIDAZOLAM HCL 5 MG/5ML IJ SOLN
INTRAMUSCULAR | Status: DC | PRN
  Administered 2018-04-09: 2 mg via INTRAVENOUS

## 2018-04-09 MED ORDER — SIMVASTATIN 20 MG PO TABS
20.0000 mg | ORAL_TABLET | Freq: Every day | ORAL | Status: DC
  Administered 2018-04-09 – 2018-04-10 (×2): 20 mg via ORAL
  Filled 2018-04-09 (×2): qty 1

## 2018-04-09 MED ORDER — METHOCARBAMOL 1000 MG/10ML IJ SOLN
500.0000 mg | Freq: Once | INTRAVENOUS | Status: AC
  Administered 2018-04-09: 500 mg via INTRAVENOUS
  Filled 2018-04-09: qty 5

## 2018-04-09 MED ORDER — ROCURONIUM 10MG/ML (10ML) SYRINGE FOR MEDFUSION PUMP - OPTIME
INTRAVENOUS | Status: DC | PRN
  Administered 2018-04-09: 40 mg via INTRAVENOUS

## 2018-04-09 MED ORDER — HYDROMORPHONE HCL 1 MG/ML IJ SOLN
INTRAMUSCULAR | Status: AC
  Filled 2018-04-09: qty 0.5

## 2018-04-09 MED ORDER — METOCLOPRAMIDE HCL 10 MG PO TABS: 5.0000 mg | ORAL_TABLET | Freq: Three times a day (TID) | ORAL | Status: DC | PRN

## 2018-04-09 MED ORDER — SODIUM CHLORIDE 0.9 % IV SOLN
INTRAVENOUS | Status: DC | PRN
  Administered 2018-04-09: 60 mL

## 2018-04-09 MED ORDER — ONDANSETRON HCL 4 MG/2ML IJ SOLN
INTRAMUSCULAR | Status: DC | PRN
  Administered 2018-04-09: 4 mg via INTRAVENOUS

## 2018-04-09 MED ORDER — ONDANSETRON HCL 4 MG/2ML IJ SOLN
4.0000 mg | Freq: Once | INTRAMUSCULAR | Status: AC
  Administered 2018-04-09: 4 mg via INTRAVENOUS

## 2018-04-09 MED ORDER — BUPIVACAINE LIPOSOME 1.3 % IJ SUSP
INTRAMUSCULAR | Status: AC
  Filled 2018-04-09: qty 20

## 2018-04-09 MED ORDER — ROCURONIUM BROMIDE 10 MG/ML (PF) SYRINGE
PREFILLED_SYRINGE | INTRAVENOUS | Status: AC
  Filled 2018-04-09: qty 10

## 2018-04-09 MED ORDER — HYDROCODONE-ACETAMINOPHEN 7.5-325 MG PO TABS
1.0000 | ORAL_TABLET | Freq: Once | ORAL | Status: AC | PRN
  Administered 2018-04-09: 1 via ORAL

## 2018-04-09 MED ORDER — FENTANYL CITRATE (PF) 100 MCG/2ML IJ SOLN
INTRAMUSCULAR | Status: AC
  Filled 2018-04-09: qty 2

## 2018-04-09 MED ORDER — TRAMADOL HCL 50 MG PO TABS
50.0000 mg | ORAL_TABLET | Freq: Four times a day (QID) | ORAL | Status: DC
  Administered 2018-04-09 – 2018-04-11 (×9): 50 mg via ORAL
  Filled 2018-04-09 (×8): qty 1

## 2018-04-09 MED ORDER — PROPOFOL 10 MG/ML IV BOLUS
INTRAVENOUS | Status: AC
  Filled 2018-04-09: qty 40

## 2018-04-09 MED ORDER — HYDROCODONE-ACETAMINOPHEN 7.5-325 MG PO TABS
1.0000 | ORAL_TABLET | ORAL | Status: DC | PRN
  Administered 2018-04-09 – 2018-04-10 (×3): 1 via ORAL
  Administered 2018-04-10: 2 via ORAL
  Filled 2018-04-09: qty 2
  Filled 2018-04-09 (×3): qty 1

## 2018-04-09 MED ORDER — ACETAMINOPHEN 500 MG PO TABS
ORAL_TABLET | ORAL | Status: AC
  Filled 2018-04-09: qty 1

## 2018-04-09 MED ORDER — METHOCARBAMOL 500 MG PO TABS
500.0000 mg | ORAL_TABLET | Freq: Four times a day (QID) | ORAL | Status: DC | PRN
  Administered 2018-04-09: 500 mg via ORAL
  Filled 2018-04-09: qty 1

## 2018-04-09 MED ORDER — HYDROMORPHONE HCL 1 MG/ML IJ SOLN
0.2500 mg | INTRAMUSCULAR | Status: DC | PRN
  Administered 2018-04-09 (×3): 0.5 mg via INTRAVENOUS
  Filled 2018-04-09 (×2): qty 0.5

## 2018-04-09 MED ORDER — LISINOPRIL 10 MG PO TABS
20.0000 mg | ORAL_TABLET | Freq: Every day | ORAL | Status: DC
  Administered 2018-04-10 – 2018-04-11 (×2): 20 mg via ORAL
  Filled 2018-04-09 (×3): qty 2

## 2018-04-09 MED ORDER — CELECOXIB 400 MG PO CAPS
ORAL_CAPSULE | ORAL | Status: AC
  Filled 2018-04-09: qty 1

## 2018-04-09 MED ORDER — MIDAZOLAM HCL 2 MG/2ML IJ SOLN
INTRAMUSCULAR | Status: AC
  Filled 2018-04-09: qty 2

## 2018-04-09 MED ORDER — HYDROCODONE-ACETAMINOPHEN 7.5-325 MG PO TABS
ORAL_TABLET | ORAL | Status: AC
  Filled 2018-04-09: qty 1

## 2018-04-09 MED ORDER — CEFAZOLIN SODIUM-DEXTROSE 2-4 GM/100ML-% IV SOLN
INTRAVENOUS | Status: AC
  Filled 2018-04-09: qty 100

## 2018-04-09 MED ORDER — CEFAZOLIN SODIUM-DEXTROSE 2-4 GM/100ML-% IV SOLN
2.0000 g | Freq: Four times a day (QID) | INTRAVENOUS | Status: AC
  Administered 2018-04-09 (×2): 2 g via INTRAVENOUS
  Filled 2018-04-09 (×3): qty 100

## 2018-04-09 MED ORDER — HYDROCHLOROTHIAZIDE 25 MG PO TABS
25.0000 mg | ORAL_TABLET | Freq: Every day | ORAL | Status: DC
  Administered 2018-04-10 – 2018-04-11 (×2): 25 mg via ORAL
  Filled 2018-04-09 (×3): qty 1

## 2018-04-09 SURGICAL SUPPLY — 69 items
BANDAGE ELASTIC 4 LF NS (GAUZE/BANDAGES/DRESSINGS) ×6 IMPLANT
BANDAGE ELASTIC 6 LF NS (GAUZE/BANDAGES/DRESSINGS) ×3 IMPLANT
BANDAGE ESMARK 6X9 LF (GAUZE/BANDAGES/DRESSINGS) ×1 IMPLANT
BIT DRILL 3.2X128 (BIT) IMPLANT
BIT DRILL 3.2X128MM (BIT)
BLADE HEX COATED 2.75 (ELECTRODE) ×3 IMPLANT
BLADE SAGITTAL 25.0X1.27X90 (BLADE) ×2 IMPLANT
BLADE SAGITTAL 25.0X1.27X90MM (BLADE) ×1
BLADE SAW SAG 90X13X1.27 (BLADE) IMPLANT
BNDG ESMARK 6X9 LF (GAUZE/BANDAGES/DRESSINGS) ×3
CEMENT HV SMART SET (Cement) ×6 IMPLANT
CLOTH BEACON ORANGE TIMEOUT ST (SAFETY) ×3 IMPLANT
COOLER CRYO CUFF IC AND MOTOR (MISCELLANEOUS) ×3 IMPLANT
COVER LIGHT HANDLE STERIS (MISCELLANEOUS) ×6 IMPLANT
COVER WAND RF STERILE (DRAPES) ×3 IMPLANT
CUFF CRYO KNEE18X23 MED (MISCELLANEOUS) ×3 IMPLANT
CUFF TOURNIQUET SINGLE 34IN LL (TOURNIQUET CUFF) ×3 IMPLANT
DECANTER SPIKE VIAL GLASS SM (MISCELLANEOUS) ×3 IMPLANT
DRAPE BACK TABLE (DRAPES) ×3 IMPLANT
DRAPE EXTREMITY T 121X128X90 (DISPOSABLE) ×3 IMPLANT
DRSG MEPILEX BORDER 4X12 (GAUZE/BANDAGES/DRESSINGS) ×3 IMPLANT
ELECT REM PT RETURN 9FT ADLT (ELECTROSURGICAL) ×3
ELECTRODE REM PT RTRN 9FT ADLT (ELECTROSURGICAL) ×1 IMPLANT
FEMUR RIGHT SZ 4 (Knees) ×3 IMPLANT
GLOVE BIO SURGEON STRL SZ7 (GLOVE) ×6 IMPLANT
GLOVE BIOGEL PI IND STRL 7.0 (GLOVE) ×4 IMPLANT
GLOVE BIOGEL PI INDICATOR 7.0 (GLOVE) ×8
GLOVE SKINSENSE NS SZ8.0 LF (GLOVE) ×4
GLOVE SKINSENSE STRL SZ8.0 LF (GLOVE) ×2 IMPLANT
GLOVE SS N UNI LF 8.5 STRL (GLOVE) ×3 IMPLANT
GOWN STRL REUS W/ TWL LRG LVL3 (GOWN DISPOSABLE) ×1 IMPLANT
GOWN STRL REUS W/TWL LRG LVL3 (GOWN DISPOSABLE) ×5 IMPLANT
GOWN STRL REUS W/TWL XL LVL3 (GOWN DISPOSABLE) ×3 IMPLANT
HANDPIECE INTERPULSE COAX TIP (DISPOSABLE) ×2
HOOD W/PEELAWAY (MISCELLANEOUS) ×9 IMPLANT
INSERT STABILIZED 10MM (Knees) ×3 IMPLANT
INST SET MAJOR BONE (KITS) ×3 IMPLANT
IV NS IRRIG 3000ML ARTHROMATIC (IV SOLUTION) ×3 IMPLANT
KIT BLADEGUARD II DBL (SET/KITS/TRAYS/PACK) ×3 IMPLANT
KIT TURNOVER CYSTO (KITS) ×3 IMPLANT
MANIFOLD NEPTUNE II (INSTRUMENTS) ×3 IMPLANT
MARKER SKIN DUAL TIP RULER LAB (MISCELLANEOUS) ×3 IMPLANT
NEEDLE HYPO 21X1.5 SAFETY (NEEDLE) ×3 IMPLANT
NS IRRIG 1000ML POUR BTL (IV SOLUTION) ×3 IMPLANT
PACK TOTAL JOINT (CUSTOM PROCEDURE TRAY) ×3 IMPLANT
PAD ARMBOARD 7.5X6 YLW CONV (MISCELLANEOUS) ×3 IMPLANT
PAD DANNIFLEX CPM (ORTHOPEDIC SUPPLIES) ×3 IMPLANT
PATELLA DOME PFC 35MM (Knees) ×3 IMPLANT
PILLOW KNEE EXTENSION 0 DEG (MISCELLANEOUS) ×3 IMPLANT
PIN THREADED HEADED SIGMA (PIN) ×3 IMPLANT
PIN TROCAR 3 INCH (PIN) IMPLANT
PIN/DRILL PACK ORTHO 1/8X3.0 (PIN) ×3 IMPLANT
SAW OSC TIP CART 19.5X105X1.3 (SAW) ×3 IMPLANT
SET BASIN LINEN APH (SET/KITS/TRAYS/PACK) ×3 IMPLANT
SET HNDPC FAN SPRY TIP SCT (DISPOSABLE) ×1 IMPLANT
STAPLER VISISTAT 35W (STAPLE) ×3 IMPLANT
SUT BRALON NAB BRD #1 30IN (SUTURE) ×6 IMPLANT
SUT MNCRL 0 VIOLET CTX 36 (SUTURE) ×2 IMPLANT
SUT MON AB 0 CT1 (SUTURE) IMPLANT
SUT MON AB 2-0 CT1 36 (SUTURE) IMPLANT
SUT MONOCRYL 0 CTX 36 (SUTURE) ×4
SYR BULB IRRIGATION 50ML (SYRINGE) ×6 IMPLANT
SYRINGE 20CC LL (MISCELLANEOUS) ×15 IMPLANT
TOWEL OR 17X26 4PK STRL BLUE (TOWEL DISPOSABLE) ×3 IMPLANT
TOWER CARTRIDGE SMART MIX (DISPOSABLE) ×3 IMPLANT
TRAY FOLEY MTR SLVR 16FR STAT (SET/KITS/TRAYS/PACK) ×3 IMPLANT
TRAY TIBIAL SZ3 COBALT 71X47MM (Knees) ×3 IMPLANT
WATER STERILE IRR 1000ML POUR (IV SOLUTION) ×6 IMPLANT
YANKAUER SUCT 12FT TUBE ARGYLE (SUCTIONS) ×3 IMPLANT

## 2018-04-09 NOTE — Evaluation (Signed)
Physical Therapy Evaluation Patient Details Name: KAYDYN CHISM MRN: [id number] DOB: Sep 17, 1950 Today's Date: 04/09/2018  RIGHT KNEE ROM: 0-85 degrees AMBULATION DISTANCE: 50 feet using RW with Min Guard assist    History of Present Illness  DLISA BARNWELL is a 68 y/o female, s/p Right TKA, 04/09/18, with the diagnosis of right knee osteoarthritis.  Clinical Impression  Patient instructed in post op bed exercises and given written instructions with understanding acknowledged, demonstrates slow labored movement for sitting up at bedside, good return for using LLE to self flex/stretching right knee into flexion, slow slightly labored cadence with limited RLE heel strike due to increased pain and put back to bed with RLE in bone foam.  RN notified for pain medication.  Patient will benefit from continued physical therapy in hospital and recommended venue below to increase strength, balance, endurance for safe ADLs and gait.    Follow Up Recommendations Home health PT;Supervision - Intermittent    Equipment Recommendations  None recommended by PT    Recommendations for Other Services       Precautions / Restrictions Precautions Precautions: Fall Restrictions Weight Bearing Restrictions: Yes RLE Weight Bearing: Weight bearing as tolerated      Mobility  Bed Mobility Overal bed mobility: Needs Assistance Bed Mobility: Supine to Sit;Sit to Supine     Supine to sit: Min assist Sit to supine: Min guard   General bed mobility comments: required assistance to move RLE during supine to sitting  Transfers Overall transfer level: Needs assistance Equipment used: Rolling walker (2 wheeled) Transfers: Sit to/from [facility] Sit to Stand: Min guard Stand pivot transfers: Min guard       General transfer comment: labored movement  Ambulation/Gait Ambulation/Gait assistance: Min guard Gait Distance (Feet): 50 Feet Assistive device: Rolling walker (2 wheeled) Gait  Pattern/deviations: Decreased step length - right;Decreased stance time - right;Decreased stride length Gait velocity: decreased   General Gait Details: slow labored cadence with fair return for right heel to toe stepping, no loss of balance, limited secondary to right knee pain and fatigue  Stairs            Wheelchair Mobility    Modified Rankin (Stroke Patients Only)       Balance Overall balance assessment: Needs assistance Sitting-balance support: Feet supported;No upper extremity supported Sitting balance-Leahy Scale: Fair     Standing balance support: Bilateral upper extremity supported;During functional activity Standing balance-Leahy Scale: Fair Standing balance comment: using RW                             Pertinent Vitals/Pain Pain Assessment: 0-10 Pain Score: 9  Pain Location: right knee Pain Descriptors / Indicators: Sore;Grimacing Pain Intervention(s): Limited activity within patient's tolerance;Monitored during session;Patient requesting pain meds-RN notified    Home Living Family/patient expects to be discharged to:: Private residence Living Arrangements: Children Available Help at Discharge: Family Type of Home: House Home Access: Stairs to enter Entrance Stairs-Rails: None Entrance Stairs-Number of Steps: 4 Home Layout: Multi-level;Laundry or work area in [place]: Environmental consultant - 2 wheels      Prior Function Level of Independence: Independent         Comments: community ambulator, drives     Journalist, newspaper        Extremity/Trunk Assessment   Upper Extremity Assessment Upper Extremity Assessment: Overall WFL for tasks assessed    Lower Extremity Assessment Lower Extremity Assessment: Generalized weakness;RLE deficits/detail;LLE deficits/detail RLE  Deficits / Details: grossly 3+/5 LLE Deficits / Details: grossly 5/5    Cervical / Trunk Assessment Cervical / Trunk Assessment: Normal  Communication    Communication: No difficulties  Cognition Arousal/Alertness: Awake/alert Behavior During Therapy: WFL for tasks assessed/performed Overall Cognitive Status: Within Functional Limits for tasks assessed                                        General Comments      Exercises Total Joint Exercises Ankle Circles/Pumps: Supine;AROM;Strengthening;Both;5 reps Quad Sets: Supine;AROM;Strengthening;Right;5 reps Short Arc Quad: Supine;AROM;Strengthening;Right;5 reps Heel Slides: Supine;AROM;Strengthening;Right;5 reps Goniometric ROM: Right knee: 0-85 degrees   Assessment/Plan    PT Assessment Patient needs continued PT services  PT Problem List Decreased range of motion;Decreased strength;Decreased activity tolerance;Decreased balance;Pain;Decreased mobility       PT Treatment Interventions Gait training;Stair training;Functional mobility training;Therapeutic activities;Patient/family education;Therapeutic exercise    PT Goals (Current goals can be found in the Care Plan section)  Acute Rehab PT Goals Patient Stated Goal: return home with family to assist PT Goal Formulation: With patient Time For Goal Achievement: 04/13/18 Potential to Achieve Goals: Good    Frequency 7X/week   Barriers to discharge        Co-evaluation               AM-PAC PT "6 Clicks" Mobility  Outcome Measure Help needed turning from your back to your side while in a flat bed without using bedrails?: A Little Help needed moving from lying on your back to sitting on the side of a flat bed without using bedrails?: A Little Help needed moving to and from a bed to a chair (including a wheelchair)?: A Little Help needed standing up from a chair using your arms (e.g., wheelchair or bedside chair)?: A Little Help needed to walk in hospital room?: A Little Help needed climbing 3-5 steps with a railing? : A Lot 6 Click Score: 17    End of Session   Activity Tolerance: Patient tolerated  treatment well;Patient limited by fatigue Patient left: in bed;with call bell/phone within reach;with bed alarm set;with family/visitor present Nurse Communication: Mobility status PT Visit Diagnosis: Unsteadiness on feet (R26.81);Other abnormalities of gait and mobility (R26.89);Muscle weakness (generalized) (M62.81)    Time: 1093-2355 PT Time Calculation (min) (ACUTE ONLY): 36 min   Charges:   PT Evaluation $PT Eval Moderate Complexity: 1 Mod PT Treatments $Therapeutic Activity: 23-37 mins        4:21 PM, 04/09/18 Lonell Grandchild, MPT Physical Therapist with [facility] [phone] office [phone] mobile phone

## 2018-04-09 NOTE — Anesthesia Preprocedure Evaluation (Signed)
Anesthesia Evaluation  Patient identified by MRN, date of birth, ID band Patient awake    Reviewed: Allergy & Precautions, NPO status , Patient's Chart, lab work & pertinent test results  Airway Mallampati: II  TM Distance: >3 FB Neck ROM: Full    Dental no notable dental hx. (+) Edentulous Upper, Edentulous Lower   Pulmonary neg pulmonary ROS, former smoker,    Pulmonary exam normal breath sounds clear to auscultation       Cardiovascular Exercise Tolerance: Good hypertension, Pt. on medications negative cardio ROS Normal cardiovascular examI Rhythm:Regular Rate:Normal     Neuro/Psych negative neurological ROS  negative psych ROS   GI/Hepatic Neg liver ROS, GERD  Medicated and Controlled,  Endo/Other  negative endocrine ROS  Renal/GU Renal InsufficiencyRenal disease  negative genitourinary   Musculoskeletal  (+) Arthritis , Osteoarthritis,    Abdominal   Peds negative pediatric ROS (+)  Hematology negative hematology ROS (+)   Anesthesia Other Findings   Reproductive/Obstetrics negative OB ROS                             Anesthesia Physical Anesthesia Plan  ASA: II  Anesthesia Plan: General   Post-op Pain Management:    Induction: Intravenous  PONV Risk Score and Plan:   Airway Management Planned: Oral ETT  Additional Equipment:   Intra-op Plan:   Post-operative Plan: Extubation in OR  Informed Consent: I have reviewed the patients History and Physical, chart, labs and discussed the procedure including the risks, benefits and alternatives for the proposed anesthesia with the patient or authorized representative who has indicated his/her understanding and acceptance.     Dental advisory given  Plan Discussed with: CRNA  Anesthesia Plan Comments:         Anesthesia Quick Evaluation

## 2018-04-09 NOTE — Transfer of Care (Signed)
Immediate Anesthesia Transfer of Care Note  Patient: Robin Villa  Procedure(s) Performed: TOTAL KNEE ARTHROPLASTY (Right Knee)  Patient Location: PACU  Anesthesia Type:General  Level of Consciousness: awake  Airway & Oxygen Therapy: Patient Spontanous Breathing  Post-op Assessment: Report given to RN and Post -op Vital signs reviewed and stable  Post vital signs: Reviewed and stable  Last Vitals:  Vitals Value Taken Time  BP 136/63 04/09/2018 10:00 AM  Temp 36.4 C 04/09/2018  9:57 AM  Pulse 77 04/09/2018 10:01 AM  Resp 15 04/09/2018 10:01 AM  SpO2 97 % 04/09/2018 10:01 AM  Vitals shown include unvalidated device data.  Last Pain:  Vitals:   04/09/18 0646  TempSrc: Oral  PainSc: 0-No pain      Patients Stated Pain Goal: 8 (16/10/96 0454)  Complications: No apparent anesthesia complications

## 2018-04-09 NOTE — Plan of Care (Signed)
  Problem: Acute Rehab PT Goals(only PT should resolve) Goal: Pt Will Go Supine/Side To Sit Outcome: Progressing Flowsheets (Taken 04/09/2018 1622) Pt will go Supine/Side to Sit: with modified independence Goal: Patient Will Transfer Sit To/From Stand Outcome: Progressing Flowsheets (Taken 04/09/2018 1622) Patient will transfer sit to/from stand: with modified independence Goal: Pt Will Transfer Bed To Chair/Chair To Bed Outcome: Progressing Flowsheets (Taken 04/09/2018 1622) Pt will Transfer Bed to Chair/Chair to Bed: with modified independence Goal: Pt Will Ambulate Outcome: Progressing Flowsheets (Taken 04/09/2018 1622) Pt will Ambulate: > 125 feet; with supervision; with rolling walker   4:23 PM, 04/09/18 Lonell Grandchild, MPT Physical Therapist with [facility] 336 [phone] office [phone] mobile phone

## 2018-04-09 NOTE — Anesthesia Postprocedure Evaluation (Signed)
Anesthesia Post Note Late Entry for 1100  Patient: Robin Villa  Procedure(s) Performed: TOTAL KNEE ARTHROPLASTY (Right Knee)  Patient location during evaluation: PACU Anesthesia Type: General Level of consciousness: awake and alert and oriented Pain management: pain level controlled Vital Signs Assessment: post-procedure vital signs reviewed and stable Respiratory status: spontaneous breathing Cardiovascular status: stable Postop Assessment: no apparent nausea or vomiting Anesthetic complications: no     Last Vitals:  Vitals:   04/09/18 1045 04/09/18 1100  BP: (!) 114/58 (!) 117/51  Pulse: 76 (!) 54  Resp: 12 (!) 8  Temp:    SpO2: 97% 97%    Last Pain:  Vitals:   04/09/18 1100  TempSrc:   PainSc: Asleep                 [name]

## 2018-04-09 NOTE — Anesthesia Procedure Notes (Signed)
Procedure Name: Intubation Date/Time: 04/09/2018 7:41 AM Performed by: Ollen Bowl, CRNA Pre-anesthesia Checklist: Patient identified, Patient being monitored, Timeout performed, Emergency Drugs available and Suction available Patient Re-evaluated:Patient Re-evaluated prior to induction Oxygen Delivery Method: Circle system utilized Preoxygenation: Pre-oxygenation with 100% oxygen Induction Type: IV induction Ventilation: Mask ventilation without difficulty Laryngoscope Size: Mac and 3 Grade View: Grade I Tube type: Oral Tube size: 7.0 mm Number of attempts: 1 Airway Equipment and Method: Stylet Placement Confirmation: ETT inserted through vocal cords under direct vision,  positive ETCO2 and breath sounds checked- equal and bilateral Secured at: 21 cm Tube secured with: Tape Dental Injury: Teeth and Oropharynx as per pre-operative assessment

## 2018-04-09 NOTE — Interval H&P Note (Signed)
History and Physical Interval Note:  04/09/2018 7:16 AM  Robin Villa  has presented today for surgery, with the diagnosis of right knee osteoarthritis.  The various methods of treatment have been discussed with the patient and family. After consideration of risks, benefits and other options for treatment, the patient has consented to  Procedure(s): TOTAL KNEE ARTHROPLASTY (Right) as a surgical intervention.  The patient's history has been reviewed, patient examined, no change in status, stable for surgery.  I have reviewed the patient's chart and labs.  Questions were answered to the patient's satisfaction.     Arther Abbott

## 2018-04-09 NOTE — Brief Op Note (Signed)
04/09/2018  9:53 AM  PATIENT:  Robin Villa  68 y.o. female  PRE-OPERATIVE DIAGNOSIS:  right knee osteoarthritis  POST-OPERATIVE DIAGNOSIS:  right knee osteoarthritis  PROCEDURE:  Procedure(s): TOTAL KNEE ARTHROPLASTY (Right)   DEPUY SIGMA FB PS 64F 3 T 10POLY AND 38 X 8.5 P   SURGEON:  Surgeon(s) and Role:    * Carole Civil, MD - Primary  PHYSICIAN ASSISTANT:   ASSISTANTS: BETTY ASHLEY   ANESTHESIA:   general  EBL:  25 mL   BLOOD ADMINISTERED:none  DRAINS: none   LOCAL MEDICATIONS USED:  MARCAINE   , Amount: 30CC ml and OTHER EXPAREL 20 CC DILUTED WITH 40 CC SALINE   SPECIMEN:  No Specimen  DISPOSITION OF SPECIMEN:  N/A  COUNTS:  YES  TOURNIQUET:   Total Tourniquet Time Documented: Thigh (Right) - 102 minutes Total: Thigh (Right) - 102 minutes   DICTATION: .Viviann Spare Dictation  PLAN OF CARE: Admit for overnight observation  PATIENT DISPOSITION:  PACU - hemodynamically stable.   Delay start of Pharmacological VTE agent (>24hrs) due to surgical blood loss or risk of bleeding: no

## 2018-04-09 NOTE — Op Note (Signed)
Today's date 04/09/2018  Date of surgery 04/09/2018   Operative report for a right total knee arthroplasty  Preop diagnosis primary osteoarthritis right knee Postop diagnosis same Procedure right total knee arthroplasty-[id number] Implants Depuy fixed-bearing posterior stabilized Sigma prosthesis  Surgeon Aline Brochure [phone]   Details of procedure  Assisted by Simonne Maffucci  Anesthetic General anesthesia  Implants DEPUY  SIGMA PS FB   SIZES:    F 4   T 3   P 35 x 8 Poly 10  Drains: None   Exparel 20MG  DILUTED W/ 40 CC SALINE   Marcaine with epinephrine    Operative findings : Severe arthritis of the lateral compartment with osteophytes in the patella femoral joint including the trochlea osteophytes on the femur.  Bone-on-bone changes of the lateral compartment grade 3 chondral changes of the medial femoral condyle medial meniscus was intact lateral meniscus was intact PCL was intact ACL was partially deficient  Antibiotic: Ancef 2 g    The patient was identified in the preop holding area and the surgical site was confirmed as the right knee. Chart review and update were completed. The patient was taken to the operating room for spinal anesthesia. After successful spinal anesthesia Foley catheter was inserted. The patient was placed supine on the operating table.   the right leg was prepped with DuraPrep and draped sterilely. Timeout was completed. The limb was then exsanguinated a  6 inch Esmarch. The tourniquet was elevated to 300 mmHg.   A midline incision was made and taken down to the extensor mechanism followed by medial arthrotomy. The patella was everted. A synovectomy was performed as needed. The osteophytes were resected.  Anterior cruciate ligament and PCL and medial and lateral meniscus were resected.   a 3/8 inch drill bit was used to enter the femoral canal which was suctioned and irrigated until the fluid was clear. The distal femoral cut was set for 11 millimeter  resection with a 4  Right Valgus angle. This cut was completed and checked for flatness.   the femur was then measured to a size 4.    the tibia was subluxated forward and the external alignment guide was placed. We removed 6 mm of bone from the higher medial side. We set the guide for neutral varus valgus cut related to the  Mechanical axis of the tibia and for slope matching the patient's anatomy. Rotational alignment was set using the tibial tubercle, tibial spine and second metatarsal. The cutting block was pinned and the proximal tibia was resected.   A spacer block was placed starting with a 10 mm insert to check the extension gap.  The extension gap was not large enough and the distal femur was recut +2 mm.  10 mm block still did not fit an additional 4 mm was taken.  A 10 mm block then fit well   The 4-in-1 femoral cutting block was placed to match the femoral epicondyles and the 4 distal cuts were made.   A spacer block was placed starting with a 10 mm insert to check the flexion and extension gap.  The lateral side was tight and at this point laminar spreaders were placed in the joint and a piecrust release of the iliotibial band was performed.  The 10 mm spacer block was placed and then confirmed proper balance in flexion and extension.  We placed the femoral notch cutting guide size 4 and resected the notch.   Trial implants were placed using appropriate size femur , appropriate  size tibial baseplate which was measured after the proximal tibia resection. Tibial rotation was set patella tracking was normal   The tibia was then punched per manufacture technique making sure to avoid internal rotation.   The patella measured a size 22 mm   we resected down to a size 12 mm using a size 38 mm x 8 mm button. Total patella thickness was 20 mm   Final range of motion check was performed with the appropriate size trials as mentioned above. Satisfactory reduction and motion were obtained.    Trial implants were removed. The bone was irrigated and dried and the cement was mixed on the back table  exparel was injected in the soft tissues and posterior capsule of the knee  These implants were then cemented in place. Excess cement was removed. The cement was allowed to cure. Second irrigation was performed.    FInal range of motion check and stability check was completed  The wound was irrigated t a third time ,Hemovac drain was placed, extensor mechanism was closed with #1 Nurolon followed by 0 Monocryl and staples to reapproximate the skin edges and subcutaneous tissue.   Sterile dressing and cryocuff  was applied  The patient was taken recovery in stable condition

## 2018-04-10 ENCOUNTER — Encounter (HOSPITAL_COMMUNITY): Payer: Self-pay | Admitting: Orthopedic Surgery

## 2018-04-10 DIAGNOSIS — M1711 Unilateral primary osteoarthritis, right knee: Secondary | ICD-10-CM | POA: Diagnosis not present

## 2018-04-10 LAB — BASIC METABOLIC PANEL
Anion gap: 7 (ref 5–15)
BUN: 31 mg/dL — ABNORMAL HIGH (ref 8–23)
CHLORIDE: 108 mmol/L (ref 98–111)
CO2: 22 mmol/L (ref 22–32)
Calcium: 8.5 mg/dL — ABNORMAL LOW (ref 8.9–10.3)
Creatinine, Ser: 1.35 mg/dL — ABNORMAL HIGH (ref 0.44–1.00)
GFR calc Af Amer: 46 mL/min — ABNORMAL LOW (ref >60)
GFR calc non Af Amer: 40 mL/min — ABNORMAL LOW (ref >60)
Glucose, Bld: 118 mg/dL — ABNORMAL HIGH (ref 70–99)
Potassium: 4.5 mmol/L (ref 3.5–5.1)
Sodium: 137 mmol/L (ref 135–145)

## 2018-04-10 LAB — CBC
HCT: 29.5 % — ABNORMAL LOW (ref 36.0–46.0)
Hemoglobin: 9.4 g/dL — ABNORMAL LOW (ref 12.0–15.0)
MCH: 28.9 pg (ref 26.0–34.0)
MCHC: 31.9 g/dL (ref 30.0–36.0)
MCV: 90.8 fL (ref 80.0–100.0)
Platelets: 230 10*3/uL (ref 150–400)
RBC: 3.25 MIL/uL — ABNORMAL LOW (ref 3.87–5.11)
RDW: 13.2 % (ref 11.5–15.5)
WBC: 9.9 10*3/uL (ref 4.0–10.5)
nRBC: 0 % (ref 0.0–0.2)

## 2018-04-10 NOTE — Progress Notes (Addendum)
Physical Therapy Treatment Patient Details Name: Robin Villa MRN: [id number] DOB: 22-Sep-1950 Today's Date: 04/10/2018  # OF FEET WALKED: 150 ROM:  Flexion: 95            Extension: 0   History of Present Illness Robin Villa is a 68 y/o female, s/p Right TKA, 04/09/18, with the diagnosis of right knee osteoarthritis.    PT Comments    Pt supine in bed and willing to participate with therapy.  No reports of pain, just stiffness from being in one position.  Began with gait training, cueing to reduce circumferential movements and improve heel strike, toe push off for knee mobility with gait.  Therex focus on knee mobility with AROM 0-95 degrees.  EOS with CPM, adjusted to fit and began at 75 degrees.  Wrote begin and end time on dry erase board.  No reoprts of pain through session.  Did leave cryocuff on knee for pain and edema control.  Pt's son and grandson in room at [place].   Follow Up Recommendations  Home health PT;Supervision - Intermittent     Equipment Recommendations  None recommended by PT    Recommendations for Other Services       Precautions / Restrictions Precautions Precautions: Fall Restrictions Weight Bearing Restrictions: Yes RLE Weight Bearing: Weight bearing as tolerated    Mobility  Bed Mobility Overal bed mobility: Modified Independent Bed Mobility: Supine to Sit     Supine to sit: Min guard     General bed mobility comments: improvement for moving RLE  Transfers Overall transfer level: Modified independent Equipment used: Rolling walker (2 wheeled) Transfers: Sit to/from Stand Sit to Stand: Supervision Stand pivot transfers: Supervision;Min guard       General transfer comment: Improved mechanics with bed mobility, proper placement  Ambulation/Gait Ambulation/Gait assistance: Min guard Gait Distance (Feet): 150 Feet Assistive device: Rolling walker (2 wheeled) Gait Pattern/deviations: Decreased step length - right;Decreased stance time -  right;Decreased stride length Gait velocity: decreased   General Gait Details: Cueing for heel strike to toe push off, cueing to reduce circumferential and improve knee mechanics.  No LOB,  Slight labored cadence, limited secondary to fatigue.  No pain   Stairs             Wheelchair Mobility    Modified Rankin (Stroke Patients Only)       Balance Overall balance assessment: Needs assistance Sitting-balance support: Feet supported;No upper extremity supported Sitting balance-Leahy Scale: Good     Standing balance support: Bilateral upper extremity supported;During functional activity Standing balance-Leahy Scale: Fair Standing balance comment: using RW                            Cognition Arousal/Alertness: Awake/alert Behavior During Therapy: WFL for tasks assessed/performed Overall Cognitive Status: Within Functional Limits for tasks assessed                                        Exercises Total Joint Exercises Ankle Circles/Pumps: Supine;AROM;Strengthening;Right;10 reps Quad Sets: Supine;AROM;Strengthening;Right;10 reps Short Arc Quad: Supine;AROM;Strengthening;Right;10 reps Heel Slides: Supine;AROM;Strengthening;Right;15 reps Long Arc Quad: AAROM;10 reps;Seated Goniometric ROM: Rt knee: 0-94 degrees    General Comments        Pertinent Vitals/Pain Pain Assessment: No/denies pain Pain Score: 1  Pain Location: right knee Pain Descriptors / Indicators: Sore Pain Intervention(s): Limited activity within  patient's tolerance;Monitored during session    Home Living                      Prior Function            PT Goals (current goals can now be found in the care plan section) Acute Rehab PT Goals Patient Stated Goal: return home with family to assist PT Goal Formulation: With patient Time For Goal Achievement: 04/13/18 Potential to Achieve Goals: Good Progress towards PT goals: Progressing toward goals     Frequency    7X/week      PT Plan Current plan remains appropriate    Co-evaluation              AM-PAC PT "6 Clicks" Mobility   Outcome Measure  Help needed turning from your back to your side while in a flat bed without using bedrails?: None Help needed moving from lying on your back to sitting on the side of a flat bed without using bedrails?: A Little Help needed moving to and from a bed to a chair (including a wheelchair)?: A Little Help needed standing up from a chair using your arms (e.g., wheelchair or bedside chair)?: A Little Help needed to walk in hospital room?: A Little Help needed climbing 3-5 steps with a railing? : A Little 6 Click Score: 19    End of Session Equipment Utilized During Treatment: Gait belt Activity Tolerance: Patient tolerated treatment well;Patient limited by fatigue Patient left: in CPM Nurse Communication: Mobility status PT Visit Diagnosis: Unsteadiness on feet (R26.81);Other abnormalities of gait and mobility (R26.89);Muscle weakness (generalized) (M62.81)     Time: 3300-7622 PT Time Calculation (min) (ACUTE ONLY): 32 min  Charges:  $Gait Training: 8-22 mins $Therapeutic Exercise: 8-22 mins $Therapeutic Activity: 8-22 mins                     [address], LPTA; CBIS [phone]  Aldona Lento 04/10/2018, 5:52 PM

## 2018-04-10 NOTE — TOC Initial Note (Signed)
Transition of Care [facility]) - Initial/Assessment Note    Patient Details  Name: Robin Villa MRN: [id number] Date of Birth: Sep 13, 1950  Transition of Care [facility]) CM/SW Contact:    Ihor Gully, LCSW Phone Number: 04/10/2018, 6:20 PM  Clinical Narrative:                 At baseline, patient is fully independent. In regards to DME, patient has a walker and bedside commode. She desires a toilet riser. Patient advised that her insurance does not cover and a riser with handles will be $44 and without will be .$23 through [employer]. Patient stated that she would pick it up herself.  Patient is agreeable to [facility] services and indicated that she lives in [place].    Expected Discharge Plan: [place] Barriers to Discharge: No Barriers Identified   Patient Goals and CMS Choice Patient states their goals for this hospitalization and ongoing recovery are:: To return home and get back to baseline.  CMS Medicare.gov Compare Post Acute Care list provided to:: Patient Choice offered to / list presented to : Patient  Expected Discharge Plan and Services Expected Discharge Plan: [place] Choice: Durable Medical Equipment Living arrangements for the past 2 months: Single Family Home                 DME Arranged: Other see comment(toilet riser) DME Agency: [facility] HH Arranged: RN, PT [place] Agency: [facility] (formerly [employer])  Prior Living Arrangements/Services Living arrangements for the past 2 months: [place] with:: Adult Children Patient language and need for interpreter reviewed:: No Do you feel safe going back to the place where you live?: Yes      Need for Family Participation in Patient Care: No (Comment) Care giver support system in place?: Yes (comment)   Criminal Activity/Legal Involvement Pertinent to Current Situation/Hospitalization: No - Comment as needed  Activities  of Daily Living Home Assistive Devices/Equipment: None ADL Screening (condition at time of admission) Patient's cognitive ability adequate to safely complete daily activities?: Yes Is the patient deaf or have difficulty hearing?: No Does the patient have difficulty seeing, even when wearing glasses/contacts?: No Does the patient have difficulty concentrating, remembering, or making decisions?: No Patient able to express need for assistance with ADLs?: Yes Does the patient have difficulty dressing or bathing?: No Independently performs ADLs?: Yes (appropriate for developmental age) Does the patient have difficulty walking or climbing stairs?: Yes Weakness of Legs: Right Weakness of Arms/Hands: None  Permission Sought/Granted Permission sought to share information with : Other (comment)(Tim at [place] )       Permission granted to share info w AGENCY: [facility] [employer]         Emotional Assessment Appearance:: Appears stated age Attitude/Demeanor/Rapport: Gracious, Engaged, Ambitious Affect (typically observed): Accepting, Calm Orientation: : Oriented to Self, Oriented to Place, Oriented to Situation, Oriented to  Time Alcohol / Substance Use: Not Applicable Psych Involvement: No (comment)  Admission diagnosis:  Primary osteoarthritis of right knee [M17.11] Patient Active Problem List   Diagnosis Date Noted  . Primary osteoarthritis of right knee 04/09/2018  . Carotid artery disease ([place]) 05/31/2016  . CKD (chronic kidney disease), stage II 08/18/2015  . GERD (gastroesophageal reflux disease) 05/12/2015  . OA (osteoarthritis) of knee 07/25/2013  . Hyperlipidemia 12/04/2011  . Hemorrhoids 08/23/2011  . Essential hypertension, benign 03/20/2011  . Obesity 03/20/2011  . DUB (  dysfunctional uterine bleeding) 03/20/2011   PCP:  Alycia Rossetti, MD Pharmacy:   [facility] [address] [place] [address] [place] [address] [place] [address] Phone: [phone]  Fax: [phone]     Social Determinants of Health (SDOH) Interventions    Readmission Risk Interventions  No flowsheet data found.

## 2018-04-10 NOTE — Progress Notes (Signed)
Physical Therapy Treatment Patient Details Name: Robin Villa MRN: [id number] DOB: 12-28-1950 Today's Date: 04/10/2018   RIGHT KNEE ROM: 0-90 degrees AMBULATION DISTANCE: 150 feet using RW with Min Guard/Supervision   History of Present Illness Robin Villa is a 68 y/o female, s/p Right TKA, 04/09/18, with the diagnosis of right knee osteoarthritis.    PT Comments    Patient demonstrates increased endurance/distance for gait training with fair/good return for right heel to toe stepping without loss of balance, limited secondary to c/o fatigue and tolerated sitting up in chair with RLE dangling after therapy.  Patient will benefit from continued physical therapy in hospital and recommended venue below to increase strength, balance, endurance for safe ADLs and gait.   Follow Up Recommendations  Home health PT;Supervision - Intermittent     Equipment Recommendations  None recommended by PT    Recommendations for Other Services       Precautions / Restrictions Precautions Precautions: Fall Restrictions Weight Bearing Restrictions: Yes RLE Weight Bearing: Weight bearing as tolerated    Mobility  Bed Mobility Overal bed mobility: Needs Assistance Bed Mobility: Supine to Sit     Supine to sit: Min guard;Supervision     General bed mobility comments: improvement for moving RLE  Transfers Overall transfer level: Needs assistance Equipment used: Rolling walker (2 wheeled) Transfers: Sit to/from [facility] Sit to Stand: Supervision;Min guard Stand pivot transfers: Supervision;Min guard       General transfer comment: slightly labored movement  Ambulation/Gait Ambulation/Gait assistance: Min guard;Supervision Gait Distance (Feet): 150 Feet Assistive device: Rolling walker (2 wheeled) Gait Pattern/deviations: Decreased step length - right;Decreased stance time - right;Decreased stride length Gait velocity: decreased   General Gait Details: increased  endurance/distance for ambulation with slightly labored cadence with fair return for right heel to toe stepping, no loss of balance, limited secondary to c/o fatigue   Stairs             Wheelchair Mobility    Modified Rankin (Stroke Patients Only)       Balance Overall balance assessment: Needs assistance Sitting-balance support: Feet supported;No upper extremity supported Sitting balance-Leahy Scale: Good     Standing balance support: Bilateral upper extremity supported;During functional activity Standing balance-Leahy Scale: Fair Standing balance comment: using RW                            Cognition Arousal/Alertness: Awake/alert Behavior During Therapy: WFL for tasks assessed/performed Overall Cognitive Status: Within Functional Limits for tasks assessed                                        Exercises Total Joint Exercises Ankle Circles/Pumps: Supine;AROM;Strengthening;Right;10 reps Quad Sets: Supine;AROM;Strengthening;Right;10 reps Short Arc Quad: Supine;AROM;Strengthening;Right;10 reps Heel Slides: Supine;AROM;Strengthening;Right;15 reps Goniometric ROM: right knee: 0-90 degrees    General Comments        Pertinent Vitals/Pain Pain Assessment: 0-10 Pain Score: 1  Pain Location: right knee Pain Descriptors / Indicators: Sore Pain Intervention(s): Limited activity within patient's tolerance;Monitored during session    Home Living                      Prior Function            PT Goals (current goals can now be found in the care plan section) Acute Rehab PT Goals Patient  Stated Goal: return home with family to assist PT Goal Formulation: With patient Time For Goal Achievement: 04/13/18 Potential to Achieve Goals: Good Progress towards PT goals: Progressing toward goals    Frequency    7X/week      PT Plan Current plan remains appropriate    Co-evaluation              AM-PAC PT "6 Clicks"  Mobility   Outcome Measure  Help needed turning from your back to your side while in a flat bed without using bedrails?: None Help needed moving from lying on your back to sitting on the side of a flat bed without using bedrails?: A Little Help needed moving to and from a bed to a chair (including a wheelchair)?: A Little Help needed standing up from a chair using your arms (e.g., wheelchair or bedside chair)?: A Little Help needed to walk in hospital room?: A Little Help needed climbing 3-5 steps with a railing? : A Little 6 Click Score: 19    End of Session   Activity Tolerance: Patient tolerated treatment well;Patient limited by fatigue Patient left: in chair;with call bell/phone within reach Nurse Communication: Mobility status PT Visit Diagnosis: Unsteadiness on feet (R26.81);Other abnormalities of gait and mobility (R26.89);Muscle weakness (generalized) (M62.81)     Time: 7915-0569 PT Time Calculation (min) (ACUTE ONLY): 32 min  Charges:  $Gait Training: 8-22 mins $Therapeutic Exercise: 8-22 mins                     2:06 PM, 04/10/18 Lonell Grandchild, MPT Physical Therapist with [facility] [phone] office [phone] mobile phone

## 2018-04-10 NOTE — Progress Notes (Signed)
Patient ID: Robin Villa, female   DOB: Jun 06, 1950, 68 y.o.   MRN: [id number] BP (!) 113/54 (BP Location: Right Arm)   Pulse 63   Temp 98.3 F (36.8 C) (Oral)   Resp 18   Ht 5\' 5"  (1.651 m)   Wt 84.4 kg   SpO2 98%   BMI 30.95 kg/m  Postop day #1 right total knee  CBC Latest Ref Rng & Units 04/10/2018 04/04/2018 03/13/2018  WBC 4.0 - 10.5 K/uL 9.9 8.8 8.4  Hemoglobin 12.0 - 15.0 g/dL 9.4(L) 10.4(L) 10.6(L)  Hematocrit 36.0 - 46.0 % 29.5(L) 32.9(L) 32.5(L)  Platelets 150 - 400 K/uL 230 226 266   BMP Latest Ref Rng & Units 04/10/2018 04/04/2018 03/13/2018  Glucose 70 - 99 mg/dL 118(H) 86 100(H)  BUN 8 - 23 mg/dL 31(H) 32(H) 32(H)  Creatinine 0.44 - 1.00 mg/dL 1.35(H) 1.49(H) 1.43(H)  BUN/Creat Ratio 6 - 22 (calc) - - 22  Sodium 135 - 145 mmol/L 137 139 138  Potassium 3.5 - 5.1 mmol/L 4.5 4.3 4.8  Chloride 98 - 111 mmol/L 108 111 106  CO2 22 - 32 mmol/L 22 21(L) 25  Calcium 8.9 - 10.3 mg/dL 8.5(L) 9.0 9.7    Patient says she feels well she is neurovascular intact in the right lower extremity pain is well controlled  Patient ambulated over 100 feet yesterday and should be ready for discharge tomorrow

## 2018-04-10 NOTE — Anesthesia Postprocedure Evaluation (Signed)
Anesthesia Post Note  Patient: Robin Villa  Procedure(s) Performed: TOTAL KNEE ARTHROPLASTY (Right Knee)  Patient location during evaluation: Nursing Unit Anesthesia Type: General Level of consciousness: awake and alert and patient cooperative Pain management: pain level controlled Vital Signs Assessment: post-procedure vital signs reviewed and stable Respiratory status: spontaneous breathing, nonlabored ventilation and respiratory function stable Cardiovascular status: blood pressure returned to baseline Postop Assessment: no apparent nausea or vomiting Anesthetic complications: no     Last Vitals:  Vitals:   04/10/18 0029 04/10/18 0411  BP: (!) 125/53 (!) 113/54  Pulse: 71 63  Resp: 16 18  Temp: 36.9 C 36.8 C  SpO2: 100% 98%    Last Pain:  Vitals:   04/10/18 0411  TempSrc: Oral  PainSc:                  Sonyia Muro J

## 2018-04-10 NOTE — Addendum Note (Signed)
Addendum  created 04/10/18 0743 by Charmaine Downs, CRNA   Clinical Note Signed

## 2018-04-10 NOTE — Progress Notes (Signed)
Obtained IV. Dr Aline Brochure contacted due to TXA not being administered at scheduled time due to IV infiltrating on previous shift. Will not administer TXA per orders.

## 2018-04-10 NOTE — Care Management Obs Status (Signed)
[place] NOTIFICATION   Patient Details  Name: Robin Villa MRN: [id number] Date of Birth: 13-Dec-1950   Medicare Observation Status Notification Given:  Yes    Tommy Medal 04/10/2018, 12:10 PM

## 2018-04-11 DIAGNOSIS — M1711 Unilateral primary osteoarthritis, right knee: Secondary | ICD-10-CM | POA: Diagnosis not present

## 2018-04-11 MED ORDER — HYDROCODONE-ACETAMINOPHEN 5-325 MG PO TABS: 1.0000 | ORAL_TABLET | ORAL | 0 refills | Status: AC | PRN

## 2018-04-11 MED ORDER — TIZANIDINE HCL 2 MG PO TABS: 2.0000 mg | ORAL_TABLET | Freq: Four times a day (QID) | ORAL | 0 refills | Status: AC | PRN

## 2018-04-11 MED ORDER — DOCUSATE SODIUM 100 MG PO CAPS: 100.0000 mg | ORAL_CAPSULE | Freq: Two times a day (BID) | ORAL | 0 refills | Status: DC

## 2018-04-11 NOTE — Clinical Social Work Note (Signed)
Message was sent to attending requesting [place] orders.      Marla Pouliot, Clydene Pugh, LCSW

## 2018-04-11 NOTE — Progress Notes (Signed)
Physical Therapy Treatment Patient Details Name: Robin Villa MRN: [id number] DOB: 13-Jun-1950 Today's Date: 04/11/2018  R Knee ROM: 0-95 degrees Ambulation distance: 150 ft with RW, SUPV    History of Present Illness Robin Villa is a 68 y/o female, s/p Right TKA, 04/09/18, with the diagnosis of right knee osteoarthritis.    PT Comments    Patient presents supine in bed with R LE using extension foam and agreeable to therapy. Pt with reduced pain today allowing for more independence with mobility. Pt able to complete transfers with supervision using RW and no unsteadiness noted. Pt with initial step-to gait pattern guarding R LE, but with time improved to fluid, step-through gait pattern requiring increased time and steps with turns, but denies pain and no loss of balance. Pt able to ambulate to bathroom and transfer on/off toilet using RW without loss of balance or instability. Pt limited by fatigue with gait training. Pt left up in chair, call bell in hand, and requesting wash-up and nurse tech notified. Patient will benefit from continued physical therapy in hospital and recommended venue below to increase strength, balance, endurance for safe ADLs and gait.    Follow Up Recommendations  Home health PT;Supervision - Intermittent     Equipment Recommendations  None recommended by PT    Recommendations for Other Services       Precautions / Restrictions Precautions Precautions: Fall Restrictions Weight Bearing Restrictions: Yes RLE Weight Bearing: Weight bearing as tolerated    Mobility  Bed Mobility Overal bed mobility: Modified Independent Bed Mobility: Supine to Sit     Supine to sit: Modified independent (Device/Increase time)     General bed mobility comments: increased time  Transfers Overall transfer level: Modified independent Equipment used: Rolling walker (2 wheeled) Transfers: Sit to/from [facility] Sit to Stand: Modified independent  (Device/Increase time) Stand pivot transfers: Supervision       General transfer comment: increased time, proper hand placement and good steadiness upon standing  Ambulation/Gait Ambulation/Gait assistance: Supervision Gait Distance (Feet): 150 Feet Assistive device: Rolling walker (2 wheeled) Gait Pattern/deviations: Decreased step length - left;Decreased stance time - right;Decreased stride length Gait velocity: decreased   General Gait Details: Pt with initial decreased R single limb stance and decreased L step length with R knee static flexion, but improves to more fluid, step-through gait pattern with time, no loss of balance, no pain, increased time with turns   Marine scientist Rankin (Stroke Patients Only)       Balance Overall balance assessment: Modified Independent Sitting-balance support: Feet supported;No upper extremity supported Sitting balance-Leahy Scale: Good Sitting balance - Comments: seated EOB   Standing balance support: Bilateral upper extremity supported;During functional activity Standing balance-Leahy Scale: Fair Standing balance comment: using RW                            Cognition Arousal/Alertness: Awake/alert Behavior During Therapy: WFL for tasks assessed/performed Overall Cognitive Status: Within Functional Limits for tasks assessed                                        Exercises Total Joint Exercises Goniometric ROM: R knee: 0-95 degrees    General Comments        Pertinent Vitals/Pain Pain  Score: 2  Pain Location: RLE Pain Descriptors / Indicators: Sore Pain Intervention(s): Limited activity within patient's tolerance;Monitored during session;Repositioned    Home Living                      Prior Function            PT Goals (current goals can now be found in the care plan section) Acute Rehab PT Goals Patient Stated Goal: return home with  family to assist PT Goal Formulation: With patient Time For Goal Achievement: 04/13/18 Potential to Achieve Goals: Good Progress towards PT goals: Progressing toward goals    Frequency    7X/week      PT Plan Current plan remains appropriate    Co-evaluation              AM-PAC PT "6 Clicks" Mobility   Outcome Measure  Help needed turning from your back to your side while in a flat bed without using bedrails?: None Help needed moving from lying on your back to sitting on the side of a flat bed without using bedrails?: None Help needed moving to and from a bed to a chair (including a wheelchair)?: A Little Help needed standing up from a chair using your arms (e.g., wheelchair or bedside chair)?: A Little Help needed to walk in hospital room?: A Little Help needed climbing 3-5 steps with a railing? : A Little 6 Click Score: 20    End of Session   Activity Tolerance: Patient tolerated treatment well;Patient limited by fatigue Patient left: in chair;with call bell/phone within reach Nurse Communication: Mobility status PT Visit Diagnosis: Unsteadiness on feet (R26.81);Other abnormalities of gait and mobility (R26.89);Muscle weakness (generalized) (M62.81)     Time: 4166-0630 PT Time Calculation (min) (ACUTE ONLY): 13 min  Charges:  $Gait Training: 8-22 mins                    10:21 AM, 04/11/18 Robin Villa, DPT Physical Therapist with [facility] [phone] office

## 2018-04-11 NOTE — Progress Notes (Addendum)
Has been ambulating in room independently and denies pain.  Dressing to right knee dry and intact. IV removed and discharge instructions reviewed.  Follow up appt scheduled.  Waiting on son to drive home. CPM to be delivered to home

## 2018-04-11 NOTE — Discharge Summary (Signed)
  Physician Discharge Summary  Patient ID: Robin Villa MRN: [id number] DOB/AGE: 70-Jul-1952 70 y.o.  Admit date: 04/09/2018 Discharge date: 04/11/2018  Admission Diagnoses: Primary osteoarthritis right knee  Discharge Diagnoses: Same  Discharged Condition: good  Procedure: Right total knee Sigma [place] fixed-bearing posterior stabilized 4 femur 3 tibia 10 polyethylene insert  Hospital Course:  Hospital day 1 on March 10 uncomplicated right total knee did well  Hospital day 2 and 3 March 11 and 12 progressed well with physical therapy pain well controlled   Name: Robin Villa MRN: [id number] DOB: 1950/04/16 Today's Date: 04/11/2018   R Knee ROM: 0-95 degrees Ambulation distance: 150 ft with RW, SUPV    Discharge Exam: Blood pressure (!) 113/50, pulse 80, temperature 98.2 F (36.8 C), temperature source Oral, resp. rate 18, height 5\' 5"  (1.651 m), weight 84.4 kg, SpO2 99 %. Cognition normal wound check normal  Disposition:    Allergies as of 04/11/2018   No Known Allergies     Medication List    TAKE these medications   aspirin 81 MG tablet Take 1 tablet (81 mg total) by mouth daily.   Calcium + D3 600-200 MG-UNIT Tabs Take 1 tablet by mouth 2 (two) times daily.   docusate sodium 100 MG capsule Commonly known as:  COLACE Take 1 capsule (100 mg total) by mouth 2 (two) times daily.   esomeprazole 40 MG capsule Commonly known as:  NexIUM Take 1 capsule (40 mg total) by mouth daily.   HYDROcodone-acetaminophen 5-325 MG tablet Commonly known as:  NORCO/VICODIN Take 1 tablet by mouth every 4 (four) hours as needed for up to 7 days for moderate pain.   ibuprofen 200 MG tablet Commonly known as:  ADVIL,MOTRIN Take 800 mg by mouth every 6 (six) hours as needed for moderate pain.   lisinopril-hydrochlorothiazide 20-25 MG tablet Commonly known as:  PRINZIDE,ZESTORETIC TAKE 1 TABLET BY MOUTH ONCE DAILY   multivitamin with minerals tablet Take 1 tablet by mouth daily.    simvastatin 20 MG tablet Commonly known as:  ZOCOR TAKE ONE TABLET BY MOUTH AT BEDTIME   tiZANidine 2 MG tablet Commonly known as:  ZANAFLEX Take 1 tablet (2 mg total) by mouth every 6 (six) hours as needed for up to 14 days for muscle spasms.      Follow-up Information    Carole Civil, MD Follow up.   Specialties:  Orthopedic Surgery, Radiology Contact information: [address] [place] [address] [phone]           Signed: Arther Abbott 04/11/2018, 1:01 PM

## 2018-04-11 NOTE — Progress Notes (Signed)
Patient ID: Robin Villa, female   DOB: [birth year], 68 y.o.   MRN: [id number] BP (!) 113/50 (BP Location: Right Arm)   Pulse 80   Temp 98.2 F (36.8 C) (Oral)   Resp 18   Ht 5\' 5"  (1.651 m)   Wt 84.4 kg   SpO2 99%   BMI 30.95 kg/m   Postop day #2 right total knee  CBC Latest Ref Rng & Units 04/10/2018 04/04/2018 03/13/2018  WBC 4.0 - 10.5 K/uL 9.9 8.8 8.4  Hemoglobin 12.0 - 15.0 g/dL 9.4(L) 10.4(L) 10.6(L)  Hematocrit 36.0 - 46.0 % 29.5(L) 32.9(L) 32.5(L)  Platelets 150 - 400 K/uL 230 226 266   BMP Latest Ref Rng & Units 04/10/2018 04/04/2018 03/13/2018  Glucose 70 - 99 mg/dL 118(H) 86 100(H)  BUN 8 - 23 mg/dL 31(H) 32(H) 32(H)  Creatinine 0.44 - 1.00 mg/dL 1.35(H) 1.49(H) 1.43(H)  BUN/Creat Ratio 6 - 22 (calc) - - 22  Sodium 135 - 145 mmol/L 137 139 138  Potassium 3.5 - 5.1 mmol/L 4.5 4.3 4.8  Chloride 98 - 111 mmol/L 108 111 106  CO2 22 - 32 mmol/L 22 21(L) 25  Calcium 8.9 - 10.3 mg/dL 8.5(L) 9.0 9.7   She is doing well.  Her pain is well controlled.  Her wound looks clean.  Her calf is soft and supple.  She has no neurovascular deficits.  She is awake alert and oriented x3 mood and affect are normal.  Ready for discharge

## 2018-04-16 LAB — BPAM RBC
BLOOD PRODUCT EXPIRATION DATE: 202003212359
Blood Product Expiration Date: 202003212359
UNIT TYPE AND RH: 5100
Unit Type and Rh: 5100

## 2018-04-16 LAB — TYPE AND SCREEN
ABO/RH(D): O POS
Antibody Screen: NEGATIVE
UNIT DIVISION: 0
Unit division: 0

## 2018-04-22 ENCOUNTER — Ambulatory Visit (HOSPITAL_COMMUNITY): Payer: Medicare Other

## 2018-04-22 DIAGNOSIS — Z96651 Presence of right artificial knee joint: Secondary | ICD-10-CM | POA: Insufficient documentation

## 2018-04-24 ENCOUNTER — Telehealth: Payer: Self-pay | Admitting: Orthopedic Surgery

## 2018-04-24 ENCOUNTER — Encounter: Payer: Self-pay | Admitting: Orthopedic Surgery

## 2018-04-24 ENCOUNTER — Ambulatory Visit (INDEPENDENT_AMBULATORY_CARE_PROVIDER_SITE_OTHER): Payer: Medicare Other | Admitting: Orthopedic Surgery

## 2018-04-24 ENCOUNTER — Other Ambulatory Visit: Payer: Self-pay

## 2018-04-24 VITALS — Temp 98.1°F

## 2018-04-24 DIAGNOSIS — Z96651 Presence of right artificial knee joint: Secondary | ICD-10-CM

## 2018-04-24 MED ORDER — HYDROCODONE-ACETAMINOPHEN 5-325 MG PO TABS: 1.0000 | ORAL_TABLET | Freq: Four times a day (QID) | ORAL | 0 refills | Status: DC | PRN

## 2018-04-24 NOTE — Progress Notes (Addendum)
Patient ID: Robin Villa, female   DOB: 07/28/1950, 68 y.o.   MRN: [id number]  Chief Complaint  Patient presents with  . Routine Post Op    04/09/18 right total knee replacement     HPI Robin Villa is a 68 y.o. female.    68 years old status post right total knee Sigma fixed bearing posterior stabilized implant  Doing well ambulating with a cane home physical therapy which will be continued secondary [place] in 19     No Known Allergies  Current Outpatient Medications  Medication Sig Dispense Refill  . aspirin 81 MG tablet Take 1 tablet (81 mg total) by mouth daily. 30 tablet   . Calcium Carb-Cholecalciferol (CALCIUM + D3) 600-200 MG-UNIT TABS Take 1 tablet by mouth 2 (two) times daily.     Marland Kitchen docusate sodium (COLACE) 100 MG capsule Take 1 capsule (100 mg total) by mouth 2 (two) times daily. 10 capsule 0  . esomeprazole (NEXIUM) 40 MG capsule Take 1 capsule (40 mg total) by mouth daily. 90 capsule 3  . HYDROcodone-acetaminophen (NORCO) 5-325 MG tablet Take 1 tablet by mouth every 6 (six) hours as needed for moderate pain. 30 tablet 0  . ibuprofen (ADVIL,MOTRIN) 200 MG tablet Take 800 mg by mouth every 6 (six) hours as needed for moderate pain.     Marland Kitchen lisinopril-hydrochlorothiazide (PRINZIDE,ZESTORETIC) 20-25 MG tablet TAKE 1 TABLET BY MOUTH ONCE DAILY (Patient taking differently: Take 1 tablet by mouth daily. ) 90 tablet 2  . Multiple Vitamins-Minerals (MULTIVITAMIN WITH MINERALS) tablet Take 1 tablet by mouth daily.    . simvastatin (ZOCOR) 20 MG tablet TAKE ONE TABLET BY MOUTH AT BEDTIME (Patient taking differently: Take 20 mg by mouth at bedtime. ) 90 tablet 0  . tiZANidine (ZANAFLEX) 2 MG tablet Take 1 tablet (2 mg total) by mouth every 6 (six) hours as needed for up to 14 days for muscle spasms. 56 tablet 0   No current facility-administered medications for this visit.       Physical Exam Physical Exam   Appearance of incision: Incision is clean dry and intact we removed the  staples  The calf was supple and the Homans sign was normal, there is minimal peripheral edema  0-75 ROM  which is  A loss of 25 so I told her to work on the flexion   Assessment and plan The patient is doing well and is in good condition  Follow-up will be 6 weeks  9:48 AM Arther Abbott, MD 04/24/2018

## 2018-04-24 NOTE — Patient Instructions (Signed)
Remove the stockings continue the aspirin  Continue the therapy  Use ice to control swelling  Increase activity slowly

## 2018-04-24 NOTE — Telephone Encounter (Signed)
Called back to patient to relay per Dr Ruthe Mannan response.  Aware.

## 2018-04-24 NOTE — Telephone Encounter (Signed)
-----   Message from Carole Civil, MD sent at 04/24/2018  1:16 PM EDT ----- Regarding: RE: Can patient drive yet? Ok in 2 weeks  ----- Message ----- From: Uvaldo Bristle Sent: 04/24/2018   9:53 AM EDT To: Carole Civil, MD Subject: Can patient drive yet?                         Dr Aline Brochure,  Patient Robin Villa XH#[id number] - asking if she can begin driving?  Total knee surgery was performed 04/09/18.

## 2018-05-06 ENCOUNTER — Other Ambulatory Visit: Payer: Self-pay | Admitting: Orthopedic Surgery

## 2018-05-06 ENCOUNTER — Telehealth: Payer: Self-pay | Admitting: Orthopedic Surgery

## 2018-05-06 DIAGNOSIS — Z96651 Presence of right artificial knee joint: Secondary | ICD-10-CM

## 2018-05-06 NOTE — Telephone Encounter (Signed)
Robin Villa called and said she has been told that this is last week of home therapy.  She wants to know if she can continue PT at [facility]  in [place].  If so, she wants [place] to go ahead and send order so they can get her scheduled.  Thanks

## 2018-05-06 NOTE — Telephone Encounter (Signed)
Robin Villa requests on Tizanidine (Zanaflex) 2 mgs.  Qty 56  Sig: Take 1 tablet (2 mg total) by mouth every 6 (six) hours as needed for up to 14 days for muscle spasms.  Patient states she uses [facility] in [place]

## 2018-05-07 NOTE — Telephone Encounter (Signed)
Called patient told her they will call her to schedule. I will send orders tomorrow to [facility]

## 2018-05-08 MED ORDER — TIZANIDINE HCL 2 MG PO CAPS: 2.0000 mg | ORAL_CAPSULE | Freq: Four times a day (QID) | ORAL | 0 refills | Status: DC | PRN

## 2018-05-20 ENCOUNTER — Ambulatory Visit (HOSPITAL_COMMUNITY): Payer: Medicare Other

## 2018-06-05 ENCOUNTER — Ambulatory Visit: Payer: Self-pay | Admitting: Orthopedic Surgery

## 2018-06-05 ENCOUNTER — Telehealth: Payer: Self-pay | Admitting: Orthopedic Surgery

## 2018-06-05 NOTE — Telephone Encounter (Signed)
Called patient; scheduled accordingly. Patient aware.

## 2018-06-05 NOTE — Telephone Encounter (Signed)
Patient missed today's follow up/post op visit appointment. Please advise if okay to still come today, for afternoon - states she can come if needs to; or is she to have virtual visit?  Patient's ph# is [phone]

## 2018-06-05 NOTE — Telephone Encounter (Signed)
Reschedule a virtual

## 2018-06-07 ENCOUNTER — Other Ambulatory Visit: Payer: Self-pay

## 2018-06-07 ENCOUNTER — Ambulatory Visit (INDEPENDENT_AMBULATORY_CARE_PROVIDER_SITE_OTHER): Payer: Medicare Other | Admitting: Orthopedic Surgery

## 2018-06-07 DIAGNOSIS — Z96651 Presence of right artificial knee joint: Secondary | ICD-10-CM

## 2018-06-07 NOTE — Patient Instructions (Signed)
Go to PT

## 2018-06-07 NOTE — Progress Notes (Signed)
Chief Complaint  Patient presents with  . Procedure    3/10 right tka    68 years old had a right total knee on March 10.  She says she is doing well not requiring any pain meds and no assistive devices for walking  She is able to mow the lawn on a riding lawn more  She is bending her knee well going to physical therapy as prescribed/([facility]) 95 degrees at PT on April 15  Currently having no issues  Recommend a follow-up virtual visit in a month   Virtual Visit via Telephone Note  I connected with Robin Villa on 06/07/18 at 11:20 AM EDT by telephone and verified that I am speaking with the correct person using two identifiers.  Location: Patient: home  Provider: office    I discussed the limitations, risks, security and privacy concerns of performing an evaluation and management service by telephone and the availability of in person appointments. I also discussed with the patient that there may be a patient responsible charge related to this service. The patient expressed understanding and agreed to proceed.    I discussed the assessment and treatment plan with the patient. The patient was provided an opportunity to ask questions and all were answered. The patient agreed with the plan and demonstrated an understanding of the instructions.   The patient was advised to call back or seek an in-person evaluation if the symptoms worsen or if the condition fails to improve as anticipated.  I provided 3 minutes of non-face-to-face time during this encounter.   Arther Abbott, MD

## 2018-06-12 ENCOUNTER — Other Ambulatory Visit (HOSPITAL_COMMUNITY): Payer: Self-pay | Admitting: Family Medicine

## 2018-06-12 ENCOUNTER — Other Ambulatory Visit: Payer: Self-pay

## 2018-06-12 ENCOUNTER — Ambulatory Visit (HOSPITAL_COMMUNITY)
Admission: RE | Admit: 2018-06-12 | Discharge: 2018-06-12 | Disposition: A | Discharge status: Home / Self Care | Payer: Medicare Other | Source: Ambulatory Visit | Attending: Family Medicine | Admitting: Family Medicine

## 2018-06-12 DIAGNOSIS — R928 Other abnormal and inconclusive findings on diagnostic imaging of breast: Secondary | ICD-10-CM

## 2018-06-12 DIAGNOSIS — Z1239 Encounter for other screening for malignant neoplasm of breast: Secondary | ICD-10-CM

## 2018-06-12 DIAGNOSIS — Z1231 Encounter for screening mammogram for malignant neoplasm of breast: Secondary | ICD-10-CM | POA: Diagnosis not present

## 2018-06-18 ENCOUNTER — Ambulatory Visit (HOSPITAL_COMMUNITY)
Admission: RE | Admit: 2018-06-18 | Discharge: 2018-06-18 | Disposition: A | Discharge status: Home / Self Care | Payer: Medicare Other | Source: Ambulatory Visit | Attending: Family Medicine | Admitting: Family Medicine

## 2018-06-18 ENCOUNTER — Ambulatory Visit (HOSPITAL_COMMUNITY): Admission: RE | Admit: 2018-06-18 | Payer: Medicare Other | Source: Ambulatory Visit

## 2018-06-18 ENCOUNTER — Other Ambulatory Visit: Payer: Self-pay

## 2018-06-18 DIAGNOSIS — R928 Other abnormal and inconclusive findings on diagnostic imaging of breast: Secondary | ICD-10-CM | POA: Diagnosis present

## 2018-07-05 ENCOUNTER — Other Ambulatory Visit: Payer: Self-pay

## 2018-07-05 MED ORDER — SIMVASTATIN 20 MG PO TABS: 20.0000 mg | ORAL_TABLET | Freq: Every day | ORAL | 1 refills | Status: DC

## 2018-07-12 ENCOUNTER — Other Ambulatory Visit: Payer: Self-pay

## 2018-07-12 ENCOUNTER — Ambulatory Visit (INDEPENDENT_AMBULATORY_CARE_PROVIDER_SITE_OTHER): Payer: Medicare Other | Admitting: Orthopedic Surgery

## 2018-07-12 DIAGNOSIS — Z96651 Presence of right artificial knee joint: Secondary | ICD-10-CM

## 2018-07-12 NOTE — Patient Instructions (Signed)
Return 1 year from surgery for x-rays

## 2018-07-12 NOTE — Progress Notes (Signed)
Virtual Visit via Telephone Note  I connected with Robin Villa on 07/12/18 at 11:40 AM EDT by telephone and verified that I am speaking with the correct person using two identifiers. Jun 09, 1950  Location: Patient: home Provider: office  Postop appointment status post right total knee April 09, 2018  She has no complaints  Finished therapy   Pain controlled  Cane no   Follow-up next year 2021 x-ray right total knee     I discussed the limitations, risks, security and privacy concerns of performing an evaluation and management service by telephone and the availability of in person appointments. I also discussed with the patient that there may be a patient responsible charge related to this service. The patient expressed understanding and agreed to proceed.    I discussed the assessment and treatment plan with the patient. The patient was provided an opportunity to ask questions and all were answered. The patient agreed with the plan and demonstrated an understanding of the instructions.   The patient was advised to call back or seek an in-person evaluation if the symptoms worsen or if the condition fails to improve as anticipated.  I provided 2 minutes of non-face-to-face time during this encounter.   Arther Abbott, MD

## 2018-08-07 ENCOUNTER — Ambulatory Visit: Payer: Self-pay | Admitting: Orthopedic Surgery

## 2018-09-13 ENCOUNTER — Encounter: Payer: Self-pay | Admitting: Family Medicine

## 2018-09-13 ENCOUNTER — Other Ambulatory Visit: Payer: Self-pay

## 2018-09-13 ENCOUNTER — Ambulatory Visit (INDEPENDENT_AMBULATORY_CARE_PROVIDER_SITE_OTHER): Payer: Medicare Other | Admitting: Family Medicine

## 2018-09-13 VITALS — BP 138/74 | HR 68 | Temp 98.6°F | Resp 14 | Ht 66.0 in | Wt 195.0 lb

## 2018-09-13 DIAGNOSIS — E7800 Pure hypercholesterolemia, unspecified: Secondary | ICD-10-CM

## 2018-09-13 DIAGNOSIS — I10 Essential (primary) hypertension: Secondary | ICD-10-CM

## 2018-09-13 DIAGNOSIS — E6609 Other obesity due to excess calories: Secondary | ICD-10-CM

## 2018-09-13 DIAGNOSIS — N182 Chronic kidney disease, stage 2 (mild): Secondary | ICD-10-CM

## 2018-09-13 DIAGNOSIS — Z6831 Body mass index (BMI) 31.0-31.9, adult: Secondary | ICD-10-CM

## 2018-09-13 NOTE — Patient Instructions (Signed)
F/U 6 months for Physical  We will call with lab results

## 2018-09-13 NOTE — Progress Notes (Signed)
   Subjective:    Patient ID: Robin Villa, female    DOB: 1950-08-25, 68 y.o.   MRN: [id number]  Patient presents for Follow-up (is fasting)   Pt here to f/u chronic medical problems. Medications reviewed      HTN- taking lisinopril HCTZ without any difficulty    She has had right knee replacement doing well . No current pain medications   Hyperlipidemia, on zocor, last LDL 145    No concerns       Review Of Systems:  GEN- denies fatigue, fever, weight loss,weakness, recent illness HEENT- denies eye drainage, change in vision, nasal discharge, CVS- denies chest pain, palpitations RESP- denies SOB, cough, wheeze ABD- denies N/V, change in stools, abd pain GU- denies dysuria, hematuria, dribbling, incontinence MSK- denies joint pain, muscle aches, injury Neuro- denies headache, dizziness, syncope, seizure activity       Objective:    BP 138/74   Pulse 68   Temp 98.6 F (37 C) (Oral)   Resp 14   Ht 5\' 6"  (1.676 m)   Wt 195 lb (88.5 kg)   SpO2 100%   BMI 31.47 kg/m  GEN- NAD, alert and oriented x3 HEENT- PERRL, EOMI, non injected sclera, pink conjunctiva, MMM, oropharynx clear Neck- Supple, no thyromegaly CVS- RRR, no murmur RESP-CTAB ABD-NABS,soft,NT,ND EXT- trace bilat ankle edema Pulses- Radial, DP- 2+        Assessment & Plan:      Problem List Items Addressed This Visit      Unprioritized   CKD (chronic kidney disease), stage II   Relevant Orders   Comprehensive metabolic panel (Completed)   Essential hypertension, benign - Primary    Controlled no changes, fasting labs today      Relevant Orders   CBC with Differential/Platelet (Completed)   Comprehensive metabolic panel (Completed)   Hyperlipidemia    Continue statin drug, goal LDL less than 100 with other comorbidites      Relevant Orders   Lipid panel (Completed)   Obesity      Note: This dictation was prepared with Dragon dictation along with smaller phrase technology. Any  transcriptional errors that result from this process are unintentional.

## 2018-09-14 LAB — CBC WITH DIFFERENTIAL/PLATELET
Absolute Monocytes: 763 {cells}/uL (ref 200–950)
Basophils Absolute: 50 {cells}/uL (ref 0–200)
Basophils Relative: 0.7 %
Eosinophils Absolute: 202 {cells}/uL (ref 15–500)
Eosinophils Relative: 2.8 %
HCT: 33.7 % — ABNORMAL LOW (ref 35.0–45.0)
Hemoglobin: 11.1 g/dL — ABNORMAL LOW (ref 11.7–15.5)
Lymphs Abs: 1217 {cells}/uL (ref 850–3900)
MCH: 28.3 pg (ref 27.0–33.0)
MCHC: 32.9 g/dL (ref 32.0–36.0)
MCV: 86 fL (ref 80.0–100.0)
MPV: 12.5 fL (ref 7.5–12.5)
Monocytes Relative: 10.6 %
Neutro Abs: 4968 {cells}/uL (ref 1500–7800)
Neutrophils Relative %: 69 %
Platelets: 239 Thousand/uL (ref 140–400)
RBC: 3.92 Million/uL (ref 3.80–5.10)
RDW: 13.6 % (ref 11.0–15.0)
Total Lymphocyte: 16.9 %
WBC: 7.2 Thousand/uL (ref 3.8–10.8)

## 2018-09-14 LAB — LIPID PANEL
Cholesterol: 180 mg/dL (ref <200)
HDL: 34 mg/dL — ABNORMAL LOW (ref >=50)
LDL Cholesterol (Calc): 121 mg/dL (calc) — ABNORMAL HIGH
Non-HDL Cholesterol (Calc): 146 mg/dL (calc) — ABNORMAL HIGH (ref <130)
Total CHOL/HDL Ratio: 5.3 (calc) — ABNORMAL HIGH (ref <5.0)
Triglycerides: 142 mg/dL (ref <150)

## 2018-09-14 LAB — COMPREHENSIVE METABOLIC PANEL
AG Ratio: 1.4 (calc) (ref 1.0–2.5)
ALT: 13 U/L (ref 6–29)
AST: 20 U/L (ref 10–35)
Albumin: 4.2 g/dL (ref 3.6–5.1)
Alkaline phosphatase (APISO): 59 U/L (ref 37–153)
BUN/Creatinine Ratio: 18 (calc) (ref 6–22)
BUN: 31 mg/dL — ABNORMAL HIGH (ref 7–25)
CO2: 22 mmol/L (ref 20–32)
Calcium: 9.7 mg/dL (ref 8.6–10.4)
Chloride: 106 mmol/L (ref 98–110)
Creat: 1.72 mg/dL — ABNORMAL HIGH (ref 0.60–0.93)
Globulin: 3.1 g/dL (calc) (ref 1.9–3.7)
Glucose, Bld: 102 mg/dL — ABNORMAL HIGH (ref 65–99)
Potassium: 4.7 mmol/L (ref 3.5–5.3)
Sodium: 140 mmol/L (ref 135–146)
Total Bilirubin: 0.3 mg/dL (ref 0.2–1.2)
Total Protein: 7.3 g/dL (ref 6.1–8.1)

## 2018-09-15 NOTE — Assessment & Plan Note (Signed)
Continue statin drug, goal LDL less than 100 with other comorbidites

## 2018-09-15 NOTE — Assessment & Plan Note (Signed)
Controlled no changes, fasting labs today

## 2018-09-19 ENCOUNTER — Other Ambulatory Visit: Payer: Self-pay | Admitting: *Deleted

## 2018-09-19 DIAGNOSIS — N289 Disorder of kidney and ureter, unspecified: Secondary | ICD-10-CM

## 2018-09-19 MED ORDER — LISINOPRIL-HYDROCHLOROTHIAZIDE 20-25 MG PO TABS: 0.5000 | ORAL_TABLET | Freq: Every day | ORAL | 2 refills | Status: DC

## 2018-10-01 ENCOUNTER — Other Ambulatory Visit: Payer: Medicare Other

## 2018-10-01 ENCOUNTER — Other Ambulatory Visit: Payer: Self-pay

## 2018-10-01 ENCOUNTER — Ambulatory Visit (INDEPENDENT_AMBULATORY_CARE_PROVIDER_SITE_OTHER): Payer: Medicare Other

## 2018-10-01 DIAGNOSIS — Z23 Encounter for immunization: Secondary | ICD-10-CM | POA: Diagnosis not present

## 2018-10-01 DIAGNOSIS — N289 Disorder of kidney and ureter, unspecified: Secondary | ICD-10-CM

## 2018-10-01 NOTE — Progress Notes (Signed)
Patient came in to receive her annual flu shot. Patient was given fluad 65+ high dose flu. Flu vaccine was given in left deltoid. Patient tolerated well. VIS given

## 2018-10-02 LAB — BASIC METABOLIC PANEL WITH GFR
BUN/Creatinine Ratio: 18 (calc) (ref 6–22)
BUN: 26 mg/dL — ABNORMAL HIGH (ref 7–25)
CO2: 23 mmol/L (ref 20–32)
Calcium: 9.8 mg/dL (ref 8.6–10.4)
Chloride: 106 mmol/L (ref 98–110)
Creat: 1.46 mg/dL — ABNORMAL HIGH (ref 0.60–0.93)
GFR, Est African American: 42 mL/min/{1.73_m2} — ABNORMAL LOW (ref >=60)
GFR, Est Non African American: 36 mL/min/{1.73_m2} — ABNORMAL LOW (ref >=60)
Glucose, Bld: 109 mg/dL — ABNORMAL HIGH (ref 65–99)
Potassium: 4.5 mmol/L (ref 3.5–5.3)
Sodium: 138 mmol/L (ref 135–146)

## 2018-10-10 ENCOUNTER — Other Ambulatory Visit: Payer: Self-pay | Admitting: Family Medicine

## 2019-03-09 ENCOUNTER — Ambulatory Visit: Payer: Medicare Other

## 2019-03-17 ENCOUNTER — Encounter: Payer: Medicare Other | Admitting: Family Medicine

## 2019-04-09 ENCOUNTER — Other Ambulatory Visit: Payer: Self-pay | Admitting: Family Medicine

## 2019-04-14 ENCOUNTER — Other Ambulatory Visit: Payer: Self-pay

## 2019-04-14 ENCOUNTER — Encounter: Payer: Self-pay | Admitting: Orthopedic Surgery

## 2019-04-14 ENCOUNTER — Ambulatory Visit: Payer: Medicare PPO | Admitting: Orthopedic Surgery

## 2019-04-14 ENCOUNTER — Ambulatory Visit: Payer: Medicare PPO

## 2019-04-14 VITALS — BP 127/68 | HR 77 | Ht 66.0 in | Wt 192.0 lb

## 2019-04-14 DIAGNOSIS — M1711 Unilateral primary osteoarthritis, right knee: Secondary | ICD-10-CM

## 2019-04-14 DIAGNOSIS — Z96651 Presence of right artificial knee joint: Secondary | ICD-10-CM

## 2019-04-14 NOTE — Progress Notes (Signed)
Chief Complaint  Patient presents with  . Routine Post Op    04/09/18 right total knee replacement    69 years old 1 year after total knee replacement patient says she is doing well pain is controlled her function is excellent she is walking independently  Her x-ray shows normal alignment in the knee without loosening  Clinical exam shows full extension of the right knee with 115 degrees of knee flexion there is no instability in the sagittal or coronal plane  Encounter Diagnoses  Name Primary?  . S/P total knee replacement, right 04/09/18 Yes  . Primary osteoarthritis of right knee     Recommend 1 year follow-up

## 2019-06-02 ENCOUNTER — Encounter: Payer: Medicare PPO | Admitting: Family Medicine

## 2019-06-02 ENCOUNTER — Ambulatory Visit (INDEPENDENT_AMBULATORY_CARE_PROVIDER_SITE_OTHER): Payer: Medicare PPO | Admitting: Family Medicine

## 2019-06-02 ENCOUNTER — Encounter: Payer: Self-pay | Admitting: Family Medicine

## 2019-06-02 ENCOUNTER — Other Ambulatory Visit: Payer: Self-pay

## 2019-06-02 VITALS — BP 130/76 | HR 80 | Temp 98.7°F | Resp 14 | Ht 66.0 in | Wt 197.0 lb

## 2019-06-02 DIAGNOSIS — E7800 Pure hypercholesterolemia, unspecified: Secondary | ICD-10-CM | POA: Diagnosis not present

## 2019-06-02 DIAGNOSIS — N182 Chronic kidney disease, stage 2 (mild): Secondary | ICD-10-CM | POA: Diagnosis not present

## 2019-06-02 DIAGNOSIS — Z0001 Encounter for general adult medical examination with abnormal findings: Secondary | ICD-10-CM

## 2019-06-02 DIAGNOSIS — E6609 Other obesity due to excess calories: Secondary | ICD-10-CM | POA: Diagnosis not present

## 2019-06-02 DIAGNOSIS — Z6831 Body mass index (BMI) 31.0-31.9, adult: Secondary | ICD-10-CM | POA: Diagnosis not present

## 2019-06-02 DIAGNOSIS — I10 Essential (primary) hypertension: Secondary | ICD-10-CM

## 2019-06-02 DIAGNOSIS — L509 Urticaria, unspecified: Secondary | ICD-10-CM

## 2019-06-02 DIAGNOSIS — Z0000 Encounter for general adult medical examination without abnormal findings: Secondary | ICD-10-CM

## 2019-06-02 DIAGNOSIS — I6523 Occlusion and stenosis of bilateral carotid arteries: Secondary | ICD-10-CM

## 2019-06-02 MED ORDER — PREDNISONE 10 MG PO TABS: ORAL_TABLET | ORAL | 0 refills | Status: DC

## 2019-06-02 MED ORDER — HYDROXYZINE HCL 10 MG PO TABS: 10.0000 mg | ORAL_TABLET | Freq: Three times a day (TID) | ORAL | 0 refills | Status: DC | PRN

## 2019-06-02 NOTE — Progress Notes (Signed)
Subjective:   Patient presents for Medicare Annual/Subsequent preventive examination.  OA- doing wel since her knee surgery , she has been released from Dr. Aline Brochure does not have any pain at that time    HTN- taking bp meds without any difficulty,  taking 1/2 tablet of the lisinopril HCTZ    Carotid artery disease- taking zocor    Every year breaks out in hives with the pollen. Has been there a few weeks. Has around her chest, arms, very itchy, she typically uses benadryl but it did not work this type and she has been scratching a lot     Review Past Medical/Family/Social: per EMR   Risk Factors  Current exercise habits: walks some  Dietary issues discussed: Yes  Cardiac risk factors: Obesity (BMI >= 30 kg/m2). , HTN, CAD, Hyperlipidemia  Depression Screen  (Note: if answer to either of the following is "Yes", a more complete depression screening is indicated)  Over the past two weeks, have you felt down, depressed or hopeless? No Over the past two weeks, have you felt little interest or pleasure in doing things? No Have you lost interest or pleasure in daily life? No Do you often feel hopeless? No Do you cry easily over simple problems? No   Activities of Daily Living  In your present state of health, do you have any difficulty performing the following activities?:  Driving? No  Managing money? No  Feeding yourself? No  Getting from bed to chair? No  Climbing a flight of stairs? yes Preparing food and eating?: No  Bathing or showering? No  Getting dressed: No  Getting to the toilet? No  Using the toilet:No  Moving around from place to place: No  In the past year have you fallen or had a near fall?:No  Are you sexually active? No  Do you have more than one partner? No  No urinary problems or incontinence   Hearing Difficulties: No  Do you often ask people to speak up or repeat themselves? No  Do you experience ringing or noises in your ears? No Do you have  difficulty understanding soft or whispered voices? No  Do you feel that you have a problem with memory? No Do you often misplace items? No  Do you feel safe at home? Yes  Cognitive Testing  Alert? Yes Normal Appearance?Yes  Oriented to person? Yes Place? Yes  Time? Yes  Recall of three objects? Yes  Can perform simple calculations? Yes  Displays appropriate judgment?Yes  Can read the correct time from a watch face?Yes   List the Names of Other Physician/Practitioners you currently use:  Dr. Aline Brochure- Orthopedics Eye doctor- My Eye doctor in [place] - told small cataract   Screening Tests / Date ColonoscopyUTD Shingrix- UTD MammogramUTD Bone Density- UTD, normal  2018  Pneumonia- UTD Tetanus/tdapUTD COVID-19  UTD   ROS: GEN-Denies fatigue,deniesfever, weight loss,weakness, recent illness HEENT- denies eye drainage, change in vision, nasal discharge, CVS- denies chest pain, palpitations RESP- denies SOB, cough, wheeze ABD- denies N/V, change in stools, abd pain GU- denies dysuria, hematuria, dribbling, incontinence MSK- + joint pain, muscle aches, injury Neuro- denies headache, dizziness, syncope, seizure activity  Physical:, vitals reviewed  GEN- NAD, alert and oriented x3 HEENT- PERRL, EOMI, non injected sclera, pink conjunctiva, MMM, oropharynx clear, tm mild wax left side, no impaction, hearing grossly in tact Neck- Supple, no thryomegaly CVS- RRR, no murmur RESP-CTAB ABD-NABS,soft,NT,ND Skin- urticaria across chest, upper arms, neck, + exocoriations  EXT-noedema Pulses- Radial, DP-  2+    Assessment:    Annual wellness medicare exam   Plan:    During the course of the visit the patient was educated and counseled about appropriate screening and preventive services including:   Fall/AuditC/depression screen negative   HTN- well controlled, check renal function  Hyperlipidemia/ carotid artery disease- taking  statin drug, in setting of COVID -19 and stability of [place] over the past 4 years, will repeat in 2022   Bone Density normal 2018 on calcium and vitamin D   Mammogram- pt to schedule in  2022, will do every 2 years   Jerrye Bushy- continue nexium as needed, continues to have benefit from this  HIVES-  2/2 change in weather pollen,pt typically able to self treat with benadryl but this is not working , will give Depo Medrol 40mg  IM in office, prednisone taper, atarax prn   Discussed advanced directives, given handout she will review with her kids , FULL CODE   Medicare Attestation  I have personally reviewed:  The patient's medical and social history  Their use of alcohol, tobacco or illicit drugs  Their current medications and supplements  The patient's functional ability including ADLs,fall risks, home safety risks, cognitive, and hearing and visual impairment  Diet and physical activities  Evidence for depression or mood disorders  The patient's weight, height, BMI, and visual acuity have been recorded in the chart. I have made referrals, counseling, and provided education to the patient based on review of the above and I have provided the patient with a written personalized care plan for preventive services.

## 2019-06-02 NOTE — Patient Instructions (Signed)
Carotid ultrasound and mammogram will be done in 2022 We will call with results Start prednisone tablets in the morning  hydroxyzine itching pill as needed F/U 6 months

## 2019-06-03 LAB — COMPREHENSIVE METABOLIC PANEL
AG Ratio: 1.3 (calc) (ref 1.0–2.5)
ALT: 17 U/L (ref 6–29)
AST: 20 U/L (ref 10–35)
Albumin: 4.1 g/dL (ref 3.6–5.1)
Alkaline phosphatase (APISO): 66 U/L (ref 37–153)
BUN/Creatinine Ratio: 15 (calc) (ref 6–22)
BUN: 22 mg/dL (ref 7–25)
CO2: 24 mmol/L (ref 20–32)
Calcium: 9.9 mg/dL (ref 8.6–10.4)
Chloride: 103 mmol/L (ref 98–110)
Creat: 1.48 mg/dL — ABNORMAL HIGH (ref 0.60–0.93)
Globulin: 3.1 g/dL (calc) (ref 1.9–3.7)
Glucose, Bld: 90 mg/dL (ref 65–99)
Potassium: 4.8 mmol/L (ref 3.5–5.3)
Sodium: 138 mmol/L (ref 135–146)
Total Bilirubin: 0.4 mg/dL (ref 0.2–1.2)
Total Protein: 7.2 g/dL (ref 6.1–8.1)

## 2019-06-03 LAB — CBC WITH DIFFERENTIAL/PLATELET
Absolute Monocytes: 921 cells/uL (ref 200–950)
Basophils Absolute: 30 cells/uL (ref 0–200)
Basophils Relative: 0.3 %
Eosinophils Absolute: 208 cells/uL (ref 15–500)
Eosinophils Relative: 2.1 %
HCT: 37.7 % (ref 35.0–45.0)
Hemoglobin: 12.6 g/dL (ref 11.7–15.5)
Lymphs Abs: 1841 cells/uL (ref 850–3900)
MCH: 29 pg (ref 27.0–33.0)
MCHC: 33.4 g/dL (ref 32.0–36.0)
MCV: 86.9 fL (ref 80.0–100.0)
MPV: 11.8 fL (ref 7.5–12.5)
Monocytes Relative: 9.3 %
Neutro Abs: 6900 cells/uL (ref 1500–7800)
Neutrophils Relative %: 69.7 %
Platelets: 293 10*3/uL (ref 140–400)
RBC: 4.34 10*6/uL (ref 3.80–5.10)
RDW: 12.6 % (ref 11.0–15.0)
Total Lymphocyte: 18.6 %
WBC: 9.9 10*3/uL (ref 3.8–10.8)

## 2019-06-03 LAB — LIPID PANEL
Cholesterol: 147 mg/dL (ref <200)
HDL: 34 mg/dL — ABNORMAL LOW (ref >=50)
LDL Cholesterol (Calc): 89 mg/dL (calc)
Non-HDL Cholesterol (Calc): 113 mg/dL (calc) (ref <130)
Total CHOL/HDL Ratio: 4.3 (calc) (ref <5.0)
Triglycerides: 137 mg/dL (ref <150)

## 2019-06-06 ENCOUNTER — Encounter: Payer: Self-pay | Admitting: *Deleted

## 2019-08-27 ENCOUNTER — Other Ambulatory Visit: Payer: Self-pay | Admitting: Family Medicine

## 2019-10-21 DIAGNOSIS — H43813 Vitreous degeneration, bilateral: Secondary | ICD-10-CM | POA: Diagnosis not present

## 2019-11-20 ENCOUNTER — Other Ambulatory Visit: Payer: Self-pay | Admitting: Family Medicine

## 2019-12-05 ENCOUNTER — Ambulatory Visit: Payer: Medicare PPO | Admitting: Family Medicine

## 2019-12-22 ENCOUNTER — Ambulatory Visit (INDEPENDENT_AMBULATORY_CARE_PROVIDER_SITE_OTHER): Payer: Medicare PPO | Admitting: Family Medicine

## 2019-12-22 ENCOUNTER — Other Ambulatory Visit: Payer: Self-pay

## 2019-12-22 ENCOUNTER — Encounter: Payer: Self-pay | Admitting: Family Medicine

## 2019-12-22 VITALS — BP 136/70 | HR 66 | Temp 98.5°F | Resp 16 | Ht 66.0 in | Wt 198.0 lb

## 2019-12-22 DIAGNOSIS — E6609 Other obesity due to excess calories: Secondary | ICD-10-CM

## 2019-12-22 DIAGNOSIS — I10 Essential (primary) hypertension: Secondary | ICD-10-CM | POA: Diagnosis not present

## 2019-12-22 DIAGNOSIS — K219 Gastro-esophageal reflux disease without esophagitis: Secondary | ICD-10-CM

## 2019-12-22 DIAGNOSIS — Z23 Encounter for immunization: Secondary | ICD-10-CM | POA: Diagnosis not present

## 2019-12-22 DIAGNOSIS — G4700 Insomnia, unspecified: Secondary | ICD-10-CM | POA: Insufficient documentation

## 2019-12-22 DIAGNOSIS — F5101 Primary insomnia: Secondary | ICD-10-CM

## 2019-12-22 DIAGNOSIS — Z6831 Body mass index (BMI) 31.0-31.9, adult: Secondary | ICD-10-CM

## 2019-12-22 DIAGNOSIS — E7800 Pure hypercholesterolemia, unspecified: Secondary | ICD-10-CM

## 2019-12-22 DIAGNOSIS — N182 Chronic kidney disease, stage 2 (mild): Secondary | ICD-10-CM

## 2019-12-22 MED ORDER — TRAZODONE HCL 50 MG PO TABS
25.0000 mg | ORAL_TABLET | Freq: Every evening | ORAL | 3 refills | Status: DC | PRN
Start: 2019-12-22 — End: 2021-06-13

## 2019-12-22 NOTE — Assessment & Plan Note (Signed)
Continues on nexium prn

## 2019-12-22 NOTE — Assessment & Plan Note (Signed)
Recheck renal function. ?

## 2019-12-22 NOTE — Assessment & Plan Note (Signed)
Blood pressure controlled no changes to meds Check renal function

## 2019-12-22 NOTE — Progress Notes (Signed)
   Subjective:    Patient ID: Robin Villa, female    DOB: 10-May-1950, 69 y.o.   MRN: [id number]  Patient presents for Follow-up (is fasting)  Here to follow-up chronic medical problems. Medications reviewed.  Her last LDL was at goal at 89, 6 months ago he is on statin drug without any side effects  Chronic kidney disease last creatinine 1.48 which is her baseline  HTN-  she is taking her lisinopril HCTZ without any difficulty no chest pain no shortness of breath she is also on aspirin 81 mg   She has difficulty sleeping often up and late at night ,she cant fall asleep thinking about things, often doesn't fall asleep until after 1am. She doesn't feel very fatigued during the day  She has tried melatonin but this has not helped    Review Of Systems:  GEN- denies fatigue, fever, weight loss,weakness, recent illness HEENT- denies eye drainage, change in vision, nasal discharge, CVS- denies chest pain, palpitations RESP- denies SOB, cough, wheeze ABD- denies N/V, change in stools, abd pain GU- denies dysuria, hematuria, dribbling, incontinence MSK- denies joint pain, muscle aches, injury Neuro- denies headache, dizziness, syncope, seizure activity       Objective:    BP 136/70   Pulse 66   Temp 98.5 F (36.9 C) (Temporal)   Resp 16   Ht 5\' 6"  (1.676 m)   Wt 198 lb (89.8 kg)   SpO2 98%   BMI 31.96 kg/m  GEN- NAD, alert and oriented x3 HEENT- PERRL, EOMI, non injected sclera, pink conjunctiva, MMM, oropharynx clear Neck- Supple, no thyromegaly CVS- RRR, no murmur RESP-CTAB ABD-NABS,soft,NT,ND Psych normal affect and mood  EXT- Trace ankle  edema Pulses- Radial, DP- 2+        Assessment & Plan:      Problem List Items Addressed This Visit      Unprioritized   CKD (chronic kidney disease), stage II - Primary    Recheck renal function       Relevant Orders   CBC with Differential/Platelet   Comprehensive metabolic panel   Essential hypertension, benign     Blood pressure controlled no changes to meds Check renal function       GERD (gastroesophageal reflux disease)    Continues on nexium prn       Hyperlipidemia    Continue statin drug       Relevant Orders   Lipid panel   Insomnia    Trial of trazodone start 25mg  1 hour before bedtime, can increase to full tablet in 1 week if needed       Obesity    Other Visit Diagnoses    Need for immunization against influenza       Relevant Orders   Flu Vaccine QUAD High Dose(Fluad) (Completed)      Note: This dictation was prepared with Dragon dictation along with smaller phrase technology. Any transcriptional errors that result from this process are unintentional.

## 2019-12-22 NOTE — Assessment & Plan Note (Signed)
Trial of trazodone start 25mg  1 hour before bedtime, can increase to full tablet in 1 week if needed

## 2019-12-22 NOTE — Assessment & Plan Note (Signed)
Continue statin drug   

## 2019-12-23 LAB — CBC WITH DIFFERENTIAL/PLATELET
Absolute Monocytes: 722 cells/uL (ref 200–950)
Basophils Absolute: 50 cells/uL (ref 0–200)
Basophils Relative: 0.6 %
Eosinophils Absolute: 249 cells/uL (ref 15–500)
Eosinophils Relative: 3 %
HCT: 35.3 % (ref 35.0–45.0)
Hemoglobin: 11.7 g/dL (ref 11.7–15.5)
Lymphs Abs: 1129 cells/uL (ref 850–3900)
MCH: 29.1 pg (ref 27.0–33.0)
MCHC: 33.1 g/dL (ref 32.0–36.0)
MCV: 87.8 fL (ref 80.0–100.0)
MPV: 12 fL (ref 7.5–12.5)
Monocytes Relative: 8.7 %
Neutro Abs: 6150 cells/uL (ref 1500–7800)
Neutrophils Relative %: 74.1 %
Platelets: 257 10*3/uL (ref 140–400)
RBC: 4.02 10*6/uL (ref 3.80–5.10)
RDW: 12.8 % (ref 11.0–15.0)
Total Lymphocyte: 13.6 %
WBC: 8.3 10*3/uL (ref 3.8–10.8)

## 2019-12-23 LAB — COMPREHENSIVE METABOLIC PANEL
AG Ratio: 1.4 (calc) (ref 1.0–2.5)
ALT: 14 U/L (ref 6–29)
AST: 19 U/L (ref 10–35)
Albumin: 4.2 g/dL (ref 3.6–5.1)
Alkaline phosphatase (APISO): 57 U/L (ref 37–153)
BUN/Creatinine Ratio: 16 (calc) (ref 6–22)
BUN: 27 mg/dL — ABNORMAL HIGH (ref 7–25)
CO2: 23 mmol/L (ref 20–32)
Calcium: 9.7 mg/dL (ref 8.6–10.4)
Chloride: 107 mmol/L (ref 98–110)
Creat: 1.7 mg/dL — ABNORMAL HIGH (ref 0.60–0.93)
Globulin: 3 g/dL (calc) (ref 1.9–3.7)
Glucose, Bld: 100 mg/dL — ABNORMAL HIGH (ref 65–99)
Potassium: 4.8 mmol/L (ref 3.5–5.3)
Sodium: 140 mmol/L (ref 135–146)
Total Bilirubin: 0.4 mg/dL (ref 0.2–1.2)
Total Protein: 7.2 g/dL (ref 6.1–8.1)

## 2019-12-23 LAB — LIPID PANEL
Cholesterol: 161 mg/dL (ref <200)
HDL: 35 mg/dL — ABNORMAL LOW (ref >=50)
LDL Cholesterol (Calc): 99 mg/dL (calc)
Non-HDL Cholesterol (Calc): 126 mg/dL (calc) (ref <130)
Total CHOL/HDL Ratio: 4.6 (calc) (ref <5.0)
Triglycerides: 169 mg/dL — ABNORMAL HIGH (ref <150)

## 2020-01-09 ENCOUNTER — Other Ambulatory Visit: Payer: Self-pay

## 2020-01-09 DIAGNOSIS — I10 Essential (primary) hypertension: Secondary | ICD-10-CM

## 2020-01-09 MED ORDER — AMLODIPINE BESYLATE 10 MG PO TABS: 10.0000 mg | ORAL_TABLET | Freq: Every day | ORAL | 3 refills | Status: DC

## 2020-01-09 NOTE — Progress Notes (Signed)
Called pt, left message for call back of lab result. New medication has been sent to the pharmacy listed in pt chart as instructed, discontinued the lisinopril. Future lab order for bmp has been placed

## 2020-01-26 ENCOUNTER — Other Ambulatory Visit: Payer: Self-pay

## 2020-01-26 ENCOUNTER — Telehealth: Payer: Self-pay | Admitting: *Deleted

## 2020-01-26 ENCOUNTER — Other Ambulatory Visit (INDEPENDENT_AMBULATORY_CARE_PROVIDER_SITE_OTHER): Payer: Medicare PPO

## 2020-01-26 DIAGNOSIS — N182 Chronic kidney disease, stage 2 (mild): Secondary | ICD-10-CM | POA: Diagnosis not present

## 2020-01-26 DIAGNOSIS — I10 Essential (primary) hypertension: Secondary | ICD-10-CM | POA: Diagnosis not present

## 2020-01-26 LAB — BASIC METABOLIC PANEL
BUN/Creatinine Ratio: 16 (calc) (ref 6–22)
BUN: 21 mg/dL (ref 7–25)
CO2: 23 mmol/L (ref 20–32)
Calcium: 9.2 mg/dL (ref 8.6–10.4)
Chloride: 110 mmol/L (ref 98–110)
Creat: 1.3 mg/dL — ABNORMAL HIGH (ref 0.60–0.93)
Glucose, Bld: 93 mg/dL (ref 65–99)
Potassium: 4.5 mmol/L (ref 3.5–5.3)
Sodium: 142 mmol/L (ref 135–146)

## 2020-01-26 MED ORDER — AMLODIPINE BESYLATE 10 MG PO TABS: 5.0000 mg | ORAL_TABLET | Freq: Every day | ORAL | 3 refills | Status: DC

## 2020-01-26 MED ORDER — FUROSEMIDE 40 MG PO TABS: 40.0000 mg | ORAL_TABLET | Freq: Every day | ORAL | 3 refills | Status: DC

## 2020-01-26 NOTE — Telephone Encounter (Signed)
Patient denied chest pain, SOB. Observed patient and no facial edema noted.   Call placed to patient and patient made aware to decrease amlodipine. Prescription sent to pharmacy.

## 2020-01-26 NOTE — Telephone Encounter (Signed)
Patient in office for repeat BMP after stopping Lisinopril/ HCTZ.   States that BP has been WNL (130/ 80's), but she has increased edema in BLE and hands. Noted 3+ pitting edema in B ankles. States that edema will worsen throughout the day and improve at night with rest/ elevation. Noted 1+ nonpitting edema to hands.   Please advise.

## 2020-01-26 NOTE — Telephone Encounter (Signed)
iF NO sob, Chest pain   Start lasix 40mg  once a day , needs OV in 1 week recheck on fluid  Reduce norvasc to 1/2 tablet ( 5mg )

## 2020-02-03 ENCOUNTER — Other Ambulatory Visit: Payer: Self-pay

## 2020-02-03 ENCOUNTER — Encounter: Payer: Self-pay | Admitting: Family Medicine

## 2020-02-03 ENCOUNTER — Ambulatory Visit (INDEPENDENT_AMBULATORY_CARE_PROVIDER_SITE_OTHER): Payer: Medicare PPO | Admitting: Family Medicine

## 2020-02-03 VITALS — BP 124/62 | HR 80 | Temp 97.9°F | Resp 14 | Ht 66.0 in | Wt 193.0 lb

## 2020-02-03 DIAGNOSIS — R0981 Nasal congestion: Secondary | ICD-10-CM | POA: Diagnosis not present

## 2020-02-03 DIAGNOSIS — N182 Chronic kidney disease, stage 2 (mild): Secondary | ICD-10-CM | POA: Diagnosis not present

## 2020-02-03 DIAGNOSIS — I10 Essential (primary) hypertension: Secondary | ICD-10-CM | POA: Diagnosis not present

## 2020-02-03 LAB — BASIC METABOLIC PANEL
BUN/Creatinine Ratio: 13 (calc) (ref 6–22)
BUN: 20 mg/dL (ref 7–25)
CO2: 24 mmol/L (ref 20–32)
Calcium: 9.6 mg/dL (ref 8.6–10.4)
Chloride: 108 mmol/L (ref 98–110)
Creat: 1.52 mg/dL — ABNORMAL HIGH (ref 0.60–0.93)
Glucose, Bld: 98 mg/dL (ref 65–99)
Potassium: 4.3 mmol/L (ref 3.5–5.3)
Sodium: 142 mmol/L (ref 135–146)

## 2020-02-03 NOTE — Assessment & Plan Note (Signed)
Blood pressures controlled.  Continue amlodipine 5 mg.  We will try to decrease her Lasix to 20 mg once a day and see how her fluid retention does.  We will recheck her renal function today.  Renal mild nasal congestion no overt sign of illness.  He declines however she has worsening symptoms with cough congestion fever she should get Covid testing.  For now can use Mucinex nasal saline allergy tablet

## 2020-02-03 NOTE — Progress Notes (Signed)
   Subjective:    Patient ID: Robin Villa, female    DOB: 05-12-1950, 70 y.o.   MRN: [id number]  Patient presents for Follow-up (Edema much improved with lasix/) and Illness (X3 days- Head cold worse at night)  Patient here for interim follow-up on swelling of blood pressure.  Patient or patient was in the office to have metabolic panel redrawn secondary to acute on chronic renal failure.  I stopped her lisinopril HCTZ.  Over a couple of weeks she noted an increase in swelling in her hands and feet and abdomen.  I reduce her amlodipine to 1/2 tablet 5 mg daily she was started on Lasix 40 mg daily.  The swelling has gone down significantly her weight is down 5 pounds states that she feels much better.  Her creatinine 1 week ago was down to 1.3 which is her baseline   Nasal congestion, no cough, no fever, initially had sore throat but only lasted a day  This has been going on for the past 3 days  no known sick contact  She had has COVID vcines  No body achy aches She has been using a nasal saline/zyrtec         Review Of Systems:  GEN- denies fatigue, fever, weight loss,weakness, recent illness HEENT- denies eye drainage, change in vision, nasal discharge, CVS- denies chest pain, palpitations RESP- denies SOB, cough, wheeze ABD- denies N/V, change in stools, abd pain GU- denies dysuria, hematuria, dribbling, incontinence MSK- denies joint pain, muscle aches, injury Neuro- denies headache, dizziness, syncope, seizure activity       Objective:    BP 124/62   Pulse 80   Temp 97.9 F (36.6 C) (Temporal)   Resp 14   Ht 5\' 6"  (1.676 m)   Wt 193 lb (87.5 kg)   SpO2 100%   BMI 31.15 kg/m  GEN- NAD, alert and oriented x3 HEENT- PERRL, EOMI, non injected sclera, pink conjunctiva, MMM, oropharynx clear nares clear rhinorrhea no sinus tenderness TM clear no effusion Neck- Supple, no thyromegaly, no lymphadenopathy CVS- RRR, no murmur RESP-CTAB ABD-NABS,soft,NT,ND EXT-trace ankle  edema Pulses- Radial, DP- 2+        Assessment & Plan:      Problem List Items Addressed This Visit      Unprioritized   CKD (chronic kidney disease), stage II   Relevant Orders   Basic metabolic panel   Essential hypertension, benign    Blood pressures controlled.  Continue amlodipine 5 mg.  We will try to decrease her Lasix to 20 mg once a day and see how her fluid retention does.  We will recheck her renal function today.  Renal mild nasal congestion no overt sign of illness.  He declines however she has worsening symptoms with cough congestion fever she should get Covid testing.  For now can use Mucinex nasal saline allergy tablet      Relevant Orders   Basic metabolic panel    Other Visit Diagnoses    Nasal congestion    -  Primary      Note: This dictation was prepared with Dragon dictation along with smaller phrase technology. Any transcriptional errors that result from this process are unintentional.

## 2020-02-03 NOTE — Patient Instructions (Addendum)
Continue norvasc 5mg   Decrease lasix to 20mg  ( 1/2 tablet)  F/U 3 months

## 2020-04-12 ENCOUNTER — Ambulatory Visit: Payer: Medicare PPO | Admitting: Orthopedic Surgery

## 2020-04-12 DIAGNOSIS — Z96651 Presence of right artificial knee joint: Secondary | ICD-10-CM

## 2020-04-14 ENCOUNTER — Other Ambulatory Visit: Payer: Self-pay | Admitting: *Deleted

## 2020-04-14 MED ORDER — SIMVASTATIN 20 MG PO TABS: 20.0000 mg | ORAL_TABLET | Freq: Every day | ORAL | 0 refills | Status: DC

## 2020-04-26 ENCOUNTER — Ambulatory Visit: Payer: Medicare PPO | Admitting: Orthopedic Surgery

## 2020-04-26 ENCOUNTER — Ambulatory Visit: Payer: Medicare PPO

## 2020-04-26 ENCOUNTER — Other Ambulatory Visit: Payer: Self-pay

## 2020-04-26 ENCOUNTER — Encounter: Payer: Self-pay | Admitting: Orthopedic Surgery

## 2020-04-26 VITALS — BP 166/82 | HR 82 | Ht 65.0 in | Wt 201.0 lb

## 2020-04-26 DIAGNOSIS — M1710 Unilateral primary osteoarthritis, unspecified knee: Secondary | ICD-10-CM

## 2020-04-26 DIAGNOSIS — Z96651 Presence of right artificial knee joint: Secondary | ICD-10-CM

## 2020-04-26 DIAGNOSIS — M1711 Unilateral primary osteoarthritis, right knee: Secondary | ICD-10-CM | POA: Diagnosis not present

## 2020-04-26 NOTE — Progress Notes (Signed)
Chief Complaint  Patient presents with  . Knee Pain    R/doing good just here for yearly checkup.    Status post right total knee done in 2020  She is doing well she says is the best thing she is ever done she is not having any pain  X-rays show stable prosthesis with alignment in all planes within normal parameters  Clinical exam shows she is walking without support there are no signs of infection she has range of motion of 3 to 115 degrees with a stable knee in all planes  Evaluate with x-ray again in a year  Chronic problem stable internal images low risk of any problems

## 2020-05-03 ENCOUNTER — Ambulatory Visit: Payer: Medicare PPO | Admitting: Family Medicine

## 2020-06-02 ENCOUNTER — Ambulatory Visit: Payer: Medicare PPO | Admitting: Internal Medicine

## 2020-06-02 ENCOUNTER — Other Ambulatory Visit: Payer: Self-pay

## 2020-06-02 ENCOUNTER — Encounter: Payer: Self-pay | Admitting: Internal Medicine

## 2020-06-02 VITALS — BP 160/80 | HR 83 | Resp 18 | Ht 66.0 in | Wt 199.4 lb

## 2020-06-02 DIAGNOSIS — Z7689 Persons encountering health services in other specified circumstances: Secondary | ICD-10-CM | POA: Diagnosis not present

## 2020-06-02 DIAGNOSIS — I10 Essential (primary) hypertension: Secondary | ICD-10-CM | POA: Diagnosis not present

## 2020-06-02 DIAGNOSIS — N1832 Chronic kidney disease, stage 3b: Secondary | ICD-10-CM

## 2020-06-02 DIAGNOSIS — M170 Bilateral primary osteoarthritis of knee: Secondary | ICD-10-CM | POA: Diagnosis not present

## 2020-06-02 DIAGNOSIS — R600 Localized edema: Secondary | ICD-10-CM

## 2020-06-02 DIAGNOSIS — F5101 Primary insomnia: Secondary | ICD-10-CM

## 2020-06-02 DIAGNOSIS — E7800 Pure hypercholesterolemia, unspecified: Secondary | ICD-10-CM

## 2020-06-02 DIAGNOSIS — K219 Gastro-esophageal reflux disease without esophagitis: Secondary | ICD-10-CM

## 2020-06-02 MED ORDER — TELMISARTAN 20 MG PO TABS
20.0000 mg | ORAL_TABLET | Freq: Every day | ORAL | 1 refills | Status: DC
Start: 2020-06-02 — End: 2020-07-07

## 2020-06-02 MED ORDER — AMLODIPINE BESYLATE 5 MG PO TABS: 5.0000 mg | ORAL_TABLET | Freq: Every day | ORAL | 1 refills | Status: DC

## 2020-06-02 MED ORDER — FUROSEMIDE 20 MG PO TABS: 20.0000 mg | ORAL_TABLET | Freq: Every day | ORAL | 5 refills | Status: DC

## 2020-06-02 NOTE — Progress Notes (Signed)
New Patient Office Visit  Subjective:  Patient ID: Robin Villa, female    DOB: 26-Jun-1950  Age: 70 y.o. MRN: [id number]  CC:  Chief Complaint  Patient presents with  . New Patient (Initial Visit)    New patient just establishing care     HPI Robin Villa is a 70 year old female with PMh of HTN, CKD stage 3, GERD, OA of knee and insomnia who presents for establishing care. She is a former patient of Dr Buelah Manis.  She has been doing well overall.  Her BP was elevated in the office today. She has been taking Amlodipine 5 mg QD. She did not tolerate 10 mg dosing (leg edema). She has underling CKD, has not seen Nephrologist yet. She denies any headache, dizziness, chest pain, dyspnea or palpitations.  She takes Nexium for GERD. Denies dysphagia or odynophagia.  She has had right TKA for knee OA. Does not use any walking support. She is independent with her ADLs and IADLs.  She has had 2 doses of COVID vaccine. She has had Pneumococcal vaccine as well.  Past Medical History:  Diagnosis Date  . Arthritis   . Heart murmur    asymptomatic  . Hypercholesteremia   . Hypertension     Past Surgical History:  Procedure Laterality Date  . CARPAL TUNNEL RELEASE     bilateral   . CATARACT EXTRACTION W/PHACO Right 09/07/2014   Procedure: CATARACT EXTRACTION PHACO AND INTRAOCULAR LENS PLACEMENT RIGHT EYE CDE=6.10;  Surgeon: Tonny Branch, MD;  Location: [facility];  Service: Ophthalmology;  Laterality: Right;  . COLONOSCOPY  09/15/2011   Procedure: COLONOSCOPY;  Surgeon: Danie Binder, MD;  Location: [facility];  Service: Endoscopy;  Laterality: N/A;  11:00AM  . TOTAL KNEE ARTHROPLASTY Right 04/09/2018   Procedure: TOTAL KNEE ARTHROPLASTY;  Surgeon: Carole Civil, MD;  Location: [facility];  Service: Orthopedics;  Laterality: Right;  . TUBAL LIGATION      Family History  Problem Relation Age of Onset  . Heart disease Father   . Stroke Father   . Hypertension Sister   . Hypertension Sister    . Heart disease Brother   . Hypertension Mother     Social History   Socioeconomic History  . Marital status: Widowed    Spouse name: Not on file  . Number of children: 3  . Years of education: Not on file  . Highest education level: Not on file  Occupational History  . Occupation: retired, Psychiatrist: [employer]  Tobacco Use  . Smoking status: Former Smoker    Packs/day: 0.30    Years: 35.00    Pack years: 10.50    Types: Cigarettes    Quit date: 08/22/1996    Years since quitting: 23.7  . Smokeless tobacco: Never Used  Vaping Use  . Vaping Use: Never used  Substance and Sexual Activity  . Alcohol use: No  . Drug use: No  . Sexual activity: Yes    Birth control/protection: Post-menopausal  Other Topics Concern  . Not on file  Social History Narrative  . Not on file   Social Determinants of Health   Financial Resource Strain: Not on file  Food Insecurity: Not on file  Transportation Needs: Not on file  Physical Activity: Not on file  Stress: Not on file  Social Connections: Not on file  Intimate Partner Violence: Not on file    ROS Review of Systems  Constitutional: Negative for  chills and fever.  HENT: Negative for congestion, sinus pressure, sinus pain and sore throat.   Eyes: Negative for pain and discharge.  Respiratory: Negative for cough and shortness of breath.   Cardiovascular: Negative for chest pain and palpitations.  Gastrointestinal: Negative for abdominal pain, constipation, diarrhea, nausea and vomiting.  Endocrine: Negative for polydipsia and polyuria.  Genitourinary: Negative for dysuria and hematuria.  Musculoskeletal: Negative for neck pain and neck stiffness.  Skin: Negative for rash.  Neurological: Negative for dizziness and weakness.  Psychiatric/Behavioral: Negative for agitation and behavioral problems.    Objective:   Today's Vitals: BP (!) 160/80 (BP Location: Right Arm, Cuff Size: Normal)   Pulse 83    Resp 18   Ht 5' 6"  (1.676 m)   Wt 199 lb 6.4 oz (90.4 kg)   SpO2 100%   BMI 32.18 kg/m   Physical Exam Vitals reviewed.  Constitutional:      General: She is not in acute distress.    Appearance: She is not diaphoretic.  HENT:     Head: Normocephalic and atraumatic.     Nose: Nose normal.     Mouth/Throat:     Mouth: Mucous membranes are moist.  Eyes:     General: No scleral icterus.    Extraocular Movements: Extraocular movements intact.  Cardiovascular:     Rate and Rhythm: Normal rate and regular rhythm.     Pulses: Normal pulses.     Heart sounds: Normal heart sounds. No murmur heard.   Pulmonary:     Breath sounds: Normal breath sounds. No wheezing or rales.  Musculoskeletal:     Cervical back: Neck supple. No tenderness.     Right lower leg: No edema.     Left lower leg: No edema.  Skin:    General: Skin is warm.     Findings: No rash.  Neurological:     General: No focal deficit present.     Mental Status: She is alert and oriented to person, place, and time.  Psychiatric:        Mood and Affect: Mood normal.        Behavior: Behavior normal.     Assessment & Plan:   Problem List Items Addressed This Visit      Encounter to establish care    -  Primary Care established Previous chart reviewed History and medications reviewed with the patient   Cardiovascular and Mediastinum   Essential hypertension, benign    BP Readings from Last 1 Encounters:  06/02/20 (!) 160/80   uncontrolled with Amlodipine 5 mg QD Was on 10 mg, but had to be decreased to 5 mg due to leg edema Added Telmisartan 20 mg QD Has underlying CKD Counseled for compliance with the medications Advised DASH diet and moderate exercise/walking, at least 150 mins/week      Relevant Medications   furosemide (LASIX) 20 MG tablet   amLODipine (NORVASC) 5 MG tablet   telmisartan (MICARDIS) 20 MG tablet   Other Relevant Orders   CBC with Differential/Platelet   CMP14+EGFR   TSH      Digestive   GERD (gastroesophageal reflux disease)    On Nexium        Musculoskeletal and Integument   OA (osteoarthritis) of knee    S/p right TKA Tylenol PRN      Relevant Orders   Vitamin D (25 hydroxy)     Genitourinary   CKD (chronic kidney disease) stage 3, GFR 30-59 ml/min (HCC)  Last BMP reviewed On Lasix Added Telmisartan Check BMP in the next visit, will refer to Nephrology if persistent or worse kidney function test Avoid nephrotoxic agents Check UA      Relevant Orders   CMP14+EGFR   Urinalysis, Routine w reflex microscopic     Other   Hyperlipidemia    On statin Check lipid profile      Relevant Medications   furosemide (LASIX) 20 MG tablet   amLODipine (NORVASC) 5 MG tablet   telmisartan (MICARDIS) 20 MG tablet   Other Relevant Orders   Lipid panel   Hemoglobin A1c   Insomnia    On Trazodone PRN Vistaril PRN for anxiety       Other Visit Diagnoses       Leg edema       Relevant Medications   furosemide (LASIX) 20 MG tablet      Outpatient Encounter Medications as of 06/02/2020  Medication Sig  . amLODipine (NORVASC) 5 MG tablet Take 1 tablet (5 mg total) by mouth daily.  Marland Kitchen aspirin 81 MG tablet Take 1 tablet (81 mg total) by mouth daily.  . Calcium Carb-Cholecalciferol (CALCIUM + D3) 600-200 MG-UNIT TABS Take 1 tablet by mouth 2 (two) times daily.  Marland Kitchen esomeprazole (NEXIUM) 40 MG capsule Take 1 capsule (40 mg total) by mouth daily.  . furosemide (LASIX) 20 MG tablet Take 1 tablet (20 mg total) by mouth daily.  . hydrOXYzine (ATARAX/VISTARIL) 10 MG tablet Take 1 tablet (10 mg total) by mouth every 8 (eight) hours as needed. itching  . Multiple Vitamins-Minerals (MULTIVITAMIN WITH MINERALS) tablet Take 1 tablet by mouth daily.  . simvastatin (ZOCOR) 20 MG tablet Take 1 tablet (20 mg total) by mouth at bedtime.  Marland Kitchen telmisartan (MICARDIS) 20 MG tablet Take 1 tablet (20 mg total) by mouth daily.  . traZODone (DESYREL) 50 MG tablet Take 0.5-1  tablets (25-50 mg total) by mouth at bedtime as needed for sleep.  . [DISCONTINUED] amLODipine (NORVASC) 10 MG tablet Take 0.5 tablets (5 mg total) by mouth daily.  . [DISCONTINUED] furosemide (LASIX) 40 MG tablet Take 1 tablet (40 mg total) by mouth daily.  . [DISCONTINUED] ibuprofen (ADVIL,MOTRIN) 200 MG tablet Take 800 mg by mouth every 6 (six) hours as needed for moderate pain.    No facility-administered encounter medications on file as of 06/02/2020.    Follow-up: Return in about 4 weeks (around 06/30/2020) for Annual physical.   Lindell Spar, MD

## 2020-06-02 NOTE — Assessment & Plan Note (Signed)
On Nexium 

## 2020-06-02 NOTE — Patient Instructions (Signed)
Please start taking Telmisartan as prescribed for blood pressure.  Please continue to take other medications as prescribed.  Please follow low salt diet.  Please get fasting blood tests done before the next visit.

## 2020-06-02 NOTE — Assessment & Plan Note (Signed)
On statin Check lipid profile 

## 2020-06-02 NOTE — Assessment & Plan Note (Signed)
BP Readings from Last 1 Encounters:  06/02/20 (!) 160/80   uncontrolled with Amlodipine 5 mg QD Was on 10 mg, but had to be decreased to 5 mg due to leg edema Added Telmisartan 20 mg QD Has underlying CKD Counseled for compliance with the medications Advised DASH diet and moderate exercise/walking, at least 150 mins/week

## 2020-06-02 NOTE — Assessment & Plan Note (Signed)
On Trazodone PRN Vistaril PRN for anxiety

## 2020-06-02 NOTE — Assessment & Plan Note (Signed)
S/p right TKA Tylenol PRN

## 2020-06-02 NOTE — Assessment & Plan Note (Signed)
Last BMP reviewed On Lasix Added Telmisartan Check BMP in the next visit, will refer to Nephrology if persistent or worse kidney function test Avoid nephrotoxic agents Check UA

## 2020-06-02 NOTE — Progress Notes (Signed)
Urinalysis placed

## 2020-07-01 ENCOUNTER — Encounter: Payer: Medicare PPO | Admitting: Internal Medicine

## 2020-07-07 ENCOUNTER — Other Ambulatory Visit: Payer: Self-pay | Admitting: *Deleted

## 2020-07-07 ENCOUNTER — Encounter: Payer: Medicare PPO | Admitting: Internal Medicine

## 2020-07-07 DIAGNOSIS — I10 Essential (primary) hypertension: Secondary | ICD-10-CM

## 2020-07-07 MED ORDER — TELMISARTAN 20 MG PO TABS: 20.0000 mg | ORAL_TABLET | Freq: Every day | ORAL | 1 refills | Status: DC

## 2020-07-14 LAB — LIPID PANEL
Chol/HDL Ratio: 4.2 ratio (ref 0.0–4.4)
Cholesterol, Total: 156 mg/dL (ref 100–199)
HDL: 37 mg/dL — ABNORMAL LOW (ref >39)
LDL Chol Calc (NIH): 96 mg/dL (ref 0–99)
Triglycerides: 127 mg/dL (ref 0–149)
VLDL Cholesterol Cal: 23 mg/dL (ref 5–40)

## 2020-07-14 LAB — CMP14+EGFR
ALT: 17 IU/L (ref 0–32)
AST: 19 IU/L (ref 0–40)
Albumin/Globulin Ratio: 1.4 (ref 1.2–2.2)
Albumin: 4.2 g/dL (ref 3.7–4.7)
Alkaline Phosphatase: 81 IU/L (ref 44–121)
BUN/Creatinine Ratio: 11 — ABNORMAL LOW (ref 12–28)
BUN: 14 mg/dL (ref 8–27)
Bilirubin Total: 0.3 mg/dL (ref 0.0–1.2)
CO2: 21 mmol/L (ref 20–29)
Calcium: 9.7 mg/dL (ref 8.7–10.3)
Chloride: 106 mmol/L (ref 96–106)
Creatinine, Ser: 1.23 mg/dL — ABNORMAL HIGH (ref 0.57–1.00)
Globulin, Total: 2.9 g/dL (ref 1.5–4.5)
Glucose: 82 mg/dL (ref 65–99)
Potassium: 4.6 mmol/L (ref 3.5–5.2)
Sodium: 142 mmol/L (ref 134–144)
Total Protein: 7.1 g/dL (ref 6.0–8.5)
eGFR: 47 mL/min/{1.73_m2} — ABNORMAL LOW (ref >59)

## 2020-07-14 LAB — HEMOGLOBIN A1C
Est. average glucose Bld gHb Est-mCnc: 126 mg/dL
Hgb A1c MFr Bld: 6 % — ABNORMAL HIGH (ref 4.8–5.6)

## 2020-07-14 LAB — TSH: TSH: 1.24 u[IU]/mL (ref 0.450–4.500)

## 2020-07-14 LAB — CBC WITH DIFFERENTIAL/PLATELET
Basophils Absolute: 0.1 10*3/uL (ref 0.0–0.2)
Basos: 1 %
EOS (ABSOLUTE): 0.3 10*3/uL (ref 0.0–0.4)
Eos: 3 %
Hematocrit: 37.4 % (ref 34.0–46.6)
Hemoglobin: 12.4 g/dL (ref 11.1–15.9)
Immature Grans (Abs): 0 10*3/uL (ref 0.0–0.1)
Immature Granulocytes: 0 %
Lymphocytes Absolute: 1.6 10*3/uL (ref 0.7–3.1)
Lymphs: 20 %
MCH: 29 pg (ref 26.6–33.0)
MCHC: 33.2 g/dL (ref 31.5–35.7)
MCV: 88 fL (ref 79–97)
Monocytes Absolute: 0.7 10*3/uL (ref 0.1–0.9)
Monocytes: 9 %
Neutrophils Absolute: 5.3 10*3/uL (ref 1.4–7.0)
Neutrophils: 67 %
Platelets: 236 10*3/uL (ref 150–450)
RBC: 4.27 x10E6/uL (ref 3.77–5.28)
RDW: 13.2 % (ref 11.7–15.4)
WBC: 7.9 10*3/uL (ref 3.4–10.8)

## 2020-07-14 LAB — URINALYSIS, ROUTINE W REFLEX MICROSCOPIC
Bilirubin, UA: NEGATIVE
Glucose, UA: NEGATIVE
Ketones, UA: NEGATIVE
Nitrite, UA: NEGATIVE
Protein,UA: NEGATIVE
RBC, UA: NEGATIVE
Specific Gravity, UA: 1.013 (ref 1.005–1.030)
Urobilinogen, Ur: 0.2 mg/dL (ref 0.2–1.0)
pH, UA: 5.5 (ref 5.0–7.5)

## 2020-07-14 LAB — MICROSCOPIC EXAMINATION
Bacteria, UA: NONE SEEN
Casts: NONE SEEN /lpf
RBC, Urine: NONE SEEN /hpf (ref 0–2)

## 2020-07-14 LAB — VITAMIN D 25 HYDROXY (VIT D DEFICIENCY, FRACTURES): Vit D, 25-Hydroxy: 6.5 ng/mL — ABNORMAL LOW (ref 30.0–100.0)

## 2020-07-22 ENCOUNTER — Encounter: Payer: Self-pay | Admitting: Internal Medicine

## 2020-07-22 ENCOUNTER — Ambulatory Visit (INDEPENDENT_AMBULATORY_CARE_PROVIDER_SITE_OTHER): Payer: Medicare PPO | Admitting: Internal Medicine

## 2020-07-22 ENCOUNTER — Other Ambulatory Visit: Payer: Self-pay

## 2020-07-22 VITALS — BP 154/77 | HR 83 | Temp 98.6°F | Resp 18 | Ht 65.0 in | Wt 198.1 lb

## 2020-07-22 DIAGNOSIS — Z1231 Encounter for screening mammogram for malignant neoplasm of breast: Secondary | ICD-10-CM

## 2020-07-22 DIAGNOSIS — E559 Vitamin D deficiency, unspecified: Secondary | ICD-10-CM

## 2020-07-22 DIAGNOSIS — R7303 Prediabetes: Secondary | ICD-10-CM | POA: Diagnosis not present

## 2020-07-22 DIAGNOSIS — I10 Essential (primary) hypertension: Secondary | ICD-10-CM

## 2020-07-22 DIAGNOSIS — Z0001 Encounter for general adult medical examination with abnormal findings: Secondary | ICD-10-CM

## 2020-07-22 DIAGNOSIS — N1831 Chronic kidney disease, stage 3a: Secondary | ICD-10-CM

## 2020-07-22 MED ORDER — VITAMIN D (ERGOCALCIFEROL) 1.25 MG (50000 UNIT) PO CAPS: 50000.0000 [IU] | ORAL_CAPSULE | ORAL | 5 refills | Status: DC

## 2020-07-22 MED ORDER — TELMISARTAN 40 MG PO TABS: 40.0000 mg | ORAL_TABLET | Freq: Every day | ORAL | 3 refills | Status: DC

## 2020-07-22 NOTE — Assessment & Plan Note (Signed)
Last vitamin D Lab Results  Component Value Date   VD25OH 6.5 (L) 07/13/2020   Started Vitamin D 50,000 IU once every week

## 2020-07-22 NOTE — Assessment & Plan Note (Addendum)
BMP reviewed, CKD stage 3a, stable No proteinuria On Lasix and Telmisartan Check BMP in the next visit Avoid nephrotoxic agents

## 2020-07-22 NOTE — Assessment & Plan Note (Addendum)
BP Readings from Last 1 Encounters:  07/22/20 (!) 154/77   uncontrolled with Amlodipine 5 mg QD and Telmisartan 20 mg QD Increased Telmisartan to 40 mg QD Has underlying CKD Counseled for compliance with the medications Advised DASH diet and moderate exercise/walking, at least 150 mins/week

## 2020-07-22 NOTE — Progress Notes (Signed)
Established Patient Office Visit  Subjective:  Patient ID: Robin Villa, female    DOB: 03/16/1950  Age: 70 y.o. MRN: [id number]  CC:  Chief Complaint  Patient presents with   Annual Exam    Annual exam pt feels like ears are stopped up for a few months this happened when she had really bad sinuses back in the spring and they never stopped feeling stopped up     HPI Robin Villa  is a 70 year old female with PMh of HTN, CKD stage 3, GERD, OA of knee and insomnia who presents for annual physical.  HTN: BP was still elevated. She states that she needs to follow low salt diet and decrease her pork intake. She denies any headache, dizziness, chest pain, dyspnea or palpitations.  Her Vitamin D level was low and is willing to start Vitamin D.  Blood tests were reviewed and discussed with the patient.  Past Medical History:  Diagnosis Date   Arthritis    Heart murmur    asymptomatic   Hypercholesteremia    Hypertension     Past Surgical History:  Procedure Laterality Date   CARPAL TUNNEL RELEASE     bilateral    CATARACT EXTRACTION W/PHACO Right 09/07/2014   Procedure: CATARACT EXTRACTION PHACO AND INTRAOCULAR LENS PLACEMENT RIGHT EYE CDE=6.10;  Surgeon: Tonny Branch, MD;  Location: [facility];  Service: Ophthalmology;  Laterality: Right;   COLONOSCOPY  09/15/2011   Procedure: COLONOSCOPY;  Surgeon: Danie Binder, MD;  Location: [facility];  Service: Endoscopy;  Laterality: N/A;  11:00AM   TOTAL KNEE ARTHROPLASTY Right 04/09/2018   Procedure: TOTAL KNEE ARTHROPLASTY;  Surgeon: Carole Civil, MD;  Location: [facility];  Service: Orthopedics;  Laterality: Right;   TUBAL LIGATION      Family History  Problem Relation Age of Onset   Heart disease Father    Stroke Father    Hypertension Sister    Hypertension Sister    Heart disease Brother    Hypertension Mother     Social History   Socioeconomic History   Marital status: Widowed    Spouse name: Not on file   Number of  children: 3   Years of education: Not on file   Highest education level: Not on file  Occupational History   Occupation: retired, Psychiatrist: [employer]  Tobacco Use   Smoking status: Former    Packs/day: 0.30    Years: 35.00    Pack years: 10.50    Types: Cigarettes    Quit date: 08/22/1996    Years since quitting: 23.9   Smokeless tobacco: Never  Vaping Use   Vaping Use: Never used  Substance and Sexual Activity   Alcohol use: No   Drug use: No   Sexual activity: Yes    Birth control/protection: Post-menopausal  Other Topics Concern   Not on file  Social History Narrative   Not on file   Social Determinants of Health   Financial Resource Strain: Not on file  Food Insecurity: Not on file  Transportation Needs: Not on file  Physical Activity: Not on file  Stress: Not on file  Social Connections: Not on file  Intimate Partner Violence: Not on file    Outpatient Medications Prior to Visit  Medication Sig Dispense Refill   amLODipine (NORVASC) 5 MG tablet Take 1 tablet (5 mg total) by mouth daily. 90 tablet 1   aspirin 81 MG tablet Take 1 tablet (  81 mg total) by mouth daily. 30 tablet    esomeprazole (NEXIUM) 40 MG capsule Take 1 capsule (40 mg total) by mouth daily. 90 capsule 3   furosemide (LASIX) 20 MG tablet Take 1 tablet (20 mg total) by mouth daily. 30 tablet 5   hydrOXYzine (ATARAX/VISTARIL) 10 MG tablet Take 1 tablet (10 mg total) by mouth every 8 (eight) hours as needed. itching 30 tablet 0   Multiple Vitamins-Minerals (MULTIVITAMIN WITH MINERALS) tablet Take 1 tablet by mouth daily.     simvastatin (ZOCOR) 20 MG tablet Take 1 tablet (20 mg total) by mouth at bedtime. 90 tablet 0   traZODone (DESYREL) 50 MG tablet Take 0.5-1 tablets (25-50 mg total) by mouth at bedtime as needed for sleep. 30 tablet 3   Calcium Carb-Cholecalciferol (CALCIUM + D3) 600-200 MG-UNIT TABS Take 1 tablet by mouth 2 (two) times daily.     telmisartan (MICARDIS) 20  MG tablet Take 1 tablet (20 mg total) by mouth daily. 90 tablet 1   No facility-administered medications prior to visit.    No Known Allergies  ROS Review of Systems  Constitutional:  Negative for chills and fever.  HENT:  Negative for congestion, sinus pressure, sinus pain and sore throat.   Eyes:  Negative for pain and discharge.  Respiratory:  Negative for cough and shortness of breath.   Cardiovascular:  Negative for chest pain and palpitations.  Gastrointestinal:  Negative for abdominal pain, constipation, diarrhea, nausea and vomiting.  Endocrine: Negative for polydipsia and polyuria.  Genitourinary:  Negative for dysuria and hematuria.  Musculoskeletal:  Negative for neck pain and neck stiffness.  Skin:  Negative for rash.  Neurological:  Negative for dizziness and weakness.  Psychiatric/Behavioral:  Negative for agitation and behavioral problems.      Objective:    Physical Exam Vitals reviewed.  Constitutional:      General: She is not in acute distress.    Appearance: She is obese. She is not diaphoretic.  HENT:     Head: Normocephalic and atraumatic.     Nose: Nose normal.     Mouth/Throat:     Mouth: Mucous membranes are moist.  Eyes:     General: No scleral icterus.    Extraocular Movements: Extraocular movements intact.  Cardiovascular:     Rate and Rhythm: Normal rate and regular rhythm.     Pulses: Normal pulses.     Heart sounds: Normal heart sounds. No murmur heard. Pulmonary:     Breath sounds: Normal breath sounds. No wheezing or rales.  Abdominal:     Palpations: Abdomen is soft.     Tenderness: There is no abdominal tenderness.  Musculoskeletal:     Cervical back: Neck supple. No tenderness.     Right lower leg: No edema.     Left lower leg: No edema.  Skin:    General: Skin is warm.     Findings: No rash.  Neurological:     General: No focal deficit present.     Mental Status: She is alert and oriented to person, place, and time.      Cranial Nerves: No cranial nerve deficit.     Sensory: No sensory deficit.     Motor: No weakness.  Psychiatric:        Mood and Affect: Mood normal.        Behavior: Behavior normal.    BP (!) 154/77 (BP Location: Left Arm, Patient Position: Sitting, Cuff Size: Normal)   Pulse 83  Temp 98.6 F (37 C) (Oral)   Resp 18   Ht $R'5\' 5"'Fk$  (1.651 m)   Wt 198 lb 1.3 oz (89.8 kg)   SpO2 97%   BMI 32.96 kg/m  Wt Readings from Last 3 Encounters:  07/22/20 198 lb 1.3 oz (89.8 kg)  06/02/20 199 lb 6.4 oz (90.4 kg)  04/26/20 201 lb (91.2 kg)     Health Maintenance Due  Topic Date Due   COVID-19 Vaccine (3 - Booster for Pfizer series) 10/01/2019   MAMMOGRAM  06/11/2020    There are no preventive care reminders to display for this patient.  Lab Results  Component Value Date   TSH 1.240 07/13/2020   Lab Results  Component Value Date   WBC 7.9 07/13/2020   HGB 12.4 07/13/2020   HCT 37.4 07/13/2020   MCV 88 07/13/2020   PLT 236 07/13/2020   Lab Results  Component Value Date   NA 142 07/13/2020   K 4.6 07/13/2020   CO2 21 07/13/2020   GLUCOSE 82 07/13/2020   BUN 14 07/13/2020   CREATININE 1.23 (H) 07/13/2020   BILITOT 0.3 07/13/2020   ALKPHOS 81 07/13/2020   AST 19 07/13/2020   ALT 17 07/13/2020   PROT 7.1 07/13/2020   ALBUMIN 4.2 07/13/2020   CALCIUM 9.7 07/13/2020   ANIONGAP 7 04/10/2018   EGFR 47 (L) 07/13/2020   Lab Results  Component Value Date   CHOL 156 07/13/2020   Lab Results  Component Value Date   HDL 37 (L) 07/13/2020   Lab Results  Component Value Date   LDLCALC 96 07/13/2020   Lab Results  Component Value Date   TRIG 127 07/13/2020   Lab Results  Component Value Date   CHOLHDL 4.2 07/13/2020   Lab Results  Component Value Date   HGBA1C 6.0 (H) 07/13/2020      Assessment & Plan:   Problem List Items Addressed This Visit       Encounter for general adult medical examination with abnormal findings - Primary   Annual exam as  documented. Counseling done  re healthy lifestyle involving commitment to 150 minutes exercise per week, heart healthy diet, and attaining healthy weight.The importance of adequate sleep also discussed. Changes in health habits are decided on by the patient with goals and time frames  set for achieving them. Immunization and cancer screening needs are specifically addressed at this visit.        Cardiovascular and Mediastinum   Essential hypertension, benign    BP Readings from Last 1 Encounters:  07/22/20 (!) 154/77  uncontrolled with Amlodipine 5 mg QD and Telmisartan 20 mg QD Increased Telmisartan to 40 mg QD Has underlying CKD Counseled for compliance with the medications Advised DASH diet and moderate exercise/walking, at least 150 mins/week       Relevant Medications   telmisartan (MICARDIS) 40 MG tablet     Genitourinary   CKD (chronic kidney disease) stage 3, GFR 30-59 ml/min (HCC)    BMP reviewed, CKD stage 3a, stable No proteinuria On Lasix and Telmisartan Check BMP in the next visit Avoid nephrotoxic agents         Other         Vitamin D deficiency    Last vitamin D Lab Results  Component Value Date   VD25OH 6.5 (L) 07/13/2020  Started Vitamin D 50,000 IU once every week       Relevant Medications   Vitamin D, Ergocalciferol, (DRISDOL) 1.25 MG (50000 UNIT) CAPS  capsule   Prediabetes    Lab Results  Component Value Date   HGBA1C 6.0 (H) 07/13/2020  Advised to follow DASH diet       Other Visit Diagnoses     Screening mammogram for breast cancer       Relevant Orders   MM 3D SCREEN BREAST BILATERAL       Meds ordered this encounter  Medications   Vitamin D, Ergocalciferol, (DRISDOL) 1.25 MG (50000 UNIT) CAPS capsule    Sig: Take 1 capsule (50,000 Units total) by mouth every 7 (seven) days.    Dispense:  5 capsule    Refill:  5   telmisartan (MICARDIS) 40 MG tablet    Sig: Take 1 tablet (40 mg total) by mouth daily.    Dispense:  30  tablet    Refill:  3    Dose change    Follow-up: Return in about 4 months (around 11/21/2020) for HTN and CKD.    Lindell Spar, MD

## 2020-07-22 NOTE — Assessment & Plan Note (Signed)
Lab Results  Component Value Date   HGBA1C 6.0 (H) 07/13/2020   Advised to follow DASH diet

## 2020-07-22 NOTE — Patient Instructions (Addendum)
Please start taking Telmisartan 40 mg once daily instead of 20 mg. Okay to take 2 tablets of Telmisartan 20 mg until you complete current medication at home.  Please start taking Vitamin D as prescribed. Start taking Calcium 600 mg twice daily.  Continue to take other medications as prescribed.  Please follow low salt diet and ambulate as tolerated.

## 2020-07-22 NOTE — Assessment & Plan Note (Signed)
Annual exam as documented. Counseling done  re healthy lifestyle involving commitment to 150 minutes exercise per week, heart healthy diet, and attaining healthy weight.The importance of adequate sleep also discussed. Changes in health habits are decided on by the patient with goals and time frames  set for achieving them. Immunization and cancer screening needs are specifically addressed at this visit. 

## 2020-08-24 ENCOUNTER — Other Ambulatory Visit: Payer: Self-pay

## 2020-08-24 ENCOUNTER — Ambulatory Visit (INDEPENDENT_AMBULATORY_CARE_PROVIDER_SITE_OTHER): Payer: Medicare PPO | Admitting: *Deleted

## 2020-08-24 DIAGNOSIS — Z0000 Encounter for general adult medical examination without abnormal findings: Secondary | ICD-10-CM

## 2020-08-24 NOTE — Patient Instructions (Signed)
Robin Villa , Thank you for taking time to come for your Medicare Wellness Visit. I appreciate your ongoing commitment to your health goals. Please review the following plan we discussed and let me know if I can assist you in the future.   Screening recommendations/referrals: Colonoscopy: Completed Due 09-14-2021 Mammogram: Scheduled 08-26-20 Bone Density: Completed Due 07-17-21 Recommended yearly ophthalmology/optometry visit for glaucoma screening and checkup Recommended yearly dental visit for hygiene and checkup  Vaccinations: Influenza vaccine: Completed Due 08-30-20 Pneumococcal vaccine: Completed Tdap vaccine: Due 01-21-23 Shingles vaccine: Completed    Advanced directives: Information provided   Conditions/risks identified: Hypertension  Next appointment: 1 year    Preventive Care 72 Years and Older, Female Preventive care refers to lifestyle choices and visits with your health care provider that can promote health and wellness. What does preventive care include? A yearly physical exam. This is also called an annual well check. Dental exams once or twice a year. Routine eye exams. Ask your health care provider how often you should have your eyes checked. Personal lifestyle choices, including: Daily care of your teeth and gums. Regular physical activity. Eating a healthy diet. Avoiding tobacco and drug use. Limiting alcohol use. Practicing safe sex. Taking low-dose aspirin every day. Taking vitamin and mineral supplements as recommended by your health care provider. What happens during an annual well check? The services and screenings done by your health care provider during your annual well check will depend on your age, overall health, lifestyle risk factors, and family history of disease. Counseling  Your health care provider may ask you questions about your: Alcohol use. Tobacco use. Drug use. Emotional well-being. Home and relationship well-being. Sexual  activity. Eating habits. History of falls. Memory and ability to understand (cognition). Work and work Statistician. Reproductive health. Screening  You may have the following tests or measurements: Height, weight, and BMI. Blood pressure. Lipid and cholesterol levels. These may be checked every 5 years, or more frequently if you are over 72 years old. Skin check. Lung cancer screening. You may have this screening every year starting at age 46 if you have a 30-pack-year history of smoking and currently smoke or have quit within the past 15 years. Fecal occult blood test (FOBT) of the stool. You may have this test every year starting at age 75. Flexible sigmoidoscopy or colonoscopy. You may have a sigmoidoscopy every 5 years or a colonoscopy every 10 years starting at age 13. Hepatitis C blood test. Hepatitis B blood test. Sexually transmitted disease (STD) testing. Diabetes screening. This is done by checking your blood sugar (glucose) after you have not eaten for a while (fasting). You may have this done every 1-3 years. Bone density scan. This is done to screen for osteoporosis. You may have this done starting at age 9. Mammogram. This may be done every 1-2 years. Talk to your health care provider about how often you should have regular mammograms. Talk with your health care provider about your test results, treatment options, and if necessary, the need for more tests. Vaccines  Your health care provider may recommend certain vaccines, such as: Influenza vaccine. This is recommended every year. Tetanus, diphtheria, and acellular pertussis (Tdap, Td) vaccine. You may need a Td booster every 10 years. Zoster vaccine. You may need this after age 36. Pneumococcal 13-valent conjugate (PCV13) vaccine. One dose is recommended after age 56. Pneumococcal polysaccharide (PPSV23) vaccine. One dose is recommended after age 85. Talk to your health care provider about which screenings and vaccines  you need and how often you need them. This information is not intended to replace advice given to you by your health care provider. Make sure you discuss any questions you have with your health care provider. Document Released: 02/12/2015 Document Revised: 10/06/2015 Document Reviewed: 11/17/2014 Elsevier Interactive Patient Education  2017 [place] Prevention in the Home Falls can cause injuries. They can happen to people of all ages. There are many things you can do to make your home safe and to help prevent falls. What can I do on the outside of my home? Regularly fix the edges of walkways and driveways and fix any cracks. Remove anything that might make you trip as you walk through a door, such as a raised step or threshold. Trim any bushes or trees on the path to your home. Use bright outdoor lighting. Clear any walking paths of anything that might make someone trip, such as rocks or tools. Regularly check to see if handrails are loose or broken. Make sure that both sides of any steps have handrails. Any raised decks and porches should have guardrails on the edges. Have any leaves, snow, or ice cleared regularly. Use sand or salt on walking paths during winter. Clean up any spills in your garage right away. This includes oil or grease spills. What can I do in the bathroom? Use night lights. Install grab bars by the toilet and in the tub and shower. Do not use towel bars as grab bars. Use non-skid mats or decals in the tub or shower. If you need to sit down in the shower, use a plastic, non-slip stool. Keep the floor dry. Clean up any water that spills on the floor as soon as it happens. Remove soap buildup in the tub or shower regularly. Attach bath mats securely with double-sided non-slip rug tape. Do not have throw rugs and other things on the floor that can make you trip. What can I do in the bedroom? Use night lights. Make sure that you have a light by your bed that  is easy to reach. Do not use any sheets or blankets that are too big for your bed. They should not hang down onto the floor. Have a firm chair that has side arms. You can use this for support while you get dressed. Do not have throw rugs and other things on the floor that can make you trip. What can I do in the kitchen? Clean up any spills right away. Avoid walking on wet floors. Keep items that you use a lot in easy-to-reach places. If you need to reach something above you, use a strong step stool that has a grab bar. Keep electrical cords out of the way. Do not use floor polish or wax that makes floors slippery. If you must use wax, use non-skid floor wax. Do not have throw rugs and other things on the floor that can make you trip. What can I do with my stairs? Do not leave any items on the stairs. Make sure that there are handrails on both sides of the stairs and use them. Fix handrails that are broken or loose. Make sure that handrails are as long as the stairways. Check any carpeting to make sure that it is firmly attached to the stairs. Fix any carpet that is loose or worn. Avoid having throw rugs at the top or bottom of the stairs. If you do have throw rugs, attach them to the floor with carpet tape. Make sure that  you have a light switch at the top of the stairs and the bottom of the stairs. If you do not have them, ask someone to add them for you. What else can I do to help prevent falls? Wear shoes that: Do not have high heels. Have rubber bottoms. Are comfortable and fit you well. Are closed at the toe. Do not wear sandals. If you use a stepladder: Make sure that it is fully opened. Do not climb a closed stepladder. Make sure that both sides of the stepladder are locked into place. Ask someone to hold it for you, if possible. Clearly mark and make sure that you can see: Any grab bars or handrails. First and last steps. Where the edge of each step is. Use tools that help you  move around (mobility aids) if they are needed. These include: Canes. Walkers. Scooters. Crutches. Turn on the lights when you go into a dark area. Replace any light bulbs as soon as they burn out. Set up your furniture so you have a clear path. Avoid moving your furniture around. If any of your floors are uneven, fix them. If there are any pets around you, be aware of where they are. Review your medicines with your doctor. Some medicines can make you feel dizzy. This can increase your chance of falling. Ask your doctor what other things that you can do to help prevent falls. This information is not intended to replace advice given to you by your health care provider. Make sure you discuss any questions you have with your health care provider. Document Released: 11/12/2008 Document Revised: 06/24/2015 Document Reviewed: 02/20/2014 Elsevier Interactive Patient Education  2017 [employer].

## 2020-08-24 NOTE — Progress Notes (Signed)
Subjective:   Robin Villa is a 70 y.o. female who presents for Medicare Annual (Subsequent) preventive examination.  Review of Systems           Objective:    There were no vitals filed for this visit. There is no height or weight on file to calculate BMI.  Advanced Directives 06/02/2019 04/09/2018 04/09/2018 04/04/2018 12/27/2016 09/07/2014 09/02/2014  Does Patient Have a Medical Advance Directive? No No - Yes No No No  Type of Advance Directive - - - Living will - - -  Does patient want to make changes to medical advance directive? - No - Patient declined No - Patient declined No - Patient declined - - -  Would patient like information on creating a medical advance directive? Yes (MAU/Ambulatory/Procedural Areas - Information given) No - Patient declined - - Yes (Inpatient - patient defers creating a medical advance directive at this time) No - patient declined information No - patient declined information  Pre-existing out of facility DNR order (yellow form or pink MOST form) - - - - - - -    Current Medications (verified) Outpatient Encounter Medications as of 08/24/2020  Medication Sig   amLODipine (NORVASC) 5 MG tablet Take 1 tablet (5 mg total) by mouth daily.   aspirin 81 MG tablet Take 1 tablet (81 mg total) by mouth daily.   esomeprazole (NEXIUM) 40 MG capsule Take 1 capsule (40 mg total) by mouth daily.   furosemide (LASIX) 20 MG tablet Take 1 tablet (20 mg total) by mouth daily.   hydrOXYzine (ATARAX/VISTARIL) 10 MG tablet Take 1 tablet (10 mg total) by mouth every 8 (eight) hours as needed. itching   Multiple Vitamins-Minerals (MULTIVITAMIN WITH MINERALS) tablet Take 1 tablet by mouth daily.   simvastatin (ZOCOR) 20 MG tablet Take 1 tablet (20 mg total) by mouth at bedtime.   telmisartan (MICARDIS) 40 MG tablet Take 1 tablet (40 mg total) by mouth daily.   traZODone (DESYREL) 50 MG tablet Take 0.5-1 tablets (25-50 mg total) by mouth at bedtime as needed for sleep.   Vitamin D,  Ergocalciferol, (DRISDOL) 1.25 MG (50000 UNIT) CAPS capsule Take 1 capsule (50,000 Units total) by mouth every 7 (seven) days.   No facility-administered encounter medications on file as of 08/24/2020.    Allergies (verified) Patient has no known allergies.   History: Past Medical History:  Diagnosis Date   Arthritis    Heart murmur    asymptomatic   Hypercholesteremia    Hypertension    Past Surgical History:  Procedure Laterality Date   CARPAL TUNNEL RELEASE     bilateral    CATARACT EXTRACTION W/PHACO Right 09/07/2014   Procedure: CATARACT EXTRACTION PHACO AND INTRAOCULAR LENS PLACEMENT RIGHT EYE CDE=6.10;  Surgeon: Tonny Branch, MD;  Location: [facility];  Service: Ophthalmology;  Laterality: Right;   COLONOSCOPY  09/15/2011   Procedure: COLONOSCOPY;  Surgeon: Danie Binder, MD;  Location: [facility];  Service: Endoscopy;  Laterality: N/A;  11:00AM   TOTAL KNEE ARTHROPLASTY Right 04/09/2018   Procedure: TOTAL KNEE ARTHROPLASTY;  Surgeon: Carole Civil, MD;  Location: [facility];  Service: Orthopedics;  Laterality: Right;   TUBAL LIGATION     Family History  Problem Relation Age of Onset   Heart disease Father    Stroke Father    Hypertension Sister    Hypertension Sister    Heart disease Brother    Hypertension Mother    Social History   Socioeconomic History  Marital status: Widowed    Spouse name: Not on file   Number of children: 3   Years of education: Not on file   Highest education level: Not on file  Occupational History   Occupation: retired, Psychiatrist: [employer]  Tobacco Use   Smoking status: Former    Packs/day: 0.30    Years: 35.00    Pack years: 10.50    Types: Cigarettes    Quit date: 08/22/1996    Years since quitting: 24.0   Smokeless tobacco: Never  Vaping Use   Vaping Use: Never used  Substance and Sexual Activity   Alcohol use: No   Drug use: No   Sexual activity: Yes    Birth control/protection: Post-menopausal   Other Topics Concern   Not on file  Social History Narrative   Not on file   Social Determinants of Health   Financial Resource Strain: Not on file  Food Insecurity: Not on file  Transportation Needs: Not on file  Physical Activity: Not on file  Stress: Not on file  Social Connections: Not on file    Tobacco Counseling Counseling given: Not Answered   Clinical Intake:                 Diabetic?no         Activities of Daily Living In your present state of health, do you have any difficulty performing the following activities: 06/02/2020  Hearing? N  Vision? N  Difficulty concentrating or making decisions? N  Walking or climbing stairs? N  Dressing or bathing? N  Doing errands, shopping? N  Some recent data might be hidden    Patient Care Team: Lindell Spar, MD as PCP - General (Internal Medicine) Danie Binder, MD (Inactive) (Gastroenterology)  Indicate any recent Medical Services you may have received from other than [facility] providers in the past year (date may be approximate).     Assessment:   This is a routine wellness examination for Robin Villa.  Hearing/Vision screen No results found.  Dietary issues and exercise activities discussed:     Goals Addressed   None   Depression Screen PHQ 2/9 Scores 07/22/2020 06/02/2020 12/22/2019 06/02/2019 09/13/2018 03/13/2018 01/31/2017  PHQ - 2 Score 0 0 1 0 1 0 1  PHQ- 9 Score - - 2 0 - - 3    Fall Risk Fall Risk  07/22/2020 06/02/2020 12/22/2019 06/02/2019 09/13/2018  Falls in the past year? 0 0 0 0 0  Number falls in past yr: 0 0 - - -  Injury with Fall? 0 0 - - -  Risk for fall due to : No Fall Risks No Fall Risks No Fall Risks No Fall Risks -  Follow up Falls evaluation completed Falls evaluation completed Falls evaluation completed Falls evaluation completed Falls evaluation completed    FALL RISK PREVENTION PERTAINING TO THE HOME:  Any stairs in or around the home? Yes  If so, are there any without  handrails? Yes  Home free of loose throw rugs in walkways, pet beds, electrical cords, etc? Yes  Adequate lighting in your home to reduce risk of falls? Yes   ASSISTIVE DEVICES UTILIZED TO PREVENT FALLS:  Life alert? No  Use of a cane, walker or w/c? No  Grab bars in the bathroom? No  Shower chair or bench in shower? No  Elevated toilet seat or a handicapped toilet? No   TIMED UP AND GO:  Was the test performed?  No .  Length of time to ambulate 10 feet: NA sec.     Cognitive Function:        Immunizations Immunization History  Administered Date(s) Administered   Fluad Quad(high Dose 65+) 10/01/2018, 12/22/2019   Hepatitis B 02/27/2008, 04/03/2008   Influenza, High Dose Seasonal PF 01/31/2017, 12/21/2017   Influenza,inj,Quad PF,6+ Mos 02/23/2015   PFIZER(Purple Top)SARS-COV-2 Vaccination 03/31/2019, 05/01/2019   Pneumococcal Conjugate-13 01/21/2014   Pneumococcal Polysaccharide-23 02/23/2015   Td 01/20/2013   Tdap 01/20/2013   Zoster Recombinat (Shingrix) 05/31/2016, 01/31/2017    TDAP status: Up to date  Flu Vaccine status: Up to date  Pneumococcal vaccine status: Up to date  Covid-19 vaccine status: Completed vaccines  Qualifies for Shingles Vaccine? Yes   Zostavax completed Yes   Shingrix Completed?: Yes  Screening Tests Health Maintenance  Topic Date Due   COVID-19 Vaccine (3 - Booster for Pfizer series) 10/01/2019   MAMMOGRAM  06/11/2020   INFLUENZA VACCINE  08/30/2020   COLONOSCOPY (Pts 45-36yr Insurance coverage will need to be confirmed)  09/14/2021   TETANUS/TDAP  01/21/2023   DEXA SCAN  Completed   Hepatitis C Screening  Completed   PNA vac Low Risk Adult  Completed   Zoster Vaccines- Shingrix  Completed   HPV VACCINES  Aged Out    Health Maintenance  Health Maintenance Due  Topic Date Due   COVID-19 Vaccine (3 - Booster for [place]) 10/01/2019   MAMMOGRAM  06/11/2020    Colorectal cancer screening: Type of screening:  Colonoscopy. Completed 09-15-11. Repeat every 10 years  Mammogram status: Completed 06-12-18. Repeat every year  Bone Density status: Completed 07-17-16. Results reflect: Bone density results: NORMAL. Repeat every 5 years.  Lung Cancer Screening: (Low Dose CT Chest recommended if Age 72-80years, 30 pack-year currently smoking OR have quit w/in 15years.) does not qualify.   Lung Cancer Screening Referral: NA  Additional Screening:  Hepatitis C Screening: does qualify; Completed 08-18-15  Vision Screening: Recommended annual ophthalmology exams for early detection of glaucoma and other disorders of the eye. Is the patient up to date with their annual eye exam?  No  Who is the provider or what is the name of the office in which the patient attends annual eye exams? [facility] If pt is not established with a provider, would they like to be referred to a provider to establish care? No .   Dental Screening: Recommended annual dental exams for proper oral hygiene  Community Resource Referral / Chronic Care Management: CRR required this visit?  No   CCM required this visit?  No      Plan:     I have personally reviewed and noted the following in the patient's chart:   Medical and social history Use of alcohol, tobacco or illicit drugs  Current medications and supplements including opioid prescriptions.  Functional ability and status Nutritional status Physical activity Advanced directives List of other physicians Hospitalizations, surgeries, and ER visits in previous 12 months Vitals Screenings to include cognitive, depression, and falls Referrals and appointments  In addition, I have reviewed and discussed with patient certain preventive protocols, quality metrics, and best practice recommendations. A written personalized care plan for preventive services as well as general preventive health recommendations were provided to patient.     [place]  08/24/2020   Nurse Notes: Telehealth visit, spent 40 mins talking to patient

## 2020-08-26 ENCOUNTER — Ambulatory Visit (HOSPITAL_COMMUNITY)
Admission: RE | Admit: 2020-08-26 | Discharge: 2020-08-26 | Disposition: A | Discharge status: Home / Self Care | Payer: Medicare PPO | Source: Ambulatory Visit | Attending: Internal Medicine | Admitting: Internal Medicine

## 2020-08-26 ENCOUNTER — Other Ambulatory Visit: Payer: Self-pay

## 2020-08-26 DIAGNOSIS — Z1231 Encounter for screening mammogram for malignant neoplasm of breast: Secondary | ICD-10-CM | POA: Diagnosis present

## 2020-09-13 NOTE — Progress Notes (Addendum)
I connected with  Robin Villa on 09/13/20 by an audio  enabled telemedicine application and verified that I am speaking with the correct person using two identifiers.   I discussed the limitations, risks, security and privacy concerns of performing an evaluation and management service by telephone and the availability of in person appointments. I also discussed with the patient that there may be a patient responsible charge related to this service. The patient expressed understanding and verbally consented to this telephonic visit.  The patient was at home. The provider was Porfirio Mylar NP. The provider was in office.  This was a telehealth visit.

## 2020-09-29 ENCOUNTER — Other Ambulatory Visit: Payer: Self-pay | Admitting: Family Medicine

## 2020-10-20 ENCOUNTER — Telehealth: Payer: Self-pay | Admitting: Internal Medicine

## 2020-10-20 ENCOUNTER — Other Ambulatory Visit: Payer: Self-pay

## 2020-10-20 MED ORDER — SIMVASTATIN 20 MG PO TABS
20.0000 mg | ORAL_TABLET | Freq: Every day | ORAL | 0 refills | Status: DC
Start: 2020-10-20 — End: 2021-04-27

## 2020-10-20 NOTE — Telephone Encounter (Signed)
Please call the pt regarding Cholesterol medication

## 2020-10-20 NOTE — Telephone Encounter (Signed)
Refill sent.

## 2020-11-25 ENCOUNTER — Encounter: Payer: Self-pay | Admitting: Internal Medicine

## 2020-11-25 ENCOUNTER — Other Ambulatory Visit: Payer: Self-pay

## 2020-11-25 ENCOUNTER — Encounter (INDEPENDENT_AMBULATORY_CARE_PROVIDER_SITE_OTHER): Payer: Self-pay

## 2020-11-25 ENCOUNTER — Ambulatory Visit: Payer: Medicare PPO | Admitting: Internal Medicine

## 2020-11-25 VITALS — BP 162/70 | HR 89 | Temp 98.8°F | Resp 18 | Ht 65.0 in | Wt 201.0 lb

## 2020-11-25 DIAGNOSIS — Z23 Encounter for immunization: Secondary | ICD-10-CM

## 2020-11-25 DIAGNOSIS — E559 Vitamin D deficiency, unspecified: Secondary | ICD-10-CM

## 2020-11-25 DIAGNOSIS — I10 Essential (primary) hypertension: Secondary | ICD-10-CM | POA: Diagnosis not present

## 2020-11-25 DIAGNOSIS — R7303 Prediabetes: Secondary | ICD-10-CM | POA: Diagnosis not present

## 2020-11-25 DIAGNOSIS — N1831 Chronic kidney disease, stage 3a: Secondary | ICD-10-CM

## 2020-11-25 MED ORDER — AMLODIPINE BESYLATE 10 MG PO TABS: 10.0000 mg | ORAL_TABLET | Freq: Every day | ORAL | 1 refills | Status: DC

## 2020-11-25 MED ORDER — MISC. DEVICES MISC: 0 refills | Status: AC

## 2020-11-25 NOTE — Assessment & Plan Note (Signed)
Lab Results  Component Value Date   HGBA1C 6.0 (H) 07/13/2020   Advised to follow DASH diet

## 2020-11-25 NOTE — Assessment & Plan Note (Signed)
BMP reviewed, CKD stage 3a, stable No proteinuria On Lasix and Telmisartan Check CMP Avoid nephrotoxic agents

## 2020-11-25 NOTE — Assessment & Plan Note (Signed)
Last vitamin D Lab Results  Component Value Date   VD25OH 6.5 (L) 07/13/2020   Advised to start Vitamin D 50,000 IU once every week 

## 2020-11-25 NOTE — Assessment & Plan Note (Signed)
BP Readings from Last 1 Encounters:  11/25/20 (!) 162/70   uncontrolled with Amlodipine 5 mg QD and Telmisartan 40 mg QD Increased dose of Amlodipine to 10 mg QD Has underlying CKD Counseled for compliance with the medications Advised DASH diet and moderate exercise/walking, at least 150 mins/week

## 2020-11-25 NOTE — Patient Instructions (Signed)
Please start taking Amlodipine 10 mg instead of 5 mg.  Please follow DASH diet and ambulate as tolerated.  Please start taking Vitamin D as prescribed.  Please get fasting blood tests done before the next visit.

## 2020-11-25 NOTE — Progress Notes (Signed)
Established Patient Office Visit  Subjective:  Patient ID: Robin Villa, female    DOB: March 26, 1950  Age: 70 y.o. MRN: [id number]  CC:  Chief Complaint  Patient presents with   Follow-up    4 month follow up bp is high has not been checking at home would like to see if insurance would cover     HPI Robin Villa  is a 70 year old female with PMH of HTN, CKD stage 3, GERD, OA of knee and insomnia who presents for f/u of her chronic medical conditions.  HTN: Her BP was elevated in the office today.  She has started taking telmisartan 40 mg daily in addition to her amlodipine.  She does not have BP device to check BP at home.  She denies any headache, dizziness, chest pain, dyspnea or palpitations.  She admits that she needs to cut down salt intake.  CKD stage III: Last CMP showed GFR of 47, which was slightly better than prior.  She denies any dysuria or hematuria.  Denies any urinary hesitance or resistance.  Vitamin D deficiency: She has not been taking vitamin D as prescribed.  Instead, she has been taking OTC vitamin D daily.  She is advised to take prescription vitamin D for now, she agrees.  She received flu vaccine in the office today.  Past Medical History:  Diagnosis Date   Arthritis    Heart murmur    asymptomatic   Hypercholesteremia    Hypertension     Past Surgical History:  Procedure Laterality Date   CARPAL TUNNEL RELEASE     bilateral    CATARACT EXTRACTION W/PHACO Right 09/07/2014   Procedure: CATARACT EXTRACTION PHACO AND INTRAOCULAR LENS PLACEMENT RIGHT EYE CDE=6.10;  Surgeon: Tonny Branch, MD;  Location: [facility];  Service: Ophthalmology;  Laterality: Right;   COLONOSCOPY  09/15/2011   Procedure: COLONOSCOPY;  Surgeon: Danie Binder, MD;  Location: [facility];  Service: Endoscopy;  Laterality: N/A;  11:00AM   TOTAL KNEE ARTHROPLASTY Right 04/09/2018   Procedure: TOTAL KNEE ARTHROPLASTY;  Surgeon: Carole Civil, MD;  Location: [facility];  Service: Orthopedics;   Laterality: Right;   TUBAL LIGATION      Family History  Problem Relation Age of Onset   Heart disease Father    Stroke Father    Hypertension Sister    Hypertension Sister    Heart disease Brother    Hypertension Mother     Social History   Socioeconomic History   Marital status: Widowed    Spouse name: Not on file   Number of children: 3   Years of education: Not on file   Highest education level: Not on file  Occupational History   Occupation: retired, Psychiatrist: [employer]  Tobacco Use   Smoking status: Former    Packs/day: 0.30    Years: 35.00    Pack years: 10.50    Types: Cigarettes    Quit date: 08/22/1996    Years since quitting: 24.2   Smokeless tobacco: Never  Vaping Use   Vaping Use: Never used  Substance and Sexual Activity   Alcohol use: No   Drug use: No   Sexual activity: Yes    Birth control/protection: Post-menopausal  Other Topics Concern   Not on file  Social History Narrative   Not on file   Social Determinants of Health   Financial Resource Strain: Low Risk    Difficulty of Paying Living Expenses:  Not hard at all  Food Insecurity: No Food Insecurity   Worried About Charity fundraiser in the Last Year: Never true   Ran Out of Food in the Last Year: Never true  Transportation Needs: No Transportation Needs   Lack of Transportation (Medical): No   Lack of Transportation (Non-Medical): No  Physical Activity: Inactive   Days of Exercise per Week: 0 days   Minutes of Exercise per Session: 0 min  Stress: No Stress Concern Present   Feeling of Stress : Not at all  Social Connections: Moderately Isolated   Frequency of Communication with Friends and Family: More than three times a week   Frequency of Social Gatherings with Friends and Family: More than three times a week   Attends Religious Services: More than 4 times per year   Active Member of [employer] or Organizations: No   Attends Archivist Meetings:  Never   Marital Status: Widowed  Human resources officer Violence: Not At Risk   Fear of Current or Ex-Partner: No   Emotionally Abused: No   Physically Abused: No   Sexually Abused: No    Outpatient Medications Prior to Visit  Medication Sig Dispense Refill   aspirin 81 MG tablet Take 1 tablet (81 mg total) by mouth daily. 30 tablet    esomeprazole (NEXIUM) 40 MG capsule Take 1 capsule (40 mg total) by mouth daily. 90 capsule 3   furosemide (LASIX) 20 MG tablet Take 1 tablet (20 mg total) by mouth daily. 30 tablet 5   hydrOXYzine (ATARAX/VISTARIL) 10 MG tablet Take 1 tablet (10 mg total) by mouth every 8 (eight) hours as needed. itching 30 tablet 0   Multiple Vitamins-Minerals (MULTIVITAMIN WITH MINERALS) tablet Take 1 tablet by mouth daily.     simvastatin (ZOCOR) 20 MG tablet Take 1 tablet (20 mg total) by mouth at bedtime. 90 tablet 0   telmisartan (MICARDIS) 40 MG tablet Take 1 tablet (40 mg total) by mouth daily. 30 tablet 3   traZODone (DESYREL) 50 MG tablet Take 0.5-1 tablets (25-50 mg total) by mouth at bedtime as needed for sleep. 30 tablet 3   Vitamin D, Ergocalciferol, (DRISDOL) 1.25 MG (50000 UNIT) CAPS capsule Take 1 capsule (50,000 Units total) by mouth every 7 (seven) days. 5 capsule 5   amLODipine (NORVASC) 5 MG tablet Take 1 tablet (5 mg total) by mouth daily. 90 tablet 1   No facility-administered medications prior to visit.    No Known Allergies  ROS Review of Systems  Constitutional:  Negative for chills and fever.  HENT:  Negative for congestion, sinus pressure, sinus pain and sore throat.   Eyes:  Negative for pain and discharge.  Respiratory:  Negative for cough and shortness of breath.   Cardiovascular:  Negative for chest pain and palpitations.  Gastrointestinal:  Negative for abdominal pain, constipation, diarrhea, nausea and vomiting.  Endocrine: Negative for polydipsia and polyuria.  Genitourinary:  Negative for dysuria and hematuria.  Musculoskeletal:   Negative for neck pain and neck stiffness.  Skin:  Negative for rash.  Neurological:  Negative for dizziness and weakness.  Psychiatric/Behavioral:  Negative for agitation and behavioral problems.      Objective:    Physical Exam Vitals reviewed.  Constitutional:      General: She is not in acute distress.    Appearance: She is obese. She is not diaphoretic.  HENT:     Head: Normocephalic and atraumatic.     Nose: Nose normal.  Mouth/Throat:     Mouth: Mucous membranes are moist.  Eyes:     General: No scleral icterus.    Extraocular Movements: Extraocular movements intact.  Cardiovascular:     Rate and Rhythm: Normal rate and regular rhythm.     Pulses: Normal pulses.     Heart sounds: Normal heart sounds. No murmur heard. Pulmonary:     Breath sounds: Normal breath sounds. No wheezing or rales.  Abdominal:     Palpations: Abdomen is soft.     Tenderness: There is no abdominal tenderness.  Musculoskeletal:     Cervical back: Neck supple. No tenderness.     Right lower leg: No edema.     Left lower leg: No edema.  Skin:    General: Skin is warm.     Findings: No rash.  Neurological:     General: No focal deficit present.     Mental Status: She is alert and oriented to person, place, and time.     Cranial Nerves: No cranial nerve deficit.     Sensory: No sensory deficit.     Motor: No weakness.  Psychiatric:        Mood and Affect: Mood normal.        Behavior: Behavior normal.    BP (!) 162/70 (BP Location: Right Arm, Cuff Size: Normal)   Pulse 89   Temp 98.8 F (37.1 C) (Oral)   Resp 18   Ht 5' 5"  (1.651 m)   Wt 201 lb 0.6 oz (91.2 kg)   SpO2 100%   BMI 33.45 kg/m  Wt Readings from Last 3 Encounters:  11/25/20 201 lb 0.6 oz (91.2 kg)  07/22/20 198 lb 1.3 oz (89.8 kg)  06/02/20 199 lb 6.4 oz (90.4 kg)     Health Maintenance Due  Topic Date Due   COVID-19 Vaccine (3 - Booster for Pfizer series) 06/26/2019   INFLUENZA VACCINE  08/30/2020     There are no preventive care reminders to display for this patient.  Lab Results  Component Value Date   TSH 1.240 07/13/2020   Lab Results  Component Value Date   WBC 7.9 07/13/2020   HGB 12.4 07/13/2020   HCT 37.4 07/13/2020   MCV 88 07/13/2020   PLT 236 07/13/2020   Lab Results  Component Value Date   NA 142 07/13/2020   K 4.6 07/13/2020   CO2 21 07/13/2020   GLUCOSE 82 07/13/2020   BUN 14 07/13/2020   CREATININE 1.23 (H) 07/13/2020   BILITOT 0.3 07/13/2020   ALKPHOS 81 07/13/2020   AST 19 07/13/2020   ALT 17 07/13/2020   PROT 7.1 07/13/2020   ALBUMIN 4.2 07/13/2020   CALCIUM 9.7 07/13/2020   ANIONGAP 7 04/10/2018   EGFR 47 (L) 07/13/2020   Lab Results  Component Value Date   CHOL 156 07/13/2020   Lab Results  Component Value Date   HDL 37 (L) 07/13/2020   Lab Results  Component Value Date   LDLCALC 96 07/13/2020   Lab Results  Component Value Date   TRIG 127 07/13/2020   Lab Results  Component Value Date   CHOLHDL 4.2 07/13/2020   Lab Results  Component Value Date   HGBA1C 6.0 (H) 07/13/2020      Assessment & Plan:   Problem List Items Addressed This Visit       Cardiovascular and Mediastinum   Essential hypertension - Primary    BP Readings from Last 1 Encounters:  11/25/20 (!) 162/70  uncontrolled with Amlodipine 5 mg QD and Telmisartan 40 mg QD Increased dose of Amlodipine to 10 mg QD Has underlying CKD Counseled for compliance with the medications Advised DASH diet and moderate exercise/walking, at least 150 mins/week      Relevant Medications   amLODipine (NORVASC) 10 MG tablet   Misc. Devices MISC     Genitourinary   CKD (chronic kidney disease) stage 3, GFR 30-59 ml/min (HCC)    BMP reviewed, CKD stage 3a, stable No proteinuria On Lasix and Telmisartan Check CMP Avoid nephrotoxic agents      Relevant Orders   CMP14+EGFR   Urinalysis     Other   Vitamin D deficiency    Last vitamin D Lab Results  Component  Value Date   VD25OH 6.5 (L) 07/13/2020  Advised to start Vitamin D 50,000 IU once every week      Prediabetes    Lab Results  Component Value Date   HGBA1C 6.0 (H) 07/13/2020  Advised to follow DASH diet      Relevant Orders   HgB A1c    Meds ordered this encounter  Medications   amLODipine (NORVASC) 10 MG tablet    Sig: Take 1 tablet (10 mg total) by mouth daily.    Dispense:  90 tablet    Refill:  1    Dose change   Misc. Devices MISC    Sig: BP cuff/divide - ICD10: I10    Dispense:  1 each    Refill:  0    Follow-up: Return in about 2 months (around 01/25/2021) for HTN and CKD.    Lindell Spar, MD

## 2020-11-29 ENCOUNTER — Other Ambulatory Visit: Payer: Self-pay | Admitting: *Deleted

## 2020-11-29 DIAGNOSIS — I10 Essential (primary) hypertension: Secondary | ICD-10-CM

## 2020-11-29 MED ORDER — TELMISARTAN 40 MG PO TABS: 40.0000 mg | ORAL_TABLET | Freq: Every day | ORAL | 3 refills | Status: DC

## 2021-01-18 DIAGNOSIS — R7303 Prediabetes: Secondary | ICD-10-CM | POA: Diagnosis not present

## 2021-01-18 DIAGNOSIS — N1831 Chronic kidney disease, stage 3a: Secondary | ICD-10-CM | POA: Diagnosis not present

## 2021-01-18 DIAGNOSIS — E782 Mixed hyperlipidemia: Secondary | ICD-10-CM | POA: Diagnosis not present

## 2021-01-19 LAB — URINALYSIS
Bilirubin, UA: NEGATIVE
Glucose, UA: NEGATIVE
Ketones, UA: NEGATIVE
Nitrite, UA: NEGATIVE
Protein,UA: NEGATIVE
RBC, UA: NEGATIVE
Specific Gravity, UA: 1.011 (ref 1.005–1.030)
Urobilinogen, Ur: 0.2 mg/dL (ref 0.2–1.0)
pH, UA: 6 (ref 5.0–7.5)

## 2021-01-19 LAB — CMP14+EGFR
ALT: 21 IU/L (ref 0–32)
AST: 22 IU/L (ref 0–40)
Albumin/Globulin Ratio: 1.7 (ref 1.2–2.2)
Albumin: 4.4 g/dL (ref 3.7–4.7)
Alkaline Phosphatase: 87 IU/L (ref 44–121)
BUN/Creatinine Ratio: 14 (ref 12–28)
BUN: 21 mg/dL (ref 8–27)
Bilirubin Total: 0.3 mg/dL (ref 0.0–1.2)
CO2: 21 mmol/L (ref 20–29)
Calcium: 9.9 mg/dL (ref 8.7–10.3)
Chloride: 106 mmol/L (ref 96–106)
Creatinine, Ser: 1.46 mg/dL — ABNORMAL HIGH (ref 0.57–1.00)
Globulin, Total: 2.6 g/dL (ref 1.5–4.5)
Glucose: 102 mg/dL — ABNORMAL HIGH (ref 70–99)
Potassium: 4.8 mmol/L (ref 3.5–5.2)
Sodium: 141 mmol/L (ref 134–144)
Total Protein: 7 g/dL (ref 6.0–8.5)
eGFR: 38 mL/min/{1.73_m2} — ABNORMAL LOW (ref >59)

## 2021-01-19 LAB — HEMOGLOBIN A1C
Est. average glucose Bld gHb Est-mCnc: 126 mg/dL
Hgb A1c MFr Bld: 6 % — ABNORMAL HIGH (ref 4.8–5.6)

## 2021-01-25 ENCOUNTER — Ambulatory Visit: Payer: Medicare PPO | Admitting: Internal Medicine

## 2021-02-03 ENCOUNTER — Other Ambulatory Visit: Payer: Self-pay

## 2021-02-03 ENCOUNTER — Ambulatory Visit: Payer: Medicare PPO | Admitting: Internal Medicine

## 2021-02-03 ENCOUNTER — Encounter: Payer: Self-pay | Admitting: Internal Medicine

## 2021-02-03 VITALS — BP 152/88 | HR 75 | Resp 18 | Ht 65.0 in | Wt 199.1 lb

## 2021-02-03 DIAGNOSIS — I10 Essential (primary) hypertension: Secondary | ICD-10-CM

## 2021-02-03 DIAGNOSIS — N1832 Chronic kidney disease, stage 3b: Secondary | ICD-10-CM

## 2021-02-03 MED ORDER — HYDRALAZINE HCL 25 MG PO TABS: 25.0000 mg | ORAL_TABLET | Freq: Two times a day (BID) | ORAL | 2 refills | Status: DC

## 2021-02-03 NOTE — Progress Notes (Signed)
Established Patient Office Visit  Subjective:  Patient ID: Robin Villa, female    DOB: [birth year]  Age: 71 y.o. MRN: [id number]  CC:  Chief Complaint  Patient presents with   Follow-up    2 month follow up CKD HTN     HPI Robin Villa is a 71 y.o. female with past medical history of HTN, CKD stage 3, GERD, OA of knee and insomnia who presents for f/u of her chronic medical conditions.  HTN: Her BP was elevated in the office today upon multiple measurements.  She has started taking increased dose of Amlodipine. She has been having ankle swelling, but it improves with leg elevation. She denies any headache, dizziness, chest pain, dyspnea or palpitations.  CKD stage III: Her BMP shows decrease in GFR. She denies any dysuria or hematuria.  Denies any urinary hesitance or resistance. She admits that she needs to improve fluid intake.  Past Medical History:  Diagnosis Date   Arthritis    Heart murmur    asymptomatic   Hypercholesteremia    Hypertension     Past Surgical History:  Procedure Laterality Date   CARPAL TUNNEL RELEASE     bilateral    CATARACT EXTRACTION W/PHACO Right 09/07/2014   Procedure: CATARACT EXTRACTION PHACO AND INTRAOCULAR LENS PLACEMENT RIGHT EYE CDE=6.10;  Surgeon: Tonny Branch, MD;  Location: [facility];  Service: Ophthalmology;  Laterality: Right;   COLONOSCOPY  09/15/2011   Procedure: COLONOSCOPY;  Surgeon: Danie Binder, MD;  Location: [facility];  Service: Endoscopy;  Laterality: N/A;  11:00AM   TOTAL KNEE ARTHROPLASTY Right 04/09/2018   Procedure: TOTAL KNEE ARTHROPLASTY;  Surgeon: Carole Civil, MD;  Location: [facility];  Service: Orthopedics;  Laterality: Right;   TUBAL LIGATION      Family History  Problem Relation Age of Onset   Heart disease Father    Stroke Father    Hypertension Sister    Hypertension Sister    Heart disease Brother    Hypertension Mother     Social History   Socioeconomic History   Marital status: Widowed    Spouse  name: Not on file   Number of children: 3   Years of education: Not on file   Highest education level: Not on file  Occupational History   Occupation: retired, Psychiatrist: [employer]  Tobacco Use   Smoking status: Former    Packs/day: 0.30    Years: 35.00    Pack years: 10.50    Types: Cigarettes    Quit date: 08/22/1996    Years since quitting: 24.4   Smokeless tobacco: Never  Vaping Use   Vaping Use: Never used  Substance and Sexual Activity   Alcohol use: No   Drug use: No   Sexual activity: Yes    Birth control/protection: Post-menopausal  Other Topics Concern   Not on file  Social History Narrative   Not on file   Social Determinants of Health   Financial Resource Strain: Low Risk    Difficulty of Paying Living Expenses: Not hard at all  Food Insecurity: No Food Insecurity   Worried About Charity fundraiser in the Last Year: Never true   [place] in the Last Year: Never true  Transportation Needs: No Transportation Needs   Lack of Transportation (Medical): No   Lack of Transportation (Non-Medical): No  Physical Activity: Inactive   Days of Exercise per Week: 0 days  Minutes of Exercise per Session: 0 min  Stress: No Stress Concern Present   Feeling of Stress : Not at all  Social Connections: Moderately Isolated   Frequency of Communication with Friends and Family: More than three times a week   Frequency of Social Gatherings with Friends and Family: More than three times a week   Attends Religious Services: More than 4 times per year   Active Member of [employer] or Organizations: No   Attends Archivist Meetings: Never   Marital Status: Widowed  Human resources officer Violence: Not At Risk   Fear of Current or Ex-Partner: No   Emotionally Abused: No   Physically Abused: No   Sexually Abused: No    Outpatient Medications Prior to Visit  Medication Sig Dispense Refill   amLODipine (NORVASC) 10 MG tablet Take 1 tablet (10 mg  total) by mouth daily. 90 tablet 1   aspirin 81 MG tablet Take 1 tablet (81 mg total) by mouth daily. 30 tablet    esomeprazole (NEXIUM) 40 MG capsule Take 1 capsule (40 mg total) by mouth daily. 90 capsule 3   furosemide (LASIX) 20 MG tablet Take 1 tablet (20 mg total) by mouth daily. 30 tablet 5   hydrOXYzine (ATARAX/VISTARIL) 10 MG tablet Take 1 tablet (10 mg total) by mouth every 8 (eight) hours as needed. itching 30 tablet 0   Misc. Devices MISC BP cuff/divide - ICD10: I10 1 each 0   Multiple Vitamins-Minerals (MULTIVITAMIN WITH MINERALS) tablet Take 1 tablet by mouth daily.     simvastatin (ZOCOR) 20 MG tablet Take 1 tablet (20 mg total) by mouth at bedtime. 90 tablet 0   telmisartan (MICARDIS) 40 MG tablet Take 1 tablet (40 mg total) by mouth daily. 30 tablet 3   traZODone (DESYREL) 50 MG tablet Take 0.5-1 tablets (25-50 mg total) by mouth at bedtime as needed for sleep. 30 tablet 3   Vitamin D, Ergocalciferol, (DRISDOL) 1.25 MG (50000 UNIT) CAPS capsule Take 1 capsule (50,000 Units total) by mouth every 7 (seven) days. 5 capsule 5   No facility-administered medications prior to visit.    No Known Allergies  ROS Review of Systems  Constitutional:  Negative for chills and fever.  HENT:  Negative for congestion, sinus pressure, sinus pain and sore throat.   Eyes:  Negative for pain and discharge.  Respiratory:  Negative for cough and shortness of breath.   Cardiovascular:  Negative for chest pain and palpitations.  Gastrointestinal:  Negative for abdominal pain, constipation, diarrhea, nausea and vomiting.  Endocrine: Negative for polydipsia and polyuria.  Genitourinary:  Negative for dysuria and hematuria.  Musculoskeletal:  Negative for neck pain and neck stiffness.  Skin:  Negative for rash.  Neurological:  Negative for dizziness and weakness.  Psychiatric/Behavioral:  Negative for agitation and behavioral problems.      Objective:    Physical Exam Vitals reviewed.   Constitutional:      General: She is not in acute distress.    Appearance: She is obese. She is not diaphoretic.  HENT:     Head: Normocephalic and atraumatic.     Nose: Nose normal.     Mouth/Throat:     Mouth: Mucous membranes are moist.  Eyes:     General: No scleral icterus.    Extraocular Movements: Extraocular movements intact.  Cardiovascular:     Rate and Rhythm: Normal rate and regular rhythm.     Pulses: Normal pulses.     Heart sounds: Normal heart  sounds. No murmur heard. Pulmonary:     Breath sounds: Normal breath sounds. No wheezing or rales.  Musculoskeletal:     Cervical back: Neck supple. No tenderness.     Right lower leg: No edema.     Left lower leg: No edema.  Skin:    General: Skin is warm.     Findings: No rash.  Neurological:     General: No focal deficit present.     Mental Status: She is alert and oriented to person, place, and time.     Cranial Nerves: No cranial nerve deficit.     Sensory: No sensory deficit.     Motor: No weakness.  Psychiatric:        Mood and Affect: Mood normal.        Behavior: Behavior normal.    BP (!) 152/88 (BP Location: Left Arm, Cuff Size: Normal)    Pulse 75    Resp 18    Ht 5' 5"  (1.651 m)    Wt 199 lb 1.9 oz (90.3 kg)    SpO2 99%    BMI 33.14 kg/m  Wt Readings from Last 3 Encounters:  02/03/21 199 lb 1.9 oz (90.3 kg)  11/25/20 201 lb 0.6 oz (91.2 kg)  07/22/20 198 lb 1.3 oz (89.8 kg)    Lab Results  Component Value Date   TSH 1.240 07/13/2020   Lab Results  Component Value Date   WBC 7.9 07/13/2020   HGB 12.4 07/13/2020   HCT 37.4 07/13/2020   MCV 88 07/13/2020   PLT 236 07/13/2020   Lab Results  Component Value Date   NA 141 01/18/2021   K 4.8 01/18/2021   CO2 21 01/18/2021   GLUCOSE 102 (H) 01/18/2021   BUN 21 01/18/2021   CREATININE 1.46 (H) 01/18/2021   BILITOT 0.3 01/18/2021   ALKPHOS 87 01/18/2021   AST 22 01/18/2021   ALT 21 01/18/2021   PROT 7.0 01/18/2021   ALBUMIN 4.4 01/18/2021    CALCIUM 9.9 01/18/2021   ANIONGAP 7 04/10/2018   EGFR 38 (L) 01/18/2021   Lab Results  Component Value Date   CHOL 156 07/13/2020   Lab Results  Component Value Date   HDL 37 (L) 07/13/2020   Lab Results  Component Value Date   LDLCALC 96 07/13/2020   Lab Results  Component Value Date   TRIG 127 07/13/2020   Lab Results  Component Value Date   CHOLHDL 4.2 07/13/2020   Lab Results  Component Value Date   HGBA1C 6.0 (H) 01/18/2021      Assessment & Plan:   Problem List Items Addressed This Visit       Cardiovascular and Mediastinum   Essential hypertension    BP Readings from Last 1 Encounters:  02/03/21 (!) 152/88  uncontrolled with Amlodipine 10 mg QD and Telmisartan 40 mg QD Added Hydralazine 25 mg BID Has underlying CKD Counseled for compliance with the medications Advised DASH diet and moderate exercise/walking as tolerated      Relevant Medications   hydrALAZINE (APRESOLINE) 25 MG tablet     Genitourinary   CKD (chronic kidney disease) stage 3, GFR 30-59 ml/min (HCC) - Primary    BMP reviewed, CKD stage 3b, worse than prior - needs to improve hydration No proteinuria On Lasix and Telmisartan Check BMP before next visit  Avoid nephrotoxic agents      Relevant Orders   Basic Metabolic Panel (BMET)   US Renal    Meds ordered this  encounter  Medications   hydrALAZINE (APRESOLINE) 25 MG tablet    Sig: Take 1 tablet (25 mg total) by mouth 2 (two) times daily.    Dispense:  60 tablet    Refill:  2    Follow-up: Return in about 3 months (around 05/04/2021) for HTN and CKD.    Lindell Spar, MD

## 2021-02-03 NOTE — Assessment & Plan Note (Signed)
BMP reviewed, CKD stage 3b, worse than prior - needs to improve hydration No proteinuria On Lasix and Telmisartan Check BMP before next visit  Avoid nephrotoxic agents

## 2021-02-03 NOTE — Assessment & Plan Note (Signed)
BP Readings from Last 1 Encounters:  02/03/21 (!) 152/88   uncontrolled with Amlodipine 10 mg QD and Telmisartan 40 mg QD Added Hydralazine 25 mg BID Has underlying CKD Counseled for compliance with the medications Advised DASH diet and moderate exercise/walking as tolerated

## 2021-02-03 NOTE — Patient Instructions (Addendum)
Please start taking Hydralazine as prescribed.  Please increase fluid intake to at least 64 ounces in a day.  You are being scheduled to get [place] of kidney done at [facility].  Please follow DASH diet and ambulate as tolerated.  Please try leg elevation for leg swelling. Okay to use compression socks for leg swelling.

## 2021-02-04 ENCOUNTER — Other Ambulatory Visit: Payer: Self-pay | Admitting: *Deleted

## 2021-02-04 MED ORDER — ESOMEPRAZOLE MAGNESIUM 40 MG PO CPDR: 40.0000 mg | DELAYED_RELEASE_CAPSULE | Freq: Every day | ORAL | 3 refills | Status: DC

## 2021-02-09 ENCOUNTER — Other Ambulatory Visit: Payer: Self-pay | Admitting: Internal Medicine

## 2021-02-09 DIAGNOSIS — R600 Localized edema: Secondary | ICD-10-CM

## 2021-02-10 ENCOUNTER — Other Ambulatory Visit: Payer: Self-pay | Admitting: Internal Medicine

## 2021-02-10 ENCOUNTER — Telehealth: Payer: Self-pay | Admitting: Internal Medicine

## 2021-02-10 DIAGNOSIS — K219 Gastro-esophageal reflux disease without esophagitis: Secondary | ICD-10-CM

## 2021-02-10 MED ORDER — PANTOPRAZOLE SODIUM 40 MG PO TBEC: 40.0000 mg | DELAYED_RELEASE_TABLET | Freq: Two times a day (BID) | ORAL | 3 refills | Status: DC

## 2021-02-10 MED ORDER — SUCRALFATE 1 G PO TABS: 1.0000 g | ORAL_TABLET | Freq: Three times a day (TID) | ORAL | 2 refills | Status: DC

## 2021-02-10 NOTE — Telephone Encounter (Signed)
Pt called in regard to antacid   Pt states that current antacid cost is too high Pt would like new prescription for a cheaper antacid

## 2021-02-10 NOTE — Telephone Encounter (Signed)
Please advise nexium was sent for pt

## 2021-02-11 ENCOUNTER — Other Ambulatory Visit: Payer: Self-pay

## 2021-02-11 ENCOUNTER — Ambulatory Visit (HOSPITAL_COMMUNITY)
Admission: RE | Admit: 2021-02-11 | Discharge: 2021-02-11 | Disposition: A | Discharge status: Home / Self Care | Payer: Medicare PPO | Source: Ambulatory Visit | Attending: Internal Medicine | Admitting: Internal Medicine

## 2021-02-11 DIAGNOSIS — N1832 Chronic kidney disease, stage 3b: Secondary | ICD-10-CM | POA: Insufficient documentation

## 2021-02-11 DIAGNOSIS — N189 Chronic kidney disease, unspecified: Secondary | ICD-10-CM | POA: Diagnosis not present

## 2021-02-11 NOTE — Telephone Encounter (Signed)
Pt advised with verbal understanding  °

## 2021-02-17 ENCOUNTER — Telehealth: Payer: Self-pay | Admitting: Internal Medicine

## 2021-02-17 NOTE — Telephone Encounter (Signed)
Pt stated when taking the hydralazine she feels a burning sensation on right thigh every now and then it was itching but not now please advise

## 2021-02-17 NOTE — Telephone Encounter (Signed)
Pt advised with verbal understanding  °

## 2021-02-17 NOTE — Telephone Encounter (Signed)
Pt called in regard to   hydrALAZINE (APRESOLINE) 25 MG tablet   Pt states that med is not agreeing with her and pt wants to speak with nurse in regard

## 2021-02-24 DIAGNOSIS — H25812 Combined forms of age-related cataract, left eye: Secondary | ICD-10-CM | POA: Diagnosis not present

## 2021-04-27 ENCOUNTER — Other Ambulatory Visit: Payer: Self-pay | Admitting: Internal Medicine

## 2021-04-28 ENCOUNTER — Ambulatory Visit: Payer: Medicare PPO | Admitting: Orthopedic Surgery

## 2021-04-28 ENCOUNTER — Ambulatory Visit: Payer: Medicare PPO

## 2021-04-28 DIAGNOSIS — Z96651 Presence of right artificial knee joint: Secondary | ICD-10-CM | POA: Diagnosis not present

## 2021-04-28 NOTE — Progress Notes (Signed)
Huel Cote ? ?Chief Complaint  ?Patient presents with  ? s/p RIGHT TKA  ?  DOS 04/09/2018  ? ? ?Encounter Diagnosis  ?Name Primary?  ? S/P TKR (total knee replacement), right Yes  ? ? ?Postop 3 years no complaints ? ?The knee is functioning well it moves well it stable ? ?The x-rays look good ? ?Follow-up in 2 years ?

## 2021-05-05 ENCOUNTER — Ambulatory Visit: Payer: Medicare PPO | Admitting: Internal Medicine

## 2021-05-13 ENCOUNTER — Other Ambulatory Visit: Payer: Self-pay | Admitting: Internal Medicine

## 2021-05-13 DIAGNOSIS — I10 Essential (primary) hypertension: Secondary | ICD-10-CM

## 2021-05-24 ENCOUNTER — Other Ambulatory Visit: Payer: Self-pay | Admitting: Internal Medicine

## 2021-05-24 DIAGNOSIS — R600 Localized edema: Secondary | ICD-10-CM

## 2021-05-26 ENCOUNTER — Other Ambulatory Visit: Payer: Self-pay | Admitting: Internal Medicine

## 2021-05-26 DIAGNOSIS — I10 Essential (primary) hypertension: Secondary | ICD-10-CM

## 2021-05-30 DIAGNOSIS — N1832 Chronic kidney disease, stage 3b: Secondary | ICD-10-CM | POA: Diagnosis not present

## 2021-05-31 LAB — BASIC METABOLIC PANEL
BUN/Creatinine Ratio: 14 (ref 12–28)
BUN: 19 mg/dL (ref 8–27)
CO2: 20 mmol/L (ref 20–29)
Calcium: 10 mg/dL (ref 8.7–10.3)
Chloride: 108 mmol/L — ABNORMAL HIGH (ref 96–106)
Creatinine, Ser: 1.35 mg/dL — ABNORMAL HIGH (ref 0.57–1.00)
Glucose: 102 mg/dL — ABNORMAL HIGH (ref 70–99)
Potassium: 4.7 mmol/L (ref 3.5–5.2)
Sodium: 140 mmol/L (ref 134–144)
eGFR: 41 mL/min/{1.73_m2} — ABNORMAL LOW (ref >59)

## 2021-06-08 ENCOUNTER — Ambulatory Visit: Payer: Medicare PPO | Admitting: Internal Medicine

## 2021-06-13 ENCOUNTER — Ambulatory Visit: Payer: Medicare PPO | Admitting: Internal Medicine

## 2021-06-13 ENCOUNTER — Encounter: Payer: Self-pay | Admitting: Internal Medicine

## 2021-06-13 VITALS — BP 138/84 | HR 76 | Resp 18 | Ht 65.0 in | Wt 200.2 lb

## 2021-06-13 DIAGNOSIS — K219 Gastro-esophageal reflux disease without esophagitis: Secondary | ICD-10-CM | POA: Diagnosis not present

## 2021-06-13 DIAGNOSIS — R7303 Prediabetes: Secondary | ICD-10-CM

## 2021-06-13 DIAGNOSIS — N1832 Chronic kidney disease, stage 3b: Secondary | ICD-10-CM

## 2021-06-13 DIAGNOSIS — E782 Mixed hyperlipidemia: Secondary | ICD-10-CM

## 2021-06-13 DIAGNOSIS — E559 Vitamin D deficiency, unspecified: Secondary | ICD-10-CM

## 2021-06-13 DIAGNOSIS — R600 Localized edema: Secondary | ICD-10-CM

## 2021-06-13 DIAGNOSIS — I10 Essential (primary) hypertension: Secondary | ICD-10-CM | POA: Diagnosis not present

## 2021-06-13 MED ORDER — FUROSEMIDE 20 MG PO TABS: 20.0000 mg | ORAL_TABLET | Freq: Every day | ORAL | 1 refills | Status: DC

## 2021-06-13 MED ORDER — HYDRALAZINE HCL 25 MG PO TABS: 25.0000 mg | ORAL_TABLET | Freq: Two times a day (BID) | ORAL | 1 refills | Status: DC

## 2021-06-13 MED ORDER — AMLODIPINE BESYLATE 10 MG PO TABS: 10.0000 mg | ORAL_TABLET | Freq: Every day | ORAL | 1 refills | Status: DC

## 2021-06-13 MED ORDER — PANTOPRAZOLE SODIUM 40 MG PO TBEC: 40.0000 mg | DELAYED_RELEASE_TABLET | Freq: Every day | ORAL | 1 refills | Status: DC

## 2021-06-13 NOTE — Patient Instructions (Addendum)
Please continue taking medications as prescribed. ? ?Please get Echo done as scheduled. ? ?Please follow low salt diet and ambulate as tolerated. ? ?Okay to use compression socks for leg swelling. ? ?Please get fasting blood tests done before the next visit. ?

## 2021-06-13 NOTE — Progress Notes (Signed)
Established Patient Office Visit  Subjective:  Patient ID: Robin Villa, female    DOB: February 20, 1950  Age: 71 y.o. MRN: [id number]  CC:  Chief Complaint  Patient presents with   Follow-up    Follow up pt thinks bp medication is causing her legs to swell not sure if its amlodipine or hydralazine they go down at night     HPI Robin Villa is a 71 y.o. female with past medical history of HTN, CKD stage 3, GERD, OA of knee and insomnia who presents for f/u of her chronic medical conditions.  HTN: BP is well-controlled. Takes medications regularly. Patient denies headache, dizziness, chest pain, or palpitations.  She currently takes amlodipine and hydralazine, and is concerned if they are causing leg swelling.  She has mild bilateral leg swelling, which is worse in the evening and improves in the morning with leg elevation.  CKD stage III: Her BMP shows stable GFR. She denies any dysuria or hematuria.  Denies any urinary hesitance or resistance. She admits that she needs to improve fluid intake.  Past Medical History:  Diagnosis Date   Arthritis    Heart murmur    asymptomatic   Hypercholesteremia    Hypertension     Past Surgical History:  Procedure Laterality Date   CARPAL TUNNEL RELEASE     bilateral    CATARACT EXTRACTION W/PHACO Right 09/07/2014   Procedure: CATARACT EXTRACTION PHACO AND INTRAOCULAR LENS PLACEMENT RIGHT EYE CDE=6.10;  Surgeon: Tonny Branch, MD;  Location: [facility];  Service: Ophthalmology;  Laterality: Right;   COLONOSCOPY  09/15/2011   Procedure: COLONOSCOPY;  Surgeon: Danie Binder, MD;  Location: [facility];  Service: Endoscopy;  Laterality: N/A;  11:00AM   TOTAL KNEE ARTHROPLASTY Right 04/09/2018   Procedure: TOTAL KNEE ARTHROPLASTY;  Surgeon: Carole Civil, MD;  Location: [facility];  Service: Orthopedics;  Laterality: Right;   TUBAL LIGATION      Family History  Problem Relation Age of Onset   Heart disease Father    Stroke Father    Hypertension Sister     Hypertension Sister    Heart disease Brother    Hypertension Mother     Social History   Socioeconomic History   Marital status: Widowed    Spouse name: Not on file   Number of children: 3   Years of education: Not on file   Highest education level: Not on file  Occupational History   Occupation: retired, Psychiatrist: [employer]  Tobacco Use   Smoking status: Former    Packs/day: 0.30    Years: 35.00    Pack years: 10.50    Types: Cigarettes    Quit date: 08/22/1996    Years since quitting: 24.8   Smokeless tobacco: Never  Vaping Use   Vaping Use: Never used  Substance and Sexual Activity   Alcohol use: No   Drug use: No   Sexual activity: Yes    Birth control/protection: Post-menopausal  Other Topics Concern   Not on file  Social History Narrative   Not on file   Social Determinants of Health   Financial Resource Strain: Low Risk    Difficulty of Paying Living Expenses: Not hard at all  Food Insecurity: No Food Insecurity   Worried About Charity fundraiser in the Last Year: Never true   [place] in the Last Year: Never true  Transportation Needs: No Transportation Needs  Lack of Transportation (Medical): No   Lack of Transportation (Non-Medical): No  Physical Activity: Inactive   Days of Exercise per Week: 0 days   Minutes of Exercise per Session: 0 min  Stress: No Stress Concern Present   Feeling of Stress : Not at all  Social Connections: Moderately Isolated   Frequency of Communication with Friends and Family: More than three times a week   Frequency of Social Gatherings with Friends and Family: More than three times a week   Attends Religious Services: More than 4 times per year   Active Member of [employer] or Organizations: No   Attends Archivist Meetings: Never   Marital Status: Widowed  Human resources officer Violence: Not At Risk   Fear of Current or Ex-Partner: No   Emotionally Abused: No   Physically Abused: No    Sexually Abused: No    Outpatient Medications Prior to Visit  Medication Sig Dispense Refill   aspirin 81 MG tablet Take 1 tablet (81 mg total) by mouth daily. 30 tablet    hydrOXYzine (ATARAX/VISTARIL) 10 MG tablet Take 1 tablet (10 mg total) by mouth every 8 (eight) hours as needed. itching 30 tablet 0   Misc. Devices MISC BP cuff/divide - ICD10: I10 1 each 0   Multiple Vitamins-Minerals (MULTIVITAMIN WITH MINERALS) tablet Take 1 tablet by mouth daily.     simvastatin (ZOCOR) 20 MG tablet TAKE 1 TABLET BY MOUTH AT BEDTIME 90 tablet 0   sucralfate (CARAFATE) 1 g tablet Take 1 tablet (1 g total) by mouth 4 (four) times daily -  with meals and at bedtime. 90 tablet 2   telmisartan (MICARDIS) 40 MG tablet Take 1 tablet by mouth once daily 90 tablet 1   Vitamin D, Ergocalciferol, (DRISDOL) 1.25 MG (50000 UNIT) CAPS capsule Take 1 capsule (50,000 Units total) by mouth every 7 (seven) days. 5 capsule 5   amLODipine (NORVASC) 10 MG tablet Take 1 tablet (10 mg total) by mouth daily. 90 tablet 1   furosemide (LASIX) 20 MG tablet Take 1 tablet by mouth once daily 30 tablet 0   hydrALAZINE (APRESOLINE) 25 MG tablet Take 1 tablet by mouth twice daily 60 tablet 0   pantoprazole (PROTONIX) 40 MG tablet Take 1 tablet (40 mg total) by mouth 2 (two) times daily. 60 tablet 3   traZODone (DESYREL) 50 MG tablet Take 0.5-1 tablets (25-50 mg total) by mouth at bedtime as needed for sleep. 30 tablet 3   No facility-administered medications prior to visit.    No Known Allergies  ROS Review of Systems  Constitutional:  Negative for chills and fever.  HENT:  Negative for congestion, sinus pressure, sinus pain and sore throat.   Eyes:  Negative for pain and discharge.  Respiratory:  Negative for cough and wheezing.   Cardiovascular:  Positive for leg swelling. Negative for chest pain and palpitations.  Gastrointestinal:  Negative for abdominal pain, constipation, diarrhea, nausea and vomiting.  Endocrine:  Negative for polydipsia and polyuria.  Genitourinary:  Negative for dysuria and hematuria.  Musculoskeletal:  Negative for neck pain and neck stiffness.  Skin:  Negative for rash.  Neurological:  Negative for dizziness and weakness.  Psychiatric/Behavioral:  Negative for agitation and behavioral problems.      Objective:    Physical Exam Vitals reviewed.  Constitutional:      General: She is not in acute distress.    Appearance: She is obese. She is not diaphoretic.  HENT:     Head:  Normocephalic and atraumatic.     Nose: Nose normal.     Mouth/Throat:     Mouth: Mucous membranes are moist.  Eyes:     General: No scleral icterus.    Extraocular Movements: Extraocular movements intact.  Cardiovascular:     Rate and Rhythm: Normal rate and regular rhythm.     Pulses: Normal pulses.     Heart sounds: Normal heart sounds. No murmur heard. Pulmonary:     Breath sounds: Normal breath sounds. No wheezing or rales.  Musculoskeletal:     Cervical back: Neck supple. No tenderness.     Right lower leg: No edema.     Left lower leg: No edema.  Skin:    General: Skin is warm.     Findings: No rash.  Neurological:     General: No focal deficit present.     Mental Status: She is alert and oriented to person, place, and time.     Cranial Nerves: No cranial nerve deficit.     Sensory: No sensory deficit.     Motor: No weakness.  Psychiatric:        Mood and Affect: Mood normal.        Behavior: Behavior normal.    BP 138/84 (BP Location: Right Arm, Patient Position: Sitting, Cuff Size: Normal)   Pulse 76   Resp 18   Ht 5' 5"  (1.651 m)   Wt 200 lb 3.2 oz (90.8 kg)   SpO2 98%   BMI 33.32 kg/m  Wt Readings from Last 3 Encounters:  06/13/21 200 lb 3.2 oz (90.8 kg)  02/03/21 199 lb 1.9 oz (90.3 kg)  11/25/20 201 lb 0.6 oz (91.2 kg)    Lab Results  Component Value Date   TSH 1.240 07/13/2020   Lab Results  Component Value Date   WBC 7.9 07/13/2020   HGB 12.4 07/13/2020    HCT 37.4 07/13/2020   MCV 88 07/13/2020   PLT 236 07/13/2020   Lab Results  Component Value Date   NA 140 05/30/2021   K 4.7 05/30/2021   CO2 20 05/30/2021   GLUCOSE 102 (H) 05/30/2021   BUN 19 05/30/2021   CREATININE 1.35 (H) 05/30/2021   BILITOT 0.3 01/18/2021   ALKPHOS 87 01/18/2021   AST 22 01/18/2021   ALT 21 01/18/2021   PROT 7.0 01/18/2021   ALBUMIN 4.4 01/18/2021   CALCIUM 10.0 05/30/2021   ANIONGAP 7 04/10/2018   EGFR 41 (L) 05/30/2021   Lab Results  Component Value Date   CHOL 156 07/13/2020   Lab Results  Component Value Date   HDL 37 (L) 07/13/2020   Lab Results  Component Value Date   LDLCALC 96 07/13/2020   Lab Results  Component Value Date   TRIG 127 07/13/2020   Lab Results  Component Value Date   CHOLHDL 4.2 07/13/2020   Lab Results  Component Value Date   HGBA1C 6.0 (H) 01/18/2021      Assessment & Plan:   Problem List Items Addressed This Visit       Cardiovascular and Mediastinum   Essential hypertension - Primary    BP Readings from Last 1 Encounters:  06/13/21 138/84  controlled with Amlodipine 10 mg QD, Telmisartan 40 mg QD and Hydralazine 25 mg BID Has underlying CKD Counseled for compliance with the medications Advised DASH diet and moderate exercise/walking as tolerated      Relevant Medications   amLODipine (NORVASC) 10 MG tablet   furosemide (LASIX) 20 MG  tablet   hydrALAZINE (APRESOLINE) 25 MG tablet   Other Relevant Orders   TSH   CBC with Differential/Platelet   ECHOCARDIOGRAM COMPLETE     Digestive   Gastroesophageal reflux disease without esophagitis    Well-controlled with pantoprazole       Relevant Medications   pantoprazole (PROTONIX) 40 MG tablet     Genitourinary   CKD (chronic kidney disease) stage 3, GFR 30-59 ml/min (HCC)    BMP reviewed, CKD stage 3b, stable - needs to improve hydration No proteinuria On Lasix and Telmisartan Check BMP before next visit  Avoid nephrotoxic agents        Relevant Orders   CMP14+EGFR   CBC with Differential/Platelet     Other   Hyperlipidemia    On statin Check lipid profile       Relevant Medications   amLODipine (NORVASC) 10 MG tablet   furosemide (LASIX) 20 MG tablet   hydrALAZINE (APRESOLINE) 25 MG tablet   Other Relevant Orders   Lipid panel   Vitamin D deficiency   Relevant Orders   VITAMIN D 25 Hydroxy (Vit-D Deficiency, Fractures)   Prediabetes    Lab Results  Component Value Date   HGBA1C 6.0 (H) 01/18/2021  Advised to follow DASH diet      Relevant Orders   Hemoglobin A1c   Leg edema    Chronic leg swelling with intermittent dyspnea Could be due to venous insufficiency, advised leg elevation as tolerated Will check echo to rule out HFpEF       Relevant Medications   furosemide (LASIX) 20 MG tablet   Other Relevant Orders   ECHOCARDIOGRAM COMPLETE    Meds ordered this encounter  Medications   amLODipine (NORVASC) 10 MG tablet    Sig: Take 1 tablet (10 mg total) by mouth daily.    Dispense:  90 tablet    Refill:  1   furosemide (LASIX) 20 MG tablet    Sig: Take 1 tablet (20 mg total) by mouth daily.    Dispense:  90 tablet    Refill:  1   pantoprazole (PROTONIX) 40 MG tablet    Sig: Take 1 tablet (40 mg total) by mouth daily.    Dispense:  90 tablet    Refill:  1   hydrALAZINE (APRESOLINE) 25 MG tablet    Sig: Take 1 tablet (25 mg total) by mouth 2 (two) times daily.    Dispense:  180 tablet    Refill:  1    Follow-up: Return in about 4 months (around 10/14/2021) for Annual physical.    Lindell Spar, MD

## 2021-06-16 DIAGNOSIS — R600 Localized edema: Secondary | ICD-10-CM | POA: Insufficient documentation

## 2021-06-16 NOTE — Assessment & Plan Note (Signed)
Well controlled with pantoprazole 

## 2021-06-16 NOTE — Assessment & Plan Note (Signed)
BMP reviewed, CKD stage 3b, stable - needs to improve hydration No proteinuria On Lasix and Telmisartan Check BMP before next visit  Avoid nephrotoxic agents

## 2021-06-16 NOTE — Assessment & Plan Note (Signed)
On statin Check lipid profile 

## 2021-06-16 NOTE — Assessment & Plan Note (Signed)
Lab Results  Component Value Date   HGBA1C 6.0 (H) 01/18/2021   Advised to follow DASH diet 

## 2021-06-16 NOTE — Assessment & Plan Note (Signed)
BP Readings from Last 1 Encounters:  06/13/21 138/84   controlled with Amlodipine 10 mg QD, Telmisartan 40 mg QD and Hydralazine 25 mg BID Has underlying CKD Counseled for compliance with the medications Advised DASH diet and moderate exercise/walking as tolerated

## 2021-06-16 NOTE — Assessment & Plan Note (Signed)
Chronic leg swelling with intermittent dyspnea Could be due to venous insufficiency, advised leg elevation as tolerated Will check echo to rule out HFpEF

## 2021-07-13 ENCOUNTER — Ambulatory Visit (HOSPITAL_COMMUNITY): Admission: RE | Admit: 2021-07-13 | Payer: Medicare PPO | Source: Ambulatory Visit

## 2021-08-09 ENCOUNTER — Encounter: Payer: Self-pay | Admitting: *Deleted

## 2021-08-11 ENCOUNTER — Ambulatory Visit (HOSPITAL_COMMUNITY)
Admission: RE | Admit: 2021-08-11 | Discharge: 2021-08-11 | Disposition: A | Discharge status: Home / Self Care | Payer: Medicare PPO | Source: Ambulatory Visit | Attending: Internal Medicine | Admitting: Internal Medicine

## 2021-08-11 DIAGNOSIS — I10 Essential (primary) hypertension: Secondary | ICD-10-CM | POA: Diagnosis not present

## 2021-08-11 DIAGNOSIS — R600 Localized edema: Secondary | ICD-10-CM | POA: Diagnosis not present

## 2021-08-11 LAB — ECHOCARDIOGRAM COMPLETE
AR max vel: 1.64 cm2
AV Area VTI: 1.73 cm2
AV Area mean vel: 1.54 cm2
AV Mean grad: 8 mmHg
AV Peak grad: 15.4 mmHg
Ao pk vel: 1.96 m/s
Area-P 1/2: 3.42 cm2
P 1/2 time: 781 msec
S' Lateral: 2.8 cm

## 2021-08-11 NOTE — Progress Notes (Signed)
*  PRELIMINARY RESULTS* Echocardiogram 2D Echocardiogram has been performed.  Robin Villa 08/11/2021, 3:55 PM

## 2021-08-26 ENCOUNTER — Ambulatory Visit (INDEPENDENT_AMBULATORY_CARE_PROVIDER_SITE_OTHER): Payer: Medicare PPO

## 2021-08-26 DIAGNOSIS — Z0000 Encounter for general adult medical examination without abnormal findings: Secondary | ICD-10-CM | POA: Diagnosis not present

## 2021-08-26 NOTE — Patient Instructions (Signed)
Ms. Robin Villa , Thank you for taking time to come for your Medicare Wellness Visit. I appreciate your ongoing commitment to your health goals. Please review the following plan we discussed and let me know if I can assist you in the future.   Screening recommendations/referrals: Colonoscopy: Completed Mammogram: Completed Bone Density: Completed Recommended yearly ophthalmology/optometry visit for glaucoma screening and checkup Recommended yearly dental visit for hygiene and checkup  Vaccinations: Influenza vaccine: Completed Pneumococcal vaccine: Completed Tdap vaccine: Completed Shingles vaccine: Completed  Advanced directives: Patient declined  Conditions/risks identified: Falls, hypertension  Next appointment: 1 year   Preventive Care 25 Years and Older, Female Preventive care refers to lifestyle choices and visits with your health care provider that can promote health and wellness. What does preventive care include? A yearly physical exam. This is also called an annual well check. Dental exams once or twice a year. Routine eye exams. Ask your health care provider how often you should have your eyes checked. Personal lifestyle choices, including: Daily care of your teeth and gums. Regular physical activity. Eating a healthy diet. Avoiding tobacco and drug use. Limiting alcohol use. Practicing safe sex. Taking low-dose aspirin every day. Taking vitamin and mineral supplements as recommended by your health care provider. What happens during an annual well check? The services and screenings done by your health care provider during your annual well check will depend on your age, overall health, lifestyle risk factors, and family history of disease. Counseling  Your health care provider may ask you questions about your: Alcohol use. Tobacco use. Drug use. Emotional well-being. Home and relationship well-being. Sexual activity. Eating habits. History of falls. Memory and  ability to understand (cognition). Work and work Statistician. Reproductive health. Screening  You may have the following tests or measurements: Height, weight, and BMI. Blood pressure. Lipid and cholesterol levels. These may be checked every 5 years, or more frequently if you are over 17 years old. Skin check. Lung cancer screening. You may have this screening every year starting at age 59 if you have a 30-pack-year history of smoking and currently smoke or have quit within the past 15 years. Fecal occult blood test (FOBT) of the stool. You may have this test every year starting at age 8. Flexible sigmoidoscopy or colonoscopy. You may have a sigmoidoscopy every 5 years or a colonoscopy every 10 years starting at age 8. Hepatitis C blood test. Hepatitis B blood test. Sexually transmitted disease (STD) testing. Diabetes screening. This is done by checking your blood sugar (glucose) after you have not eaten for a while (fasting). You may have this done every 1-3 years. Bone density scan. This is done to screen for osteoporosis. You may have this done starting at age 66. Mammogram. This may be done every 1-2 years. Talk to your health care provider about how often you should have regular mammograms. Talk with your health care provider about your test results, treatment options, and if necessary, the need for more tests. Vaccines  Your health care provider may recommend certain vaccines, such as: Influenza vaccine. This is recommended every year. Tetanus, diphtheria, and acellular pertussis (Tdap, Td) vaccine. You may need a Td booster every 10 years. Zoster vaccine. You may need this after age 36. Pneumococcal 13-valent conjugate (PCV13) vaccine. One dose is recommended after age 67. Pneumococcal polysaccharide (PPSV23) vaccine. One dose is recommended after age 59. Talk to your health care provider about which screenings and vaccines you need and how often you need them. This information is  not intended to replace advice given to you by your health care provider. Make sure you discuss any questions you have with your health care provider. Document Released: 02/12/2015 Document Revised: 10/06/2015 Document Reviewed: 11/17/2014 Elsevier Interactive Patient Education  2017 [place] Prevention in the Home Falls can cause injuries. They can happen to people of all ages. There are many things you can do to make your home safe and to help prevent falls. What can I do on the outside of my home? Regularly fix the edges of walkways and driveways and fix any cracks. Remove anything that might make you trip as you walk through a door, such as a raised step or threshold. Trim any bushes or trees on the path to your home. Use bright outdoor lighting. Clear any walking paths of anything that might make someone trip, such as rocks or tools. Regularly check to see if handrails are loose or broken. Make sure that both sides of any steps have handrails. Any raised decks and porches should have guardrails on the edges. Have any leaves, snow, or ice cleared regularly. Use sand or salt on walking paths during winter. Clean up any spills in your garage right away. This includes oil or grease spills. What can I do in the bathroom? Use night lights. Install grab bars by the toilet and in the tub and shower. Do not use towel bars as grab bars. Use non-skid mats or decals in the tub or shower. If you need to sit down in the shower, use a plastic, non-slip stool. Keep the floor dry. Clean up any water that spills on the floor as soon as it happens. Remove soap buildup in the tub or shower regularly. Attach bath mats securely with double-sided non-slip rug tape. Do not have throw rugs and other things on the floor that can make you trip. What can I do in the bedroom? Use night lights. Make sure that you have a light by your bed that is easy to reach. Do not use any sheets or blankets that  are too big for your bed. They should not hang down onto the floor. Have a firm chair that has side arms. You can use this for support while you get dressed. Do not have throw rugs and other things on the floor that can make you trip. What can I do in the kitchen? Clean up any spills right away. Avoid walking on wet floors. Keep items that you use a lot in easy-to-reach places. If you need to reach something above you, use a strong step stool that has a grab bar. Keep electrical cords out of the way. Do not use floor polish or wax that makes floors slippery. If you must use wax, use non-skid floor wax. Do not have throw rugs and other things on the floor that can make you trip. What can I do with my stairs? Do not leave any items on the stairs. Make sure that there are handrails on both sides of the stairs and use them. Fix handrails that are broken or loose. Make sure that handrails are as long as the stairways. Check any carpeting to make sure that it is firmly attached to the stairs. Fix any carpet that is loose or worn. Avoid having throw rugs at the top or bottom of the stairs. If you do have throw rugs, attach them to the floor with carpet tape. Make sure that you have a light switch at the top of the stairs  and the bottom of the stairs. If you do not have them, ask someone to add them for you. What else can I do to help prevent falls? Wear shoes that: Do not have high heels. Have rubber bottoms. Are comfortable and fit you well. Are closed at the toe. Do not wear sandals. If you use a stepladder: Make sure that it is fully opened. Do not climb a closed stepladder. Make sure that both sides of the stepladder are locked into place. Ask someone to hold it for you, if possible. Clearly mark and make sure that you can see: Any grab bars or handrails. First and last steps. Where the edge of each step is. Use tools that help you move around (mobility aids) if they are needed. These  include: Canes. Walkers. Scooters. Crutches. Turn on the lights when you go into a dark area. Replace any light bulbs as soon as they burn out. Set up your furniture so you have a clear path. Avoid moving your furniture around. If any of your floors are uneven, fix them. If there are any pets around you, be aware of where they are. Review your medicines with your doctor. Some medicines can make you feel dizzy. This can increase your chance of falling. Ask your doctor what other things that you can do to help prevent falls. This information is not intended to replace advice given to you by your health care provider. Make sure you discuss any questions you have with your health care provider. Document Released: 11/12/2008 Document Revised: 06/24/2015 Document Reviewed: 02/20/2014 Elsevier Interactive Patient Education  2017 [employer].

## 2021-08-26 NOTE — Progress Notes (Signed)
Subjective:   Robin Villa is a 71 y.o. female who presents for Medicare Annual (Subsequent) preventive examination.  I connected with  Robin Villa on 08/26/21 by a audio enabled telemedicine application and verified that I am speaking with the correct person using two identifiers.  Patient Location: Home  Provider Location: Office/Clinic  I discussed the limitations of evaluation and management by telemedicine. The patient expressed understanding and agreed to proceed.  Review of Systems     Robin Villa , Thank you for taking time to come for your Medicare Wellness Visit. I appreciate your ongoing commitment to your health goals. Please review the following plan we discussed and let me know if I can assist you in the future.   These are the goals we discussed:  Goals      Patient Stated     Patient would like to take  more vacations         This is a list of the screening recommended for you and due dates:  Health Maintenance  Topic Date Due   COVID-19 Vaccine (3 - Pfizer series) 06/26/2019   Flu Shot  08/30/2021   Colon Cancer Screening  09/14/2021   Mammogram  08/27/2022   Tetanus Vaccine  01/21/2023   Pneumonia Vaccine  Completed   DEXA scan (bone density measurement)  Completed   Hepatitis C Screening: USPSTF Recommendation to screen - Ages 73-79 yo.  Completed   Zoster (Shingles) Vaccine  Completed   HPV Vaccine  Aged Out          Objective:    There were no vitals filed for this visit. There is no height or weight on file to calculate BMI.     08/24/2020    9:54 AM 06/02/2019    3:02 PM 04/09/2018   12:30 PM 04/09/2018    6:25 AM 04/04/2018   11:08 AM 12/27/2016    1:47 PM 09/07/2014    7:35 AM  Advanced Directives  Does Patient Have a Medical Advance Directive? No No No  Yes No No  Type of Advance Directive     Living will    Does patient want to make changes to medical advance directive?   No - Patient declined No - Patient declined No - Patient declined     Would patient like information on creating a medical advance directive? Yes (MAU/Ambulatory/Procedural Areas - Information given) Yes (MAU/Ambulatory/Procedural Areas - Information given) No - Patient declined   Yes (Inpatient - patient defers creating a medical advance directive at this time) No - patient declined information    Current Medications (verified) Outpatient Encounter Medications as of 08/26/2021  Medication Sig   amLODipine (NORVASC) 10 MG tablet Take 1 tablet (10 mg total) by mouth daily.   aspirin 81 MG tablet Take 1 tablet (81 mg total) by mouth daily.   furosemide (LASIX) 20 MG tablet Take 1 tablet (20 mg total) by mouth daily.   hydrALAZINE (APRESOLINE) 25 MG tablet Take 1 tablet (25 mg total) by mouth 2 (two) times daily.   hydrOXYzine (ATARAX/VISTARIL) 10 MG tablet Take 1 tablet (10 mg total) by mouth every 8 (eight) hours as needed. itching   Misc. Devices MISC BP cuff/divide - ICD10: I10   Multiple Vitamins-Minerals (MULTIVITAMIN WITH MINERALS) tablet Take 1 tablet by mouth daily.   pantoprazole (PROTONIX) 40 MG tablet Take 1 tablet (40 mg total) by mouth daily.   simvastatin (ZOCOR) 20 MG tablet TAKE 1 TABLET BY MOUTH AT BEDTIME  sucralfate (CARAFATE) 1 g tablet Take 1 tablet (1 g total) by mouth 4 (four) times daily -  with meals and at bedtime.   telmisartan (MICARDIS) 40 MG tablet Take 1 tablet by mouth once daily   Vitamin D, Ergocalciferol, (DRISDOL) 1.25 MG (50000 UNIT) CAPS capsule Take 1 capsule (50,000 Units total) by mouth every 7 (seven) days.   No facility-administered encounter medications on file as of 08/26/2021.    Allergies (verified) Patient has no known allergies.   History: Past Medical History:  Diagnosis Date   Arthritis    Heart murmur    asymptomatic   Hypercholesteremia    Hypertension    Past Surgical History:  Procedure Laterality Date   CARPAL TUNNEL RELEASE     bilateral    CATARACT EXTRACTION W/PHACO Right 09/07/2014    Procedure: CATARACT EXTRACTION PHACO AND INTRAOCULAR LENS PLACEMENT RIGHT EYE CDE=6.10;  Surgeon: Tonny Branch, MD;  Location: [facility];  Service: Ophthalmology;  Laterality: Right;   COLONOSCOPY  09/15/2011   Procedure: COLONOSCOPY;  Surgeon: Danie Binder, MD;  Location: [facility];  Service: Endoscopy;  Laterality: N/A;  11:00AM   TOTAL KNEE ARTHROPLASTY Right 04/09/2018   Procedure: TOTAL KNEE ARTHROPLASTY;  Surgeon: Carole Civil, MD;  Location: [facility];  Service: Orthopedics;  Laterality: Right;   TUBAL LIGATION     Family History  Problem Relation Age of Onset   Heart disease Father    Stroke Father    Hypertension Sister    Hypertension Sister    Heart disease Brother    Hypertension Mother    Social History   Socioeconomic History   Marital status: Widowed    Spouse name: Not on file   Number of children: 3   Years of education: Not on file   Highest education level: Not on file  Occupational History   Occupation: retired, Psychiatrist: [employer]  Tobacco Use   Smoking status: Former    Packs/day: 0.30    Years: 35.00    Total pack years: 10.50    Types: Cigarettes    Quit date: 08/22/1996    Years since quitting: 25.0   Smokeless tobacco: Never  Vaping Use   Vaping Use: Never used  Substance and Sexual Activity   Alcohol use: No   Drug use: No   Sexual activity: Yes    Birth control/protection: Post-menopausal  Other Topics Concern   Not on file  Social History Narrative   Not on file   Social Determinants of Health   Financial Resource Strain: Low Risk  (08/24/2020)   Overall Financial Resource Strain (CARDIA)    Difficulty of Paying Living Expenses: Not hard at all  Food Insecurity: No Food Insecurity (08/24/2020)   Hunger Vital Sign    Worried About Running Out of Food in the Last Year: Never true    [place] in the Last Year: Never true  Transportation Needs: No Transportation Needs (08/24/2020)   PRAPARE -  Hydrologist (Medical): No    Lack of Transportation (Non-Medical): No  Physical Activity: Inactive (08/24/2020)   Exercise Vital Sign    Days of Exercise per Week: 0 days    Minutes of Exercise per Session: 0 min  Stress: No Stress Concern Present (08/24/2020)   [place]    Feeling of Stress : Not at all  Social Connections: Moderately Isolated (08/24/2020)  Social Licensed conveyancer [NHANES]    Frequency of Communication with Friends and Family: More than three times a week    Frequency of Social Gatherings with Friends and Family: More than three times a week    Attends Religious Services: More than 4 times per year    Active Member of [employer] or Organizations: No    Attends Archivist Meetings: Never    Marital Status: Widowed    Tobacco Counseling Counseling given: Not Answered   Clinical Intake:                 Diabetic? No          Activities of Daily Living     No data to display          Patient Care Team: Robin Spar, MD as PCP - General (Internal Medicine) Danie Binder, MD (Inactive) (Gastroenterology)  Indicate any recent Medical Services you may have received from other than [facility] providers in the past year (date may be approximate).     Assessment:   This is a routine wellness examination for Robin Villa.  Hearing/Vision screen No results found.  Dietary issues and exercise activities discussed:     Goals Addressed   None    Depression Screen    06/13/2021    2:35 PM 02/03/2021    4:24 PM 11/25/2020    3:39 PM 08/24/2020    9:55 AM 08/24/2020    9:52 AM 07/22/2020    1:43 PM 06/02/2020    2:54 PM  PHQ 2/9 Scores  PHQ - 2 Score 0 0 0 0 0 0 0    Fall Risk    06/13/2021    2:35 PM 02/03/2021    4:24 PM 11/25/2020    3:39 PM 08/24/2020    9:55 AM 07/22/2020    1:42 PM  [place] in the past year? 0 0 0 0 0   Number falls in past yr: 0 0 0 0 0  Injury with Fall? 0 0 0 0 0  Risk for fall due to : No Fall Risks No Fall Risks No Fall Risks No Fall Risks No Fall Risks  Follow up Falls evaluation completed Falls evaluation completed Falls evaluation completed Falls evaluation completed Falls evaluation completed    FALL RISK PREVENTION PERTAINING TO THE HOME:  Any stairs in or around the home? Yes  If so, are there any without handrails? No  Home free of loose throw rugs in walkways, pet beds, electrical cords, etc? Yes  Adequate lighting in your home to reduce risk of falls? Yes   ASSISTIVE DEVICES UTILIZED TO PREVENT FALLS:  Life alert? No  Use of a cane, walker or w/c? No  Grab bars in the bathroom? Yes  Shower chair or bench in shower? Yes  Elevated toilet seat or a handicapped toilet? No    Cognitive Function:    08/24/2020    9:55 AM  MMSE - Mini Mental State Exam  Not completed: Unable to complete        08/24/2020    9:55 AM  6CIT Screen  What Year? 0 points  What month? 0 points  What time? 0 points  Count back from 20 0 points  Months in reverse 0 points  Repeat phrase 4 points  Total Score 4 points    Immunizations Immunization History  Administered Date(s) Administered   Fluad Quad(high Dose 65+) 10/01/2018, 12/22/2019, 11/25/2020   Hepatitis B  02/27/2008, 04/03/2008   Influenza, High Dose Seasonal PF 01/31/2017, 12/21/2017   Influenza,inj,Quad PF,6+ Mos 02/23/2015   PFIZER(Purple Top)SARS-COV-2 Vaccination 03/31/2019, 05/01/2019   Pneumococcal Conjugate-13 01/21/2014   Pneumococcal Polysaccharide-23 02/23/2015   Td 01/20/2013   Tdap 01/20/2013   Zoster Recombinat (Shingrix) 05/31/2016, 01/31/2017    TDAP status: Up to date  Flu Vaccine status: Up to date  Pneumococcal vaccine status: Up to date  Covid-19 vaccine status: Completed vaccines  Qualifies for Shingles Vaccine? Yes   Zostavax completed Yes   Shingrix Completed?: Yes  Screening  Tests Health Maintenance  Topic Date Due   COVID-19 Vaccine (3 - Pfizer series) 06/26/2019   INFLUENZA VACCINE  08/30/2021   COLONOSCOPY (Pts 45-62yr Insurance coverage will need to be confirmed)  09/14/2021   MAMMOGRAM  08/27/2022   TETANUS/TDAP  01/21/2023   Pneumonia Vaccine 71 Years old  Completed   DEXA SCAN  Completed   Hepatitis C Screening  Completed   Zoster Vaccines- Shingrix  Completed   HPV VACCINES  Aged Out    Health Maintenance  Health Maintenance Due  Topic Date Due   COVID-19 Vaccine (3 - Pfizer series) 06/26/2019    Colorectal cancer screening: Type of screening: Colonoscopy. Completed 09/15/2011. Repeat every 10 years  Mammogram status: Completed 08/26/2020. Repeat every year  Bone Density status: Completed 07/17/2016. Results reflect: Bone density results: NORMAL. Repeat every 5 years.  Lung Cancer Screening: (Low Dose CT Chest recommended if Age 73-80years, 30 pack-year currently smoking OR have quit w/in 15years.) does not qualify.    Additional Screening:  Hepatitis C Screening: does qualify; Completed 08/18/2015  Vision Screening: Recommended annual ophthalmology exams for early detection of glaucoma and other disorders of the eye. Is the patient up to date with their annual eye exam?  No  Who is the provider or what is the name of the office in which the patient attends annual eye exams? [facility]   Dental Screening: Recommended annual dental exams for proper oral hygiene  Community Resource Referral / Chronic Care Management: CRR required this visit?  No   CCM required this visit?  No      Plan:     I have personally reviewed and noted the following in the patient's chart:   Medical and social history Use of alcohol, tobacco or illicit drugs  Current medications and supplements including opioid prescriptions.  Functional ability and status Nutritional status Physical activity Advanced directives List of other  physicians Hospitalizations, surgeries, and ER visits in previous 12 months Vitals Screenings to include cognitive, depression, and falls Referrals and appointments  In addition, I have reviewed and discussed with patient certain preventive protocols, quality metrics, and best practice recommendations. A written personalized care plan for preventive services as well as general preventive health recommendations were provided to patient.     [place]y [place]illing [place]  08/26/2021   Nurse Notes:  Ms. NRew, Thank you for taking time to come for your Medicare Wellness Visit. I appreciate your ongoing commitment to your health goals. Please review the following plan we discussed and let me know if I can assist you in the future.   These are the goals we discussed:  Goals      Patient Stated     Patient would like to take  more vacations         This is a list of the screening recommended for you and due dates:  Health Maintenance  Topic Date Due  COVID-19 Vaccine (3 - Pfizer series) 06/26/2019   Flu Shot  08/30/2021   Colon Cancer Screening  09/14/2021   Mammogram  08/27/2022   Tetanus Vaccine  01/21/2023   Pneumonia Vaccine  Completed   DEXA scan (bone density measurement)  Completed   Hepatitis C Screening: USPSTF Recommendation to screen - Ages 70-79 yo.  Completed   Zoster (Shingles) Vaccine  Completed   HPV Vaccine  Aged Out

## 2021-09-21 ENCOUNTER — Telehealth: Payer: Self-pay

## 2021-09-21 ENCOUNTER — Other Ambulatory Visit: Payer: Self-pay

## 2021-09-21 DIAGNOSIS — K219 Gastro-esophageal reflux disease without esophagitis: Secondary | ICD-10-CM

## 2021-09-21 MED ORDER — PANTOPRAZOLE SODIUM 40 MG PO TBEC: 40.0000 mg | DELAYED_RELEASE_TABLET | Freq: Every day | ORAL | 1 refills | Status: DC

## 2021-09-21 NOTE — Telephone Encounter (Signed)
Patient called need med refill pantoprazole (PROTONIX) 40 MG tablet  [place], [place] - [address]  [address] [place], [place] [address]  Phone:  [phone]  Fax:  [phone]

## 2021-09-21 NOTE — Telephone Encounter (Signed)
Refills sent

## 2021-10-26 ENCOUNTER — Encounter: Payer: Self-pay | Admitting: Orthopedic Surgery

## 2021-10-26 NOTE — Progress Notes (Signed)
A user error has taken place: encounter opened in error, closed for administrative reasons.

## 2021-11-02 ENCOUNTER — Other Ambulatory Visit: Payer: Self-pay | Admitting: Internal Medicine

## 2021-11-02 DIAGNOSIS — I10 Essential (primary) hypertension: Secondary | ICD-10-CM

## 2021-12-09 ENCOUNTER — Encounter: Payer: Self-pay | Admitting: Internal Medicine

## 2021-12-09 ENCOUNTER — Ambulatory Visit (INDEPENDENT_AMBULATORY_CARE_PROVIDER_SITE_OTHER): Payer: Medicare PPO | Admitting: Internal Medicine

## 2021-12-09 VITALS — BP 136/68 | HR 80 | Ht 65.0 in | Wt 194.8 lb

## 2021-12-09 DIAGNOSIS — N1832 Chronic kidney disease, stage 3b: Secondary | ICD-10-CM

## 2021-12-09 DIAGNOSIS — Z0001 Encounter for general adult medical examination with abnormal findings: Secondary | ICD-10-CM | POA: Diagnosis not present

## 2021-12-09 DIAGNOSIS — R7303 Prediabetes: Secondary | ICD-10-CM | POA: Diagnosis not present

## 2021-12-09 DIAGNOSIS — Z1211 Encounter for screening for malignant neoplasm of colon: Secondary | ICD-10-CM

## 2021-12-09 DIAGNOSIS — E782 Mixed hyperlipidemia: Secondary | ICD-10-CM

## 2021-12-09 DIAGNOSIS — Z23 Encounter for immunization: Secondary | ICD-10-CM

## 2021-12-09 DIAGNOSIS — I10 Essential (primary) hypertension: Secondary | ICD-10-CM

## 2021-12-09 DIAGNOSIS — E559 Vitamin D deficiency, unspecified: Secondary | ICD-10-CM | POA: Diagnosis not present

## 2021-12-09 MED ORDER — SIMVASTATIN 20 MG PO TABS: 20.0000 mg | ORAL_TABLET | Freq: Every day | ORAL | 1 refills | Status: DC

## 2021-12-09 NOTE — Assessment & Plan Note (Signed)

## 2021-12-09 NOTE — Assessment & Plan Note (Signed)
Lab Results  Component Value Date   HGBA1C 6.0 (H) 01/18/2021   Advised to follow DASH diet

## 2021-12-09 NOTE — Patient Instructions (Signed)
Please continue taking medications as prescribed.  Please continue to follow low salt diet and ambulate as tolerated. 

## 2021-12-09 NOTE — Assessment & Plan Note (Signed)
Last vitamin D Lab Results  Component Value Date   VD25OH 6.5 (L) 07/13/2020   Advised to start Vitamin D 50,000 IU once every week

## 2021-12-09 NOTE — Progress Notes (Signed)
Established Patient Office Visit  Subjective:  Patient ID: Robin Villa, female    DOB: 22-Jun-1950  Age: 71 y.o. MRN: [id number]  CC:  Chief Complaint  Patient presents with   Annual Exam    CPE no concerns    HPI [name] is a 71 y.o. female with past medical history of HTN, CKD stage 3, GERD, OA of knee and insomnia who presents for annual physical.  HTN: BP is well-controlled. Takes medications regularly. Patient denies headache, dizziness, chest pain, or palpitations.  She currently takes amlodipine and hydralazine.  She has mild bilateral leg swelling, which is worse in the evening and improves in the morning with leg elevation.  CKD stage III: Her BMP shows stable GFR. She denies any dysuria or hematuria.  Denies any urinary hesitance or resistance. She admits that she needs to improve fluid intake.  Past Medical History:  Diagnosis Date   Arthritis    Heart murmur    asymptomatic   Hypercholesteremia    Hypertension     Past Surgical History:  Procedure Laterality Date   CARPAL TUNNEL RELEASE     bilateral    CATARACT EXTRACTION W/PHACO Right 09/07/2014   Procedure: CATARACT EXTRACTION PHACO AND INTRAOCULAR LENS PLACEMENT RIGHT EYE CDE=6.10;  Surgeon: Tonny Branch, MD;  Location: [facility];  Service: Ophthalmology;  Laterality: Right;   COLONOSCOPY  09/15/2011   Procedure: COLONOSCOPY;  Surgeon: Danie Binder, MD;  Location: [facility];  Service: Endoscopy;  Laterality: N/A;  11:00AM   TOTAL KNEE ARTHROPLASTY Right 04/09/2018   Procedure: TOTAL KNEE ARTHROPLASTY;  Surgeon: Carole Civil, MD;  Location: [facility];  Service: Orthopedics;  Laterality: Right;   TUBAL LIGATION      Family History  Problem Relation Age of Onset   Heart disease Father    Stroke Father    Hypertension Sister    Hypertension Sister    Heart disease Brother    Hypertension Mother     Social History   Socioeconomic History   Marital status: Widowed    Spouse name: Not on file    Number of children: 3   Years of education: Not on file   Highest education level: Not on file  Occupational History   Occupation: retired, Psychiatrist: [employer]  Tobacco Use   Smoking status: Former    Packs/day: 0.30    Years: 35.00    Total pack years: 10.50    Types: Cigarettes    Quit date: 08/22/1996    Years since quitting: 25.3   Smokeless tobacco: Never  Vaping Use   Vaping Use: Never used  Substance and Sexual Activity   Alcohol use: No   Drug use: No   Sexual activity: Yes    Birth control/protection: Post-menopausal  Other Topics Concern   Not on file  Social History Narrative   Not on file   Social Determinants of Health   Financial Resource Strain: Low Risk  (08/26/2021)   Overall Financial Resource Strain (CARDIA)    Difficulty of Paying Living Expenses: Not hard at all  Food Insecurity: No Food Insecurity (08/26/2021)   Hunger Vital Sign    Worried About Running Out of Food in the Last Year: Never true    [place] in the Last Year: Never true  Transportation Needs: No Transportation Needs (08/26/2021)   PRAPARE - Transportation    Lack of Transportation (Medical): No    Lack  of Transportation (Non-Medical): No  Physical Activity: Insufficiently Active (08/26/2021)   Exercise Vital Sign    Days of Exercise per Week: 2 days    Minutes of Exercise per Session: 30 min  Stress: No Stress Concern Present (08/26/2021)   [place]    Feeling of Stress : Not at all  Social Connections: Moderately Isolated (08/26/2021)   Social Connection and Isolation Panel [NHANES]    Frequency of Communication with Friends and Family: More than three times a week    Frequency of Social Gatherings with Friends and Family: Three times a week    Attends Religious Services: More than 4 times per year    Active Member of Clubs or Organizations: No    Attends Archivist  Meetings: Never    Marital Status: Widowed  Intimate Partner Violence: Not At Risk (08/26/2021)   Humiliation, Afraid, Rape, and Kick questionnaire    Fear of Current or Ex-Partner: No    Emotionally Abused: No    Physically Abused: No    Sexually Abused: No    Outpatient Medications Prior to Visit  Medication Sig Dispense Refill   amLODipine (NORVASC) 10 MG tablet Take 1 tablet (10 mg total) by mouth daily. 90 tablet 1   aspirin 81 MG tablet Take 1 tablet (81 mg total) by mouth daily. 30 tablet    furosemide (LASIX) 20 MG tablet Take 1 tablet (20 mg total) by mouth daily. 90 tablet 1   hydrALAZINE (APRESOLINE) 25 MG tablet Take 1 tablet (25 mg total) by mouth 2 (two) times daily. 180 tablet 1   hydrOXYzine (ATARAX/VISTARIL) 10 MG tablet Take 1 tablet (10 mg total) by mouth every 8 (eight) hours as needed. itching 30 tablet 0   Misc. Devices MISC BP cuff/divide - ICD10: I10 1 each 0   Multiple Vitamins-Minerals (MULTIVITAMIN WITH MINERALS) tablet Take 1 tablet by mouth daily.     pantoprazole (PROTONIX) 40 MG tablet Take 1 tablet (40 mg total) by mouth daily. 90 tablet 1   sucralfate (CARAFATE) 1 g tablet Take 1 tablet (1 g total) by mouth 4 (four) times daily -  with meals and at bedtime. 90 tablet 2   telmisartan (MICARDIS) 40 MG tablet Take 1 tablet by mouth once daily 90 tablet 0   Vitamin D, Ergocalciferol, (DRISDOL) 1.25 MG (50000 UNIT) CAPS capsule Take 1 capsule (50,000 Units total) by mouth every 7 (seven) days. 5 capsule 5   simvastatin (ZOCOR) 20 MG tablet TAKE 1 TABLET BY MOUTH AT BEDTIME 90 tablet 0   No facility-administered medications prior to visit.    No Known Allergies  ROS Review of Systems  Constitutional:  Negative for chills and fever.  HENT:  Negative for congestion, sinus pressure, sinus pain and sore throat.   Eyes:  Negative for pain and discharge.  Respiratory:  Negative for cough and wheezing.   Cardiovascular:  Positive for leg swelling. Negative for  chest pain and palpitations.  Gastrointestinal:  Negative for abdominal pain, constipation, diarrhea, nausea and vomiting.  Endocrine: Negative for polydipsia and polyuria.  Genitourinary:  Negative for dysuria and hematuria.  Musculoskeletal:  Negative for neck pain and neck stiffness.  Skin:  Negative for rash.  Neurological:  Negative for dizziness and weakness.  Psychiatric/Behavioral:  Negative for agitation and behavioral problems.       Objective:    Physical Exam Vitals reviewed.  Constitutional:      General: She is not  in acute distress.    Appearance: She is obese. She is not diaphoretic.  HENT:     Head: Normocephalic and atraumatic.     Nose: Nose normal.     Mouth/Throat:     Mouth: Mucous membranes are moist.  Eyes:     General: No scleral icterus.    Extraocular Movements: Extraocular movements intact.  Cardiovascular:     Rate and Rhythm: Normal rate and regular rhythm.     Pulses: Normal pulses.     Heart sounds: Normal heart sounds. No murmur heard. Pulmonary:     Breath sounds: Normal breath sounds. No wheezing or rales.  Abdominal:     Palpations: Abdomen is soft.     Tenderness: There is no abdominal tenderness.  Musculoskeletal:     Cervical back: Neck supple. No tenderness.     Right lower leg: No edema.     Left lower leg: No edema.  Skin:    General: Skin is warm.     Findings: No rash.  Neurological:     General: No focal deficit present.     Mental Status: She is alert and oriented to person, place, and time.     Cranial Nerves: No cranial nerve deficit.     Sensory: No sensory deficit.     Motor: No weakness.  Psychiatric:        Mood and Affect: Mood normal.        Behavior: Behavior normal.     BP 136/68 (BP Location: Left Arm, Cuff Size: Normal)   Pulse 80   Ht _0  (1.651 m)   Wt 194 lb 12.8 oz (88.4 kg)   SpO2 98%   BMI 32.42 kg/m  Wt Readings from Last 3 Encounters:  12/09/21 194 lb 12.8 oz (88.4 kg)  06/13/21 200 lb  3.2 oz (90.8 kg)  02/03/21 199 lb 1.9 oz (90.3 kg)    Lab Results  Component Value Date   TSH 1.240 07/13/2020   Lab Results  Component Value Date   WBC 7.9 07/13/2020   HGB 12.4 07/13/2020   HCT 37.4 07/13/2020   MCV 88 07/13/2020   PLT 236 07/13/2020   Lab Results  Component Value Date   NA 140 05/30/2021   K 4.7 05/30/2021   CO2 20 05/30/2021   GLUCOSE 102 (H) 05/30/2021   BUN 19 05/30/2021   CREATININE 1.35 (H) 05/30/2021   BILITOT 0.3 01/18/2021   ALKPHOS 87 01/18/2021   AST 22 01/18/2021   ALT 21 01/18/2021   PROT 7.0 01/18/2021   ALBUMIN 4.4 01/18/2021   CALCIUM 10.0 05/30/2021   ANIONGAP 7 04/10/2018   EGFR 41 (L) 05/30/2021   Lab Results  Component Value Date   CHOL 156 07/13/2020   Lab Results  Component Value Date   HDL 37 (L) 07/13/2020   Lab Results  Component Value Date   LDLCALC 96 07/13/2020   Lab Results  Component Value Date   TRIG 127 07/13/2020   Lab Results  Component Value Date   CHOLHDL 4.2 07/13/2020   Lab Results  Component Value Date   HGBA1C 6.0 (H) 01/18/2021      Assessment & Plan:   Problem List Items Addressed This Visit       Cardiovascular and Mediastinum   Essential hypertension    BP Readings from Last 1 Encounters:  12/09/21 136/68  controlled with Amlodipine 10 mg QD, Telmisartan 40 mg QD and Hydralazine 25 mg BID Has underlying CKD Counseled  for compliance with the medications Advised DASH diet and moderate exercise/walking as tolerated      Relevant Medications   simvastatin (ZOCOR) 20 MG tablet   Other Relevant Orders   TSH   CMP14+EGFR   CBC with Differential/Platelet     Genitourinary   CKD (chronic kidney disease) stage 3, GFR 30-59 ml/min (HCC)    Last BMP reviewed, CKD stage 3b, stable - needs to improve hydration No proteinuria On Lasix and Telmisartan Check CMP Avoid nephrotoxic agents      Relevant Orders   CMP14+EGFR     Other   Hyperlipidemia   Relevant Medications    simvastatin (ZOCOR) 20 MG tablet   Other Relevant Orders   Lipid panel   Encounter for general adult medical examination with abnormal findings - Primary    Physical exam as documented. Counseling done  re healthy lifestyle involving commitment to 150 minutes exercise per week, heart healthy diet, and attaining healthy weight.The importance of adequate sleep also discussed. Changes in health habits are decided on by the patient with goals and time frames  set for achieving them. Immunization and cancer screening needs are specifically addressed at this visit.      Relevant Orders   CMP14+EGFR   CBC with Differential/Platelet   Vitamin D deficiency    Last vitamin D Lab Results  Component Value Date   VD25OH 6.5 (L) 07/13/2020  Advised to start Vitamin D 50,000 IU once every week      Relevant Orders   VITAMIN D 25 Hydroxy (Vit-D Deficiency, Fractures)   Prediabetes    Lab Results  Component Value Date   HGBA1C 6.0 (H) 01/18/2021  Advised to follow DASH diet      Relevant Orders   Hemoglobin A1c   Other Visit Diagnoses     Colon cancer screening       Relevant Orders   Ambulatory referral to Gastroenterology   Need for immunization against influenza       Relevant Orders   Flu Vaccine QUAD High Dose(Fluad) (Completed)       Meds ordered this encounter  Medications   simvastatin (ZOCOR) 20 MG tablet    Sig: Take 1 tablet (20 mg total) by mouth at bedtime.    Dispense:  90 tablet    Refill:  1    Follow-up: Return in about 4 months (around 04/09/2022) for HTN and CKD.    Lindell Spar, MD

## 2021-12-09 NOTE — Assessment & Plan Note (Addendum)
Last BMP reviewed, CKD stage 3b, stable - needs to improve hydration No proteinuria On Lasix and Telmisartan Check CMP Avoid nephrotoxic agents

## 2021-12-09 NOTE — Assessment & Plan Note (Signed)
BP Readings from Last 1 Encounters:  12/09/21 136/68   controlled with Amlodipine 10 mg QD, Telmisartan 40 mg QD and Hydralazine 25 mg BID Has underlying CKD Counseled for compliance with the medications Advised DASH diet and moderate exercise/walking as tolerated

## 2021-12-10 LAB — LIPID PANEL
Chol/HDL Ratio: 5.3 ratio — ABNORMAL HIGH (ref 0.0–4.4)
Cholesterol, Total: 185 mg/dL (ref 100–199)
HDL: 35 mg/dL — ABNORMAL LOW (ref >39)
LDL Chol Calc (NIH): 119 mg/dL — ABNORMAL HIGH (ref 0–99)
Triglycerides: 176 mg/dL — ABNORMAL HIGH (ref 0–149)
VLDL Cholesterol Cal: 31 mg/dL (ref 5–40)

## 2021-12-10 LAB — CMP14+EGFR
ALT: 17 IU/L (ref 0–32)
AST: 18 IU/L (ref 0–40)
Albumin/Globulin Ratio: 1.7 (ref 1.2–2.2)
Albumin: 4.5 g/dL (ref 3.8–4.8)
Alkaline Phosphatase: 72 IU/L (ref 44–121)
BUN/Creatinine Ratio: 14 (ref 12–28)
BUN: 20 mg/dL (ref 8–27)
Bilirubin Total: 0.4 mg/dL (ref 0.0–1.2)
CO2: 21 mmol/L (ref 20–29)
Calcium: 10 mg/dL (ref 8.7–10.3)
Chloride: 105 mmol/L (ref 96–106)
Creatinine, Ser: 1.4 mg/dL — ABNORMAL HIGH (ref 0.57–1.00)
Globulin, Total: 2.7 g/dL (ref 1.5–4.5)
Glucose: 95 mg/dL (ref 70–99)
Potassium: 5.1 mmol/L (ref 3.5–5.2)
Sodium: 141 mmol/L (ref 134–144)
Total Protein: 7.2 g/dL (ref 6.0–8.5)
eGFR: 40 mL/min/{1.73_m2} — ABNORMAL LOW (ref >59)

## 2021-12-10 LAB — CBC WITH DIFFERENTIAL/PLATELET
Basophils Absolute: 0 10*3/uL (ref 0.0–0.2)
Basos: 1 %
EOS (ABSOLUTE): 0.1 10*3/uL (ref 0.0–0.4)
Eos: 2 %
Hematocrit: 38.2 % (ref 34.0–46.6)
Hemoglobin: 12.6 g/dL (ref 11.1–15.9)
Immature Grans (Abs): 0 10*3/uL (ref 0.0–0.1)
Immature Granulocytes: 0 %
Lymphocytes Absolute: 1.4 10*3/uL (ref 0.7–3.1)
Lymphs: 18 %
MCH: 29.2 pg (ref 26.6–33.0)
MCHC: 33 g/dL (ref 31.5–35.7)
MCV: 89 fL (ref 79–97)
Monocytes Absolute: 0.7 10*3/uL (ref 0.1–0.9)
Monocytes: 10 %
Neutrophils Absolute: 5.5 10*3/uL (ref 1.4–7.0)
Neutrophils: 69 %
Platelets: 240 10*3/uL (ref 150–450)
RBC: 4.31 x10E6/uL (ref 3.77–5.28)
RDW: 13.1 % (ref 11.7–15.4)
WBC: 7.8 10*3/uL (ref 3.4–10.8)

## 2021-12-10 LAB — TSH: TSH: 1.52 u[IU]/mL (ref 0.450–4.500)

## 2021-12-10 LAB — HEMOGLOBIN A1C
Est. average glucose Bld gHb Est-mCnc: 114 mg/dL
Hgb A1c MFr Bld: 5.6 % (ref 4.8–5.6)

## 2021-12-10 LAB — VITAMIN D 25 HYDROXY (VIT D DEFICIENCY, FRACTURES): Vit D, 25-Hydroxy: 15.4 ng/mL — ABNORMAL LOW (ref 30.0–100.0)

## 2021-12-13 ENCOUNTER — Encounter: Payer: Self-pay | Admitting: *Deleted

## 2021-12-15 ENCOUNTER — Encounter: Payer: Medicare PPO | Admitting: Internal Medicine

## 2022-01-19 ENCOUNTER — Other Ambulatory Visit: Payer: Self-pay | Admitting: Internal Medicine

## 2022-01-19 DIAGNOSIS — R600 Localized edema: Secondary | ICD-10-CM

## 2022-02-08 ENCOUNTER — Encounter: Payer: Self-pay | Admitting: *Deleted

## 2022-02-08 NOTE — Patient Instructions (Signed)
  Procedure: colonoscopy  Estimated body mass index is 32.78 kg/m as calculated from the following:   Height as of this encounter: '5\' 5"'$  (1.651 m).   Weight as of this encounter: 197 lb (89.4 kg).   Have you had a colonoscopy before?  Yes, 2013  Do you have family history of colon cancer?  No  Do you have a family history of polyps? No  Previous colonoscopy with polyps removed? yes  Do you have a history colorectal cancer?   No  Are you diabetic?  No  Do you have a prosthetic or mechanical heart valve? No  Do you have a pacemaker/defibrillator?   No  Have you had endocarditis/atrial fibrillation?  No  Do you use supplemental oxygen/CPAP?  No  Have you had joint replacement within the last 12 months?  No  Do you tend to be constipated or have to use laxatives?  No   Do you have history of alcohol use? If yes, how much and how often.  No  Do you have history or are you using drugs? If yes, what do are you  using?  No  Have you ever had a stroke/heart attack?  No  Have you ever had a heart or other vascular stent placed,?No  Do you take weight loss medication? No  female patients,: have you had a hysterectomy? No                              are you post menopausal?  yes                              do you still have your menstrual cycle? No    Date of last menstrual period? unknown  Do you take any blood-thinning medications such as: (Plavix, aspirin, Coumadin, Aggrenox, Brilinta, Xarelto, Eliquis, Pradaxa, Savaysa or Effient)?yes  If yes we need the name, milligram, dosage and who is prescribing doctor:  Aspirin 81 mg, Dr.Patel             Current Outpatient Medications  Medication Sig Dispense Refill   amLODipine (NORVASC) 10 MG tablet Take 1 tablet (10 mg total) by mouth daily. 90 tablet 1   aspirin 81 MG tablet Take 1 tablet (81 mg total) by mouth daily. 30 tablet    furosemide (LASIX) 20 MG tablet Take 1 tablet by mouth once daily 90 tablet 0   hydrALAZINE  (APRESOLINE) 25 MG tablet Take 1 tablet (25 mg total) by mouth 2 (two) times daily. 180 tablet 1   pantoprazole (PROTONIX) 40 MG tablet Take 1 tablet (40 mg total) by mouth daily. 90 tablet 1   simvastatin (ZOCOR) 20 MG tablet Take 1 tablet (20 mg total) by mouth at bedtime. 90 tablet 1   telmisartan (MICARDIS) 40 MG tablet Take 1 tablet by mouth once daily 90 tablet 0   Misc. Devices MISC BP cuff/divide - ICD10: I10 1 each 0   No current facility-administered medications for this visit.    No Known Allergies

## 2022-02-12 NOTE — Progress Notes (Signed)
ASA 2, BMP pre-op

## 2022-02-15 ENCOUNTER — Other Ambulatory Visit: Payer: Self-pay | Admitting: Internal Medicine

## 2022-02-15 DIAGNOSIS — I10 Essential (primary) hypertension: Secondary | ICD-10-CM

## 2022-02-16 ENCOUNTER — Encounter (INDEPENDENT_AMBULATORY_CARE_PROVIDER_SITE_OTHER): Payer: Self-pay | Admitting: *Deleted

## 2022-02-16 DIAGNOSIS — Z1211 Encounter for screening for malignant neoplasm of colon: Secondary | ICD-10-CM

## 2022-02-16 MED ORDER — PEG 3350-KCL-NA BICARB-NACL 420 G PO SOLR: 4000.0000 mL | Freq: Once | ORAL | 0 refills | Status: AC

## 2022-02-16 NOTE — Progress Notes (Unsigned)
PA approved via [employer]here. Auth# [id number], DOS: 03/09/2022 - 05/08/2022

## 2022-02-16 NOTE — Progress Notes (Signed)
SPOKE WITH PT. She has been scheduled with Dr. Gala Romney 2/8 at 12:30pm. Aware will send prep rx to pharmacy. Instructions mailed. Also aware to have lab work done 1 week prior at hospital.

## 2022-02-17 NOTE — Progress Notes (Signed)
Referral completed

## 2022-02-27 ENCOUNTER — Other Ambulatory Visit (HOSPITAL_COMMUNITY)
Admission: RE | Admit: 2022-02-27 | Discharge: 2022-02-27 | Disposition: A | Discharge status: Home / Self Care | Payer: Medicare PPO | Source: Ambulatory Visit | Attending: Internal Medicine | Admitting: Internal Medicine

## 2022-02-27 DIAGNOSIS — Z1211 Encounter for screening for malignant neoplasm of colon: Secondary | ICD-10-CM | POA: Diagnosis not present

## 2022-02-27 DIAGNOSIS — H25812 Combined forms of age-related cataract, left eye: Secondary | ICD-10-CM | POA: Diagnosis not present

## 2022-02-27 LAB — BASIC METABOLIC PANEL
Anion gap: 9 (ref 5–15)
BUN: 24 mg/dL — ABNORMAL HIGH (ref 8–23)
CO2: 22 mmol/L (ref 22–32)
Calcium: 9.1 mg/dL (ref 8.9–10.3)
Chloride: 106 mmol/L (ref 98–111)
Creatinine, Ser: 1.56 mg/dL — ABNORMAL HIGH (ref 0.44–1.00)
GFR, Estimated: 35 mL/min — ABNORMAL LOW (ref >60)
Glucose, Bld: 98 mg/dL (ref 70–99)
Potassium: 4.6 mmol/L (ref 3.5–5.1)
Sodium: 137 mmol/L (ref 135–145)

## 2022-03-06 ENCOUNTER — Other Ambulatory Visit: Payer: Self-pay | Admitting: Internal Medicine

## 2022-03-06 DIAGNOSIS — I10 Essential (primary) hypertension: Secondary | ICD-10-CM

## 2022-03-09 ENCOUNTER — Encounter (HOSPITAL_COMMUNITY)
Admission: RE | Disposition: A | Discharge status: Home / Self Care | Payer: Self-pay | Source: Home / Self Care | Attending: Internal Medicine

## 2022-03-09 ENCOUNTER — Ambulatory Visit (HOSPITAL_COMMUNITY): Payer: Medicare PPO | Admitting: Anesthesiology

## 2022-03-09 ENCOUNTER — Encounter (HOSPITAL_COMMUNITY): Payer: Self-pay | Admitting: Internal Medicine

## 2022-03-09 ENCOUNTER — Ambulatory Visit (HOSPITAL_BASED_OUTPATIENT_CLINIC_OR_DEPARTMENT_OTHER): Payer: Medicare PPO | Admitting: Anesthesiology

## 2022-03-09 ENCOUNTER — Ambulatory Visit (HOSPITAL_COMMUNITY)
Admission: RE | Admit: 2022-03-09 | Discharge: 2022-03-09 | Disposition: A | Discharge status: Home / Self Care | Payer: Medicare PPO | Attending: Internal Medicine | Admitting: Internal Medicine

## 2022-03-09 ENCOUNTER — Other Ambulatory Visit: Payer: Self-pay

## 2022-03-09 DIAGNOSIS — I10 Essential (primary) hypertension: Secondary | ICD-10-CM | POA: Insufficient documentation

## 2022-03-09 DIAGNOSIS — Z1211 Encounter for screening for malignant neoplasm of colon: Secondary | ICD-10-CM

## 2022-03-09 DIAGNOSIS — Z8719 Personal history of other diseases of the digestive system: Secondary | ICD-10-CM | POA: Insufficient documentation

## 2022-03-09 DIAGNOSIS — Z538 Procedure and treatment not carried out for other reasons: Secondary | ICD-10-CM

## 2022-03-09 DIAGNOSIS — Z87891 Personal history of nicotine dependence: Secondary | ICD-10-CM | POA: Diagnosis not present

## 2022-03-09 DIAGNOSIS — K219 Gastro-esophageal reflux disease without esophagitis: Secondary | ICD-10-CM | POA: Insufficient documentation

## 2022-03-09 DIAGNOSIS — N289 Disorder of kidney and ureter, unspecified: Secondary | ICD-10-CM | POA: Diagnosis not present

## 2022-03-09 HISTORY — PX: FLEXIBLE SIGMOIDOSCOPY: SHX5431

## 2022-03-09 SURGERY — SIGMOIDOSCOPY, FLEXIBLE
Anesthesia: General

## 2022-03-09 MED ORDER — LACTATED RINGERS IV SOLN: INTRAVENOUS | Status: DC

## 2022-03-09 MED ORDER — LIDOCAINE 2% (20 MG/ML) 5 ML SYRINGE
INTRAMUSCULAR | Status: DC | PRN
  Administered 2022-03-09: 50 mg via INTRAVENOUS

## 2022-03-09 MED ORDER — PROPOFOL 10 MG/ML IV BOLUS
INTRAVENOUS | Status: DC | PRN
  Administered 2022-03-09: 80 mg via INTRAVENOUS
  Administered 2022-03-09: 50 mg via INTRAVENOUS

## 2022-03-09 NOTE — H&P (Signed)
$'@LOGO'b$ @   Primary Care Physician:  Lindell Spar, MD Primary Gastroenterologist:  Dr. Gala Romney  Pre-Procedure History & Physical: HPI:  Robin Villa is a 72 y.o. female is here for a screening colonoscopy.  No bowel symptoms.  Hyperplastic polyps removed 10 years ago.  No family history of colon cancer.  Past Medical History:  Diagnosis Date   Arthritis    Heart murmur    asymptomatic   Hypercholesteremia    Hypertension     Past Surgical History:  Procedure Laterality Date   CARPAL TUNNEL RELEASE     bilateral    CATARACT EXTRACTION W/PHACO Right 09/07/2014   Procedure: CATARACT EXTRACTION PHACO AND INTRAOCULAR LENS PLACEMENT RIGHT EYE CDE=6.10;  Surgeon: Tonny Branch, MD;  Location: [facility];  Service: Ophthalmology;  Laterality: Right;   COLONOSCOPY  09/15/2011   Procedure: COLONOSCOPY;  Surgeon: Danie Binder, MD;  Location: [facility];  Service: Endoscopy;  Laterality: N/A;  11:00AM   TOTAL KNEE ARTHROPLASTY Right 04/09/2018   Procedure: TOTAL KNEE ARTHROPLASTY;  Surgeon: Carole Civil, MD;  Location: [facility];  Service: Orthopedics;  Laterality: Right;   TUBAL LIGATION      Prior to Admission medications   Medication Sig Start Date End Date Taking? Authorizing Provider  amLODipine (NORVASC) 10 MG tablet Take 1 tablet by mouth once daily 02/15/22  Yes Lindell Spar, MD  aspirin 81 MG tablet Take 1 tablet (81 mg total) by mouth daily. 03/22/15  Yes Hettinger, Modena Nunnery, MD  furosemide (LASIX) 20 MG tablet Take 1 tablet by mouth once daily 01/19/22  Yes Lindell Spar, MD  hydrALAZINE (APRESOLINE) 25 MG tablet Take 1 tablet (25 mg total) by mouth 2 (two) times daily. 06/13/21  Yes Lindell Spar, MD  pantoprazole (PROTONIX) 40 MG tablet Take 1 tablet (40 mg total) by mouth daily. 09/21/21  Yes Lindell Spar, MD  polyethylene glycol-electrolytes (NULYTELY) 420 g solution Take 4,000 mLs by mouth once. 03/06/22  Yes [provider]  simvastatin (ZOCOR) 20 MG tablet Take 1  tablet (20 mg total) by mouth at bedtime. 12/09/21  Yes Lindell Spar, MD  telmisartan (MICARDIS) 40 MG tablet Take 1 tablet by mouth once daily 03/06/22  Yes Lindell Spar, MD  Misc. Devices MISC BP cuff/divide - ICD10: I10 11/25/20   Lindell Spar, MD    Allergies as of 02/16/2022   (No Known Allergies)    Family History  Problem Relation Age of Onset   Heart disease Father    Stroke Father    Hypertension Sister    Hypertension Sister    Heart disease Brother    Hypertension Mother     Social History   Socioeconomic History   Marital status: Widowed    Spouse name: Not on file   Number of children: 3   Years of education: Not on file   Highest education level: Not on file  Occupational History   Occupation: retired, Psychiatrist: [employer]  Tobacco Use   Smoking status: Former    Packs/day: 0.30    Years: 35.00    Total pack years: 10.50    Types: Cigarettes    Quit date: 08/22/1996    Years since quitting: 25.5   Smokeless tobacco: Never  Vaping Use   Vaping Use: Never used  Substance and Sexual Activity   Alcohol use: No   Drug use: No   Sexual activity: Yes  Birth control/protection: Post-menopausal  Other Topics Concern   Not on file  Social History Narrative   Not on file   Social Determinants of Health   Financial Resource Strain: Low Risk  (08/26/2021)   Overall Financial Resource Strain (CARDIA)    Difficulty of Paying Living Expenses: Not hard at all  Food Insecurity: No Food Insecurity (08/26/2021)   Hunger Vital Sign    Worried About Running Out of Food in the Last Year: Never true    Ran Out of Food in the Last Year: Never true  Transportation Needs: No Transportation Needs (08/26/2021)   PRAPARE - Hydrologist (Medical): No    Lack of Transportation (Non-Medical): No  Physical Activity: Insufficiently Active (08/26/2021)   Exercise Vital Sign    Days of Exercise per Week: 2 days     Minutes of Exercise per Session: 30 min  Stress: No Stress Concern Present (08/26/2021)   [place]    Feeling of Stress : Not at all  Social Connections: Moderately Isolated (08/26/2021)   Social Connection and Isolation Panel [NHANES]    Frequency of Communication with Friends and Family: More than three times a week    Frequency of Social Gatherings with Friends and Family: Three times a week    Attends Religious Services: More than 4 times per year    Active Member of Clubs or Organizations: No    Attends Archivist Meetings: Never    Marital Status: Widowed  Intimate Partner Violence: Not At Risk (08/26/2021)   Humiliation, Afraid, Rape, and Kick questionnaire    Fear of Current or Ex-Partner: No    Emotionally Abused: No    Physically Abused: No    Sexually Abused: No    Review of Systems: See HPI, otherwise negative ROS  Physical Exam: BP (!) 146/66   Temp 99.2 F (37.3 C) (Oral)   Resp 14   Ht '5\' 5"'$  (1.651 m)   Wt 89.4 kg   SpO2 98%   BMI 32.78 kg/m  General:   Alert,  Well-developed, well-nourished, pleasant and cooperative in NAD Head:  Normocephalic and atraumatic. Eyes:  Sclera clear, no icterus.   Conjunctiva pink. Ears:  Normal auditory acuity. Nose:  No deformity, discharge,  or lesions. Mouth:  No deformity or lesions, dentition normal. Neck:  Supple; no masses or thyromegaly. Lungs:  Clear throughout to auscultation.   No wheezes, crackles, or rhonchi. No acute distress. Heart:  Regular rate and rhythm; no murmurs, clicks, rubs,  or gallops. Abdomen:  Soft, nontender and nondistended. No masses, hepatosplenomegaly or hernias noted. Normal bowel sounds, without guarding, and without rebound.   Msk:  Symmetrical without gross deformities. Normal posture. Pulses:  Normal pulses noted. Extremities:  Without clubbing or edema. Neurologic:  Alert and  oriented x4;  grossly normal  neurologically. Skin:  Intact without significant lesions or rashes. Cervical Nodes:  No significant cervical adenopathy. Psych:  Alert and cooperative. Normal mood and affect.  Impression/Plan: Robin Villa is now here to undergo a screening colonoscopy.  Average risk screening examination  Risks, benefits, limitations, imponderables and alternatives regarding colonoscopy have been reviewed with the patient. Questions have been answered. All parties agreeable.     Notice:  This dictation was prepared with Dragon dictation along with smaller phrase technology. Any transcriptional errors that result from this process are unintentional and may not be corrected upon review.

## 2022-03-09 NOTE — Anesthesia Preprocedure Evaluation (Signed)
Anesthesia Evaluation  Patient identified by MRN, date of birth, ID band Patient awake    Reviewed: Allergy & Precautions, H&P , NPO status , Patient's Chart, lab work & pertinent test results, reviewed documented beta blocker date and time   Airway Mallampati: II  TM Distance: >3 FB Neck ROM: full    Dental no notable dental hx.    Pulmonary neg pulmonary ROS, former smoker   Pulmonary exam normal breath sounds clear to auscultation       Cardiovascular Exercise Tolerance: Good hypertension, negative cardio ROS + Valvular Problems/Murmurs  Rhythm:regular Rate:Normal     Neuro/Psych negative neurological ROS  negative psych ROS   GI/Hepatic negative GI ROS, Neg liver ROS,GERD  ,,  Endo/Other  negative endocrine ROS    Renal/GU Renal diseasenegative Renal ROS  negative genitourinary   Musculoskeletal   Abdominal   Peds  Hematology negative hematology ROS (+)   Anesthesia Other Findings   Reproductive/Obstetrics negative OB ROS                             Anesthesia Physical Anesthesia Plan  ASA: 2  Anesthesia Plan: General   Post-op Pain Management:    Induction:   PONV Risk Score and Plan: Propofol infusion  Airway Management Planned:   Additional Equipment:   Intra-op Plan:   Post-operative Plan:   Informed Consent: I have reviewed the patients History and Physical, chart, labs and discussed the procedure including the risks, benefits and alternatives for the proposed anesthesia with the patient or authorized representative who has indicated his/her understanding and acceptance.     Dental Advisory Given  Plan Discussed with: CRNA  Anesthesia Plan Comments:        Anesthesia Quick Evaluation

## 2022-03-09 NOTE — Discharge Instructions (Signed)
    Sigmoidoscopy Discharge Instructions  Read the instructions outlined below and refer to this sheet in the next few weeks. These discharge instructions provide you with general information on caring for yourself after you leave the hospital. Your doctor may also give you specific instructions. While your treatment has been planned according to the most current medical practices available, unavoidable complications occasionally occur. If you have any problems or questions after discharge, call Dr. Gala Romney at [phone]. ACTIVITY You may resume your regular activity, but move at a slower pace for the next 24 hours.  Take frequent rest periods for the next 24 hours.  Walking will help get rid of the air and reduce the bloated feeling in your belly (abdomen).  No driving for 24 hours (because of the medicine (anesthesia) used during the test).   Do not sign any important legal documents or operate any machinery for 24 hours (because of the anesthesia used during the test).  NUTRITION Drink plenty of fluids.  You may resume your normal diet as instructed by your doctor.  Begin with a light meal and progress to your normal diet. Heavy or fried foods are harder to digest and may make you feel sick to your stomach (nauseated).  Avoid alcoholic beverages for 24 hours or as instructed.  MEDICATIONS You may resume your normal medications unless your doctor tells you otherwise.  WHAT YOU CAN EXPECT TODAY Some feelings of bloating in the abdomen.  Passage of more gas than usual.  Spotting of blood in your stool or on the toilet paper.  IF YOU HAD POLYPS REMOVED DURING THE COLONOSCOPY: No aspirin products for 7 days or as instructed.  No alcohol for 7 days or as instructed.  Eat a soft diet for the next 24 hours.  FINDING OUT THE RESULTS OF YOUR TEST Not all test results are available during your visit. If your test results are not back during the visit, make an appointment with your caregiver to find out  the results. Do not assume everything is normal if you have not heard from your caregiver or the medical facility. It is important for you to follow up on all of your test results.  SEEK IMMEDIATE MEDICAL ATTENTION IF: You have more than a spotting of blood in your stool.  Your belly is swollen (abdominal distention).  You are nauseated or vomiting.  You have a temperature over 101.  You have abdominal pain or discomfort that is severe or gets worse throughout the day.      your colonoscopy could not be completed because your colon was not prepped  adequately   office visit with Venetia Night in 6 to 8 weeks   at patient request, I called [facility] at [phone] answer

## 2022-03-09 NOTE — Transfer of Care (Signed)
Immediate Anesthesia Transfer of Care Note  Patient: Robin Villa  Procedure(s) Performed: FLEXIBLE SIGMOIDOSCOPY  Patient Location: Endoscopy Unit  Anesthesia Type:General  Level of Consciousness: awake  Airway & Oxygen Therapy: Patient Spontanous Breathing  Post-op Assessment: Report given to RN and Post -op Vital signs reviewed and stable  Post vital signs: Reviewed and stable  Last Vitals:  Vitals Value Taken Time  BP 105/51 03/09/22 1108  Temp 36.6 C 03/09/22 1108  Pulse 63 03/09/22 1108  Resp 16 03/09/22 1108  SpO2 99 % 03/09/22 1108    Last Pain:  Vitals:   03/09/22 1108  TempSrc: Oral  PainSc: 0-No pain      Patients Stated Pain Goal: 5 (21/94/71 2527)  Complications: No notable events documented.

## 2022-03-10 ENCOUNTER — Telehealth: Payer: Self-pay | Admitting: Internal Medicine

## 2022-03-10 NOTE — Anesthesia Postprocedure Evaluation (Signed)
Anesthesia Post Note  Patient: Robin Villa  Procedure(s) Performed: [name]  Patient location during evaluation: Phase II Anesthesia Type: General Level of consciousness: awake Pain management: pain level controlled Vital Signs Assessment: post-procedure vital signs reviewed and stable Respiratory status: spontaneous breathing and respiratory function stable Cardiovascular status: blood pressure returned to baseline and stable Postop Assessment: no headache and no apparent nausea or vomiting Anesthetic complications: no Comments: Late entry   No notable events documented.   Last Vitals:  Vitals:   03/09/22 1038 03/09/22 1108  BP: (!) 146/66 (!) 105/51  Pulse:  63  Resp: 14 16  Temp: 37.3 C 36.6 C  SpO2: 98% 99%    Last Pain:  Vitals:   03/09/22 1108  TempSrc: Oral  PainSc: 0-No pain                 [name]

## 2022-03-10 NOTE — Telephone Encounter (Signed)
It was recommended that patient have a follow up with Robin Night, NP in 6-8 weeks. I called patient to tell her of appointment, She said the procedure didn't work and she doesn't understand why she needs to come back in and didn't understand what was going on. I told her that Dr Gala Romney wanted her to come back to the office in 6-8 weeks and I would have his nurse call her to answer her other questions. [phone]

## 2022-03-10 NOTE — Telephone Encounter (Signed)
Spoke with pt and advised her that the provider will discuss the next steps at her office visit. Pt verbalized understanding.

## 2022-03-23 ENCOUNTER — Other Ambulatory Visit: Payer: Self-pay | Admitting: Internal Medicine

## 2022-03-23 DIAGNOSIS — I10 Essential (primary) hypertension: Secondary | ICD-10-CM

## 2022-03-29 NOTE — Op Note (Signed)
[facility] Patient Name: Robin Villa Procedure Date: 03/09/2022 10:28 AM MRN: [id number] Date of Birth: 1950-10-16 Attending MD: Norvel Richards , MD, [id number] CSN: [id number] Age: 72 Admit Type: Outpatient Procedure:                Attempted colonoscopy (sigmoidoscopy) Indications:              Screening for colorectal malignant neoplasm Providers:                Norvel Richards, MD, Lambert Mody, Ladoris Gene Technician, Technician Referring MD:              Medicines:                Propofol per Anesthesia Complications:            No immediate complications. Estimated Blood Loss:     Estimated blood loss: none. Procedure:                Pre-Anesthesia Assessment:                           - Prior to the procedure, a History and Physical                            was performed, and patient medications and                            allergies were reviewed. The patient's tolerance of                            previous anesthesia was also reviewed. The risks                            and benefits of the procedure and the sedation                            options and risks were discussed with the patient.                            All questions were answered, and informed consent                            was obtained. Prior Anticoagulants: The patient has                            taken no anticoagulant or antiplatelet agents. ASA                            Grade Assessment: III - A patient with severe                            systemic disease. After reviewing the risks and  benefits, the patient was deemed in satisfactory                            condition to undergo the procedure.                           After obtaining informed consent, the colonoscope                            was passed under direct vision. Throughout the                            procedure, the patient's blood pressure, pulse,  and                            oxygen saturations were monitored continuously. The                            [phone] scope was introduced through the                            anus with the intention of advancing to the cecum.                            The scope was advanced to the sigmoid colon before                            the procedure was aborted. Medications were given.                            The colonoscopy was performed without difficulty.                            The patient tolerated the procedure well. The                            quality of the bowel preparation was inadequate.                            The rectum was photographed. Scope In: 11:03:11 AM Scope Out: 11:05:08 AM Total Procedure Duration: 0 hours 1 minute 57 seconds  Findings:      The perianal and digital rectal examinations were normal. Examination       the rectum and distal sigmoid revealed formed and semiformed stool       elements which precluded attempting to complete colonoscopy in the       setting of an inadequate preparation. Impression:               - Preparation of the colon was inadequate.                            Colonoscopy not performed.                           - No specimens  collected. Moderate Sedation:      Moderate (conscious) sedation was personally administered by an       anesthesia professional. The following parameters were monitored: oxygen       saturation, heart rate, blood pressure, respiratory rate, EKG, adequacy       of pulmonary ventilation, and response to care. Recommendation:           Patient is to return to the office for a follow-up                            appointment to set up a repeat colonoscopy with an                            adequate preparation. Procedure Code(s):        --- Professional ---                           [phone], 93, Colonoscopy, flexible; diagnostic,                            including collection of specimen(s) by  brushing or                            washing, when performed (separate procedure) Diagnosis Code(s):        --- Professional ---                           Z12.11, Encounter for screening for malignant                            neoplasm of colon CPT copyright 2022 American [employer] Association. All rights reserved. The codes documented in this report are preliminary and upon coder review may  be revised to meet current compliance requirements. [name]. Lindy Garczynski, MD Norvel Richards, MD 03/29/2022 4:03:26 PM This report has been signed electronically. Number of Addenda: 0

## 2022-03-30 ENCOUNTER — Encounter: Payer: Self-pay | Admitting: Radiology

## 2022-04-04 ENCOUNTER — Encounter (HOSPITAL_COMMUNITY): Payer: Self-pay | Admitting: Internal Medicine

## 2022-04-10 ENCOUNTER — Ambulatory Visit: Payer: Medicare PPO | Admitting: Internal Medicine

## 2022-04-10 ENCOUNTER — Encounter: Payer: Self-pay | Admitting: Internal Medicine

## 2022-04-10 VITALS — BP 127/73 | HR 80 | Ht 65.0 in | Wt 202.8 lb

## 2022-04-10 DIAGNOSIS — I10 Essential (primary) hypertension: Secondary | ICD-10-CM

## 2022-04-10 DIAGNOSIS — I351 Nonrheumatic aortic (valve) insufficiency: Secondary | ICD-10-CM | POA: Insufficient documentation

## 2022-04-10 DIAGNOSIS — I5032 Chronic diastolic (congestive) heart failure: Secondary | ICD-10-CM | POA: Diagnosis not present

## 2022-04-10 DIAGNOSIS — N1832 Chronic kidney disease, stage 3b: Secondary | ICD-10-CM

## 2022-04-10 DIAGNOSIS — E782 Mixed hyperlipidemia: Secondary | ICD-10-CM | POA: Diagnosis not present

## 2022-04-10 DIAGNOSIS — R600 Localized edema: Secondary | ICD-10-CM

## 2022-04-10 MED ORDER — TELMISARTAN 40 MG PO TABS: 40.0000 mg | ORAL_TABLET | Freq: Every day | ORAL | 3 refills | Status: DC

## 2022-04-10 MED ORDER — AMLODIPINE BESYLATE 10 MG PO TABS: 10.0000 mg | ORAL_TABLET | Freq: Every day | ORAL | 3 refills | Status: DC

## 2022-04-10 MED ORDER — SIMVASTATIN 20 MG PO TABS: 20.0000 mg | ORAL_TABLET | Freq: Every day | ORAL | 1 refills | Status: DC

## 2022-04-10 MED ORDER — HYDRALAZINE HCL 25 MG PO TABS: 25.0000 mg | ORAL_TABLET | Freq: Two times a day (BID) | ORAL | 3 refills | Status: DC

## 2022-04-10 NOTE — Assessment & Plan Note (Signed)
BP Readings from Last 1 Encounters:  04/10/22 127/73   controlled with Amlodipine 10 mg QD, Telmisartan 40 mg QD and Hydralazine 25 mg BID Has underlying CKD Counseled for compliance with the medications Advised DASH diet and moderate exercise/walking as tolerated

## 2022-04-10 NOTE — Assessment & Plan Note (Signed)
Echo showed mild to moderate AR Pulse pressure wnl No PND or orthopnea If any new symptom, will refer to Cardiology

## 2022-04-10 NOTE — Assessment & Plan Note (Signed)
Last BMP reviewed, CKD stage 3b, stable - needs to improve hydration On Lasix and Telmisartan Check BMP and urine protein creatinine ratio Avoid nephrotoxic agents

## 2022-04-10 NOTE — Assessment & Plan Note (Signed)
Echo showed Grade I diastolic dysfunction Has chronic leg swelling, but no orthopnea or PND Lasix PRN for now

## 2022-04-10 NOTE — Assessment & Plan Note (Addendum)
On statin Checked lipid profile 

## 2022-04-10 NOTE — Patient Instructions (Signed)
Please continue to take medications as prescribed.  Please continue to follow low salt diet and perform moderate exercise/walking at least 150 mins/week. 

## 2022-04-10 NOTE — Assessment & Plan Note (Signed)
Chronic leg swelling Could be due to venous insufficiency, advised leg elevation as tolerated Echo showed HFpEF, has Lasix PRN for leg swelling

## 2022-04-10 NOTE — Progress Notes (Signed)
Established Patient Office Visit  Subjective:  Patient ID: Robin Villa, female    DOB: 11-Dec-1950  Age: 72 y.o. MRN: [id number]  CC:  Chief Complaint  Patient presents with   Hypertension    Four month follow up for hypertension and CKD. Patient states she Is having swelling in her feet mainly during the day when she is standing for long periods.    HPI Robin Villa is a 72 y.o. female with past medical history of HTN, CKD stage 3, GERD, OA of knee and insomnia who presents for f/u of her chronic medical conditions.  HTN: BP is well-controlled. Takes medications regularly. Patient denies headache, dizziness, chest pain, or palpitations.  She currently takes amlodipine and hydralazine. She has mild bilateral leg swelling, which is worse in the evening and improves in the morning with leg elevation.  She had Echo done, which showed grade 1 diastolic dysfunction and mild to moderate AR.  She currently denies any orthopnea or PND.  CKD stage III: Her BMP shows stable GFR. She denies any dysuria or hematuria.  Denies any urinary hesitance or resistance. She admits that she needs to improve fluid intake.  Past Medical History:  Diagnosis Date   Arthritis    Heart murmur    asymptomatic   Hypercholesteremia    Hypertension     Past Surgical History:  Procedure Laterality Date   CARPAL TUNNEL RELEASE     bilateral    CATARACT EXTRACTION W/PHACO Right 09/07/2014   Procedure: CATARACT EXTRACTION PHACO AND INTRAOCULAR LENS PLACEMENT RIGHT EYE CDE=6.10;  Surgeon: Tonny Branch, MD;  Location: [facility];  Service: Ophthalmology;  Laterality: Right;   COLONOSCOPY  09/15/2011   Procedure: COLONOSCOPY;  Surgeon: Danie Binder, MD;  Location: [facility];  Service: Endoscopy;  Laterality: N/A;  11:00AM   FLEXIBLE SIGMOIDOSCOPY N/A 03/09/2022   Procedure: FLEXIBLE SIGMOIDOSCOPY;  Surgeon: Daneil Dolin, MD;  Location: [facility];  Service: Endoscopy;  Laterality: N/A;   TOTAL KNEE ARTHROPLASTY Right  04/09/2018   Procedure: TOTAL KNEE ARTHROPLASTY;  Surgeon: Carole Civil, MD;  Location: [facility];  Service: Orthopedics;  Laterality: Right;   TUBAL LIGATION      Family History  Problem Relation Age of Onset   Heart disease Father    Stroke Father    Hypertension Sister    Hypertension Sister    Heart disease Brother    Hypertension Mother     Social History   Socioeconomic History   Marital status: Widowed    Spouse name: Not on file   Number of children: 3   Years of education: Not on file   Highest education level: Not on file  Occupational History   Occupation: retired, Psychiatrist: [employer]  Tobacco Use   Smoking status: Former    Packs/day: 0.30    Years: 35.00    Total pack years: 10.50    Types: Cigarettes    Quit date: 08/22/1996    Years since quitting: 25.6   Smokeless tobacco: Never  Vaping Use   Vaping Use: Never used  Substance and Sexual Activity   Alcohol use: No   Drug use: No   Sexual activity: Yes    Birth control/protection: Post-menopausal  Other Topics Concern   Not on file  Social History Narrative   Not on file   Social Determinants of Health   Financial Resource Strain: Low Risk  (08/26/2021)   Overall Financial  Resource Strain (CARDIA)    Difficulty of Paying Living Expenses: Not hard at all  Food Insecurity: No Food Insecurity (08/26/2021)   Hunger Vital Sign    Worried About Running Out of Food in the Last Year: Never true    Ran Out of Food in the Last Year: Never true  Transportation Needs: No Transportation Needs (08/26/2021)   PRAPARE - Hydrologist (Medical): No    Lack of Transportation (Non-Medical): No  Physical Activity: Insufficiently Active (08/26/2021)   Exercise Vital Sign    Days of Exercise per Week: 2 days    Minutes of Exercise per Session: 30 min  Stress: No Stress Concern Present (08/26/2021)   [place]    Feeling of Stress : Not at all  Social Connections: Moderately Isolated (08/26/2021)   Social Connection and Isolation Panel [NHANES]    Frequency of Communication with Friends and Family: More than three times a week    Frequency of Social Gatherings with Friends and Family: Three times a week    Attends Religious Services: More than 4 times per year    Active Member of Clubs or Organizations: No    Attends Archivist Meetings: Never    Marital Status: Widowed  Intimate Partner Violence: Not At Risk (08/26/2021)   Humiliation, Afraid, Rape, and Kick questionnaire    Fear of Current or Ex-Partner: No    Emotionally Abused: No    Physically Abused: No    Sexually Abused: No    Outpatient Medications Prior to Visit  Medication Sig Dispense Refill   aspirin 81 MG tablet Take 1 tablet (81 mg total) by mouth daily. 30 tablet    furosemide (LASIX) 20 MG tablet Take 1 tablet by mouth once daily 90 tablet 0   Misc. Devices MISC BP cuff/divide - ICD10: I10 1 each 0   pantoprazole (PROTONIX) 40 MG tablet Take 1 tablet (40 mg total) by mouth daily. 90 tablet 1   polyethylene glycol-electrolytes (NULYTELY) 420 g solution Take 4,000 mLs by mouth once.     amLODipine (NORVASC) 10 MG tablet Take 1 tablet by mouth once daily 90 tablet 0   hydrALAZINE (APRESOLINE) 25 MG tablet Take 1 tablet by mouth twice daily 180 tablet 0   simvastatin (ZOCOR) 20 MG tablet Take 1 tablet (20 mg total) by mouth at bedtime. 90 tablet 1   telmisartan (MICARDIS) 40 MG tablet Take 1 tablet by mouth once daily 90 tablet 0   No facility-administered medications prior to visit.    No Known Allergies  ROS Review of Systems  Constitutional:  Negative for chills and fever.  HENT:  Negative for congestion, sinus pressure, sinus pain and sore throat.   Eyes:  Negative for pain and discharge.  Respiratory:  Negative for cough and wheezing.   Cardiovascular:  Positive for leg swelling. Negative for  chest pain and palpitations.  Gastrointestinal:  Negative for abdominal pain, constipation, diarrhea, nausea and vomiting.  Endocrine: Negative for polydipsia and polyuria.  Genitourinary:  Negative for dysuria and hematuria.  Musculoskeletal:  Negative for neck pain and neck stiffness.  Skin:  Negative for rash.  Neurological:  Negative for dizziness and weakness.  Psychiatric/Behavioral:  Negative for agitation and behavioral problems.       Objective:    Physical Exam Vitals reviewed.  Constitutional:      General: She is not in acute distress.    Appearance: She  is obese. She is not diaphoretic.  HENT:     Head: Normocephalic and atraumatic.     Nose: Nose normal.     Mouth/Throat:     Mouth: Mucous membranes are moist.  Eyes:     General: No scleral icterus.    Extraocular Movements: Extraocular movements intact.  Cardiovascular:     Rate and Rhythm: Normal rate and regular rhythm.     Pulses: Normal pulses.     Heart sounds: Normal heart sounds. No murmur heard. Pulmonary:     Breath sounds: Normal breath sounds. No wheezing or rales.  Musculoskeletal:     Cervical back: Neck supple. No tenderness.     Right lower leg: No edema.     Left lower leg: No edema.  Skin:    General: Skin is warm.     Findings: No rash.  Neurological:     General: No focal deficit present.     Mental Status: She is alert and oriented to person, place, and time.     Cranial Nerves: No cranial nerve deficit.     Sensory: No sensory deficit.     Motor: No weakness.  Psychiatric:        Mood and Affect: Mood normal.        Behavior: Behavior normal.     BP 127/73 (BP Location: Left Arm, Patient Position: Sitting, Cuff Size: Large)   Pulse 80   Ht '5\' 5"'$  (1.651 m)   Wt 202 lb 12.8 oz (92 kg)   SpO2 95%   BMI 33.75 kg/m  Wt Readings from Last 3 Encounters:  04/10/22 202 lb 12.8 oz (92 kg)  03/09/22 197 lb (89.4 kg)  02/08/22 197 lb (89.4 kg)    Lab Results  Component Value  Date   TSH 1.520 12/09/2021   Lab Results  Component Value Date   WBC 7.8 12/09/2021   HGB 12.6 12/09/2021   HCT 38.2 12/09/2021   MCV 89 12/09/2021   PLT 240 12/09/2021   Lab Results  Component Value Date   NA 137 02/27/2022   K 4.6 02/27/2022   CO2 22 02/27/2022   GLUCOSE 98 02/27/2022   BUN 24 (H) 02/27/2022   CREATININE 1.56 (H) 02/27/2022   BILITOT 0.4 12/09/2021   ALKPHOS 72 12/09/2021   AST 18 12/09/2021   ALT 17 12/09/2021   PROT 7.2 12/09/2021   ALBUMIN 4.5 12/09/2021   CALCIUM 9.1 02/27/2022   ANIONGAP 9 02/27/2022   EGFR 40 (L) 12/09/2021   Lab Results  Component Value Date   CHOL 185 12/09/2021   Lab Results  Component Value Date   HDL 35 (L) 12/09/2021   Lab Results  Component Value Date   LDLCALC 119 (H) 12/09/2021   Lab Results  Component Value Date   TRIG 176 (H) 12/09/2021   Lab Results  Component Value Date   CHOLHDL 5.3 (H) 12/09/2021   Lab Results  Component Value Date   HGBA1C 5.6 12/09/2021      Assessment & Plan:   Problem List Items Addressed This Visit       Cardiovascular and Mediastinum   Essential hypertension - Primary    BP Readings from Last 1 Encounters:  04/10/22 127/73  controlled with Amlodipine 10 mg QD, Telmisartan 40 mg QD and Hydralazine 25 mg BID Has underlying CKD Counseled for compliance with the medications Advised DASH diet and moderate exercise/walking as tolerated      Relevant Medications   amLODipine (NORVASC) 10  MG tablet   hydrALAZINE (APRESOLINE) 25 MG tablet   telmisartan (MICARDIS) 40 MG tablet   simvastatin (ZOCOR) 20 MG tablet   Nonrheumatic aortic valve insufficiency    Echo showed mild to moderate AR Pulse pressure wnl No PND or orthopnea If any new symptom, will refer to Cardiology      Relevant Medications   amLODipine (NORVASC) 10 MG tablet   hydrALAZINE (APRESOLINE) 25 MG tablet   telmisartan (MICARDIS) 40 MG tablet   simvastatin (ZOCOR) 20 MG tablet   Chronic heart  failure with preserved ejection fraction (HCC)    Echo showed Grade I diastolic dysfunction Has chronic leg swelling, but no orthopnea or PND Lasix PRN for now      Relevant Medications   amLODipine (NORVASC) 10 MG tablet   hydrALAZINE (APRESOLINE) 25 MG tablet   telmisartan (MICARDIS) 40 MG tablet   simvastatin (ZOCOR) 20 MG tablet     Genitourinary   CKD (chronic kidney disease) stage 3, GFR 30-59 ml/min (HCC)    Last BMP reviewed, CKD stage 3b, stable - needs to improve hydration On Lasix and Telmisartan Check BMP and urine protein creatinine ratio Avoid nephrotoxic agents      Relevant Orders   Basic Metabolic Panel (BMET)   Protein / creatinine ratio, urine     Other   Hyperlipidemia    On statin Checked lipid profile      Relevant Medications   amLODipine (NORVASC) 10 MG tablet   hydrALAZINE (APRESOLINE) 25 MG tablet   telmisartan (MICARDIS) 40 MG tablet   simvastatin (ZOCOR) 20 MG tablet   Leg edema    Chronic leg swelling Could be due to venous insufficiency, advised leg elevation as tolerated Echo showed HFpEF, has Lasix PRN for leg swelling      Meds ordered this encounter  Medications   amLODipine (NORVASC) 10 MG tablet    Sig: Take 1 tablet (10 mg total) by mouth daily.    Dispense:  90 tablet    Refill:  3   hydrALAZINE (APRESOLINE) 25 MG tablet    Sig: Take 1 tablet (25 mg total) by mouth 2 (two) times daily.    Dispense:  180 tablet    Refill:  3   telmisartan (MICARDIS) 40 MG tablet    Sig: Take 1 tablet (40 mg total) by mouth daily.    Dispense:  90 tablet    Refill:  3   simvastatin (ZOCOR) 20 MG tablet    Sig: Take 1 tablet (20 mg total) by mouth at bedtime.    Dispense:  90 tablet    Refill:  1    Follow-up: Return in about 4 months (around 08/10/2022) for CKD and HTN.    Lindell Spar, MD

## 2022-04-11 ENCOUNTER — Other Ambulatory Visit: Payer: Self-pay | Admitting: Internal Medicine

## 2022-04-11 ENCOUNTER — Telehealth: Payer: Self-pay | Admitting: Internal Medicine

## 2022-04-11 DIAGNOSIS — N1832 Chronic kidney disease, stage 3b: Secondary | ICD-10-CM

## 2022-04-11 NOTE — Telephone Encounter (Signed)
Patient asked for nurse to return call about the kidney doctor please call her cell [phone]

## 2022-04-11 NOTE — Telephone Encounter (Signed)
Spoke to patient

## 2022-04-12 LAB — BASIC METABOLIC PANEL
BUN/Creatinine Ratio: 14 (ref 12–28)
BUN: 24 mg/dL (ref 8–27)
CO2: 22 mmol/L (ref 20–29)
Calcium: 9.9 mg/dL (ref 8.7–10.3)
Chloride: 105 mmol/L (ref 96–106)
Creatinine, Ser: 1.74 mg/dL — ABNORMAL HIGH (ref 0.57–1.00)
Glucose: 94 mg/dL (ref 70–99)
Potassium: 4.4 mmol/L (ref 3.5–5.2)
Sodium: 142 mmol/L (ref 134–144)
eGFR: 30 mL/min/{1.73_m2} — ABNORMAL LOW (ref >59)

## 2022-04-12 LAB — PROTEIN / CREATININE RATIO, URINE
Creatinine, Urine: 77.6 mg/dL
Protein, Ur: 11.5 mg/dL
Protein/Creat Ratio: 148 mg/g creat (ref 0–200)

## 2022-04-18 ENCOUNTER — Other Ambulatory Visit: Payer: Self-pay | Admitting: Internal Medicine

## 2022-04-18 DIAGNOSIS — R600 Localized edema: Secondary | ICD-10-CM

## 2022-04-19 NOTE — Progress Notes (Unsigned)
GI Office Note    Referring Provider: Lindell Spar, MD Primary Care Physician:  Lindell Spar, MD  Primary Gastroenterologist: [name].Rourk, MD , Previously Dr. Oneida Alar  Chief Complaint   Chief Complaint  Patient presents with   Colonoscopy    Here to schedule repeat colonoscopy due to poor prep   History of Present Illness   Robin Villa is a 72 y.o. female presenting today at the request of Lindell Spar, MD for discussion on rescheduling colonoscopy.  Colonoscopy in August 2013: -Two 3 mm sessile polyps in the ascending colon -2 sessile polyps removed from the descending colon -2 sessile polyps removed from the sigmoid colon -Frequent diverticula throughout the colon -Moderate internal hemorrhoids -Polyps revealed to be hyperplastic and benign mucosal tissue -Recommend repeat in 10 years and follow high-fiber diet.  Patient was triaged for colonoscopy and scheduled for procedure 03/29/22.  Preparation of the colon was inadequate revealing formed and semiformed stool within the rectum and distal sigmoid colon.  Today: Constipation - reports she does not usually have constipation. Has a BM about every 3 days or so. Sometimes she does have to strain. No abdominal pain. No melena or brbpr.   She did drink all of her prep and  did have quite a few bowel movements. She does report still seeing more solid stools previously even after drinking everything.   Reports she ate a dark colored possible and put some pepper in her soup.  Denies any lack of appetite, unintentional weight loss, abdominal pain, chest pain, shortness of breath.  She does have some baseline lower extremity edema for which she takes Lasix daily.  Current Outpatient Medications  Medication Sig Dispense Refill   amLODipine (NORVASC) 10 MG tablet Take 1 tablet (10 mg total) by mouth daily. 90 tablet 3   aspirin 81 MG tablet Take 1 tablet (81 mg total) by mouth daily. 30 tablet    furosemide (LASIX) 20  MG tablet Take 1 tablet by mouth once daily 90 tablet 0   hydrALAZINE (APRESOLINE) 25 MG tablet Take 1 tablet (25 mg total) by mouth 2 (two) times daily. 180 tablet 3   Misc. Devices MISC BP cuff/divide - ICD10: I10 1 each 0   pantoprazole (PROTONIX) 40 MG tablet Take 1 tablet (40 mg total) by mouth daily. 90 tablet 1   simvastatin (ZOCOR) 20 MG tablet Take 1 tablet (20 mg total) by mouth at bedtime. 90 tablet 1   telmisartan (MICARDIS) 40 MG tablet Take 1 tablet (40 mg total) by mouth daily. 90 tablet 3   No current facility-administered medications for this visit.    Past Medical History:  Diagnosis Date   Arthritis    Heart murmur    asymptomatic   Hypercholesteremia    Hypertension     Past Surgical History:  Procedure Laterality Date   CARPAL TUNNEL RELEASE     bilateral    CATARACT EXTRACTION W/PHACO Right 09/07/2014   Procedure: CATARACT EXTRACTION PHACO AND INTRAOCULAR LENS PLACEMENT RIGHT EYE CDE=6.10;  Surgeon: Tonny Branch, MD;  Location: [facility];  Service: Ophthalmology;  Laterality: Right;   COLONOSCOPY  09/15/2011   Procedure: COLONOSCOPY;  Surgeon: Danie Binder, MD;  Location: [facility];  Service: Endoscopy;  Laterality: N/A;  11:00AM   FLEXIBLE SIGMOIDOSCOPY N/A 03/09/2022   Procedure: FLEXIBLE SIGMOIDOSCOPY;  Surgeon: Daneil Dolin, MD;  Location: [facility];  Service: Endoscopy;  Laterality: N/A;   TOTAL KNEE ARTHROPLASTY Right 04/09/2018  Procedure: TOTAL KNEE ARTHROPLASTY;  Surgeon: Carole Civil, MD;  Location: [facility];  Service: Orthopedics;  Laterality: Right;   TUBAL LIGATION      Family History  Problem Relation Age of Onset   Heart disease Father    Stroke Father    Hypertension Sister    Hypertension Sister    Heart disease Brother    Hypertension Mother     Allergies as of 04/20/2022   (No Known Allergies)    Social History   Socioeconomic History   Marital status: Widowed    Spouse name: Not on file   Number of children: 3    Years of education: Not on file   Highest education level: Not on file  Occupational History   Occupation: retired, Psychiatrist: [employer]  Tobacco Use   Smoking status: Former    Packs/day: 0.30    Years: 35.00    Additional pack years: 0.00    Total pack years: 10.50    Types: Cigarettes    Quit date: 08/22/1996    Years since quitting: 25.6   Smokeless tobacco: Never  Vaping Use   Vaping Use: Never used  Substance and Sexual Activity   Alcohol use: No   Drug use: No   Sexual activity: Yes    Birth control/protection: Post-menopausal  Other Topics Concern   Not on file  Social History Narrative   Not on file   Social Determinants of Health   Financial Resource Strain: Low Risk  (08/26/2021)   Overall Financial Resource Strain (CARDIA)    Difficulty of Paying Living Expenses: Not hard at all  Food Insecurity: No Food Insecurity (08/26/2021)   Hunger Vital Sign    Worried About Running Out of Food in the Last Year: Never true    [place] in the Last Year: Never true  Transportation Needs: No Transportation Needs (08/26/2021)   PRAPARE - Hydrologist (Medical): No    Lack of Transportation (Non-Medical): No  Physical Activity: Insufficiently Active (08/26/2021)   Exercise Vital Sign    Days of Exercise per Week: 2 days    Minutes of Exercise per Session: 30 min  Stress: No Stress Concern Present (08/26/2021)   [place]    Feeling of Stress : Not at all  Social Connections: Moderately Isolated (08/26/2021)   Social Connection and Isolation Panel [NHANES]    Frequency of Communication with Friends and Family: More than three times a week    Frequency of Social Gatherings with Friends and Family: Three times a week    Attends Religious Services: More than 4 times per year    Active Member of Clubs or Organizations: No    Attends Archivist  Meetings: Never    Marital Status: Widowed  Intimate Partner Violence: Not At Risk (08/26/2021)   Humiliation, Afraid, Rape, and Kick questionnaire    Fear of Current or Ex-Partner: No    Emotionally Abused: No    Physically Abused: No    Sexually Abused: No     Review of Systems   Gen: Denies any fever, chills, fatigue, weight loss, lack of appetite.  CV: Denies chest pain, heart palpitations, peripheral edema, syncope.  Resp: Denies shortness of breath at rest or with exertion. Denies wheezing or cough.  GI: see HPI GU : Denies urinary burning, urinary frequency, urinary hesitancy MS: Denies joint pain, muscle  weakness, cramps, or limitation of movement.  Derm: Denies rash, itching, dry skin Psych: Denies depression, anxiety, memory loss, and confusion Heme: Denies bruising, bleeding, and enlarged lymph nodes.   Physical Exam   BP (!) 146/68 (BP Location: Right Arm, Patient Position: Sitting, Cuff Size: Large)   Pulse 91   Temp 97.8 F (36.6 C) (Oral)   Ht 5\' 5"  (1.651 m)   Wt 202 lb 6.4 oz (91.8 kg)   SpO2 98%   BMI 33.68 kg/m   General:   Alert and oriented. Pleasant and cooperative. Well-nourished and well-developed.  Head:  Normocephalic and atraumatic. Eyes:  Without icterus, sclera clear and conjunctiva pink.  Ears:  Normal auditory acuity. Mouth:  No deformity or lesions, oral mucosa pink.  Lungs:  Clear to auscultation bilaterally. No wheezes, rales, or rhonchi. No distress.  Heart:  S1, S2 present without murmurs appreciated.  Abdomen:  +BS, soft, non-tender and non-distended. No HSM noted. No guarding or rebound. No masses appreciated.  Rectal:  Deferred  Msk:  Symmetrical without gross deformities. Normal posture. Extremities:  Without edema. Neurologic:  Alert and  oriented x4;  grossly normal neurologically. Skin:  Intact without significant lesions or rashes. Psych:  Alert and cooperative. Normal mood and affect.   Assessment   Robin Villa is a 72  y.o. female with a history of arthritis, hypercholesteremia, HTN presenting today for discussion on rescheduling colonoscopy due to inadequate prep.   Screening for colon cancer: Last colonoscopy in 2013 with hyperplastic polyps, pancolonic diverticulosis, and internal hemorrhoids.  Attempted colonoscopy 03/21/2022 however she had inadequate prep.  She reports she followed instructions completely including drinking all of her prep and taking her Dulcolax as well as following clear liquid diet.  Denied any vomiting with the prep.  Suspect constipation played a role in this therefore we will start on a bowel regimen and augment her prep with an extra half day of clears, additional Dulcolax, and taking Linzess under 45 mcg daily for 4 days prior.  Constipation: Patient has bowel movement every 3 days and does occasionally have to strain.  Denies any abdominal pain, melena, or BRBPR.  Initially declined any diarrhea however based off of her pattern and her poor colonoscopy prep she likely has baseline constipation.  Advise begin fiber supplement as well as MiraLAX 1 capful nightly.  We will have to augment her colonoscopy prep.  PLAN   Proceed with colonoscopy with propofol by Dr. Gala Romney in near future: the risks, benefits, and alternatives have been discussed with the patient in detail. The patient states understanding and desires to proceed. ASA 2 BMP pre op Extra half day clear liquids Repeat TriLyte split prep, take 2 Dulcolax prior to starting prep and repeat halfway through Linzess 145 mcg daily for 4 days prior to procedure.  Start taking MiraLAX 17 g once nightly Start taking fiber supplement such as Benefiber 2 to 3 teaspoons daily.  If you do not want to do the powder you may get over-the-counter fiber capsules. Follow up in 4 months.    Venetia Night, MSN, FNP-BC, AGACNP-BC [facility]

## 2022-04-20 ENCOUNTER — Ambulatory Visit: Payer: Medicare PPO | Admitting: Gastroenterology

## 2022-04-20 ENCOUNTER — Encounter: Payer: Self-pay | Admitting: Gastroenterology

## 2022-04-20 VITALS — BP 122/73 | HR 91 | Temp 97.8°F | Ht 65.0 in | Wt 202.4 lb

## 2022-04-20 DIAGNOSIS — K5900 Constipation, unspecified: Secondary | ICD-10-CM | POA: Diagnosis not present

## 2022-04-20 DIAGNOSIS — Z1211 Encounter for screening for malignant neoplasm of colon: Secondary | ICD-10-CM | POA: Diagnosis not present

## 2022-04-20 NOTE — Patient Instructions (Addendum)
We will get you scheduled for repeat colonoscopy in the near future with Dr. Gala Romney.  You will receive separate detailed written instructions regarding your prep which we discussed in the office today.  Please save these Linzess samples that we have provided you today for your colonoscopy prep.  Your constipation: Start taking MiraLAX 17 g once nightly mixed in 8 ounces of liquid of your choice. Start a fiber supplement such as Benefiber 2-3 teaspoons daily.  You may take over-the-counter fiber capsules if you do not want to do the powder.  We will plan to follow-up in 4 months, sooner if needed.  It was a pleasure to see you today. I want to create trusting relationships with patients. If you receive a survey regarding your visit,  I greatly appreciate you taking time to fill this out on paper or through your MyChart. I value your feedback.  Venetia Night, MSN, FNP-BC, AGACNP-BC [facility]

## 2022-04-21 ENCOUNTER — Telehealth (INDEPENDENT_AMBULATORY_CARE_PROVIDER_SITE_OTHER): Payer: Self-pay | Admitting: *Deleted

## 2022-04-21 DIAGNOSIS — Z1211 Encounter for screening for malignant neoplasm of colon: Secondary | ICD-10-CM

## 2022-04-21 MED ORDER — PEG 3350-KCL-NA BICARB-NACL 420 G PO SOLR: 4000.0000 mL | Freq: Once | ORAL | 0 refills | Status: AC

## 2022-04-21 NOTE — Telephone Encounter (Signed)
CALLED PT. She has been scheduled for TCS with Dr. Gala Romney asa 2 on 4/25. Aware needs labs prior and will have done at [place]. Will send instructions to her. Rx for prep to pharmacy. She already was given linzess samples at [facility].    PA submitted via cohere. Approved. Authorization [id number] , DOS: 05/25/2022 - 08/24/2022

## 2022-05-17 ENCOUNTER — Other Ambulatory Visit (HOSPITAL_COMMUNITY)
Admission: RE | Admit: 2022-05-17 | Discharge: 2022-05-17 | Disposition: A | Discharge status: Home / Self Care | Payer: Medicare PPO | Source: Ambulatory Visit | Attending: Internal Medicine | Admitting: Internal Medicine

## 2022-05-17 DIAGNOSIS — Z1211 Encounter for screening for malignant neoplasm of colon: Secondary | ICD-10-CM | POA: Diagnosis not present

## 2022-05-17 LAB — BASIC METABOLIC PANEL
Anion gap: 10 (ref 5–15)
BUN: 23 mg/dL (ref 8–23)
CO2: 20 mmol/L — ABNORMAL LOW (ref 22–32)
Calcium: 9.5 mg/dL (ref 8.9–10.3)
Chloride: 106 mmol/L (ref 98–111)
Creatinine, Ser: 1.61 mg/dL — ABNORMAL HIGH (ref 0.44–1.00)
GFR, Estimated: 33 mL/min — ABNORMAL LOW (ref >60)
Glucose, Bld: 102 mg/dL — ABNORMAL HIGH (ref 70–99)
Potassium: 4.3 mmol/L (ref 3.5–5.1)
Sodium: 136 mmol/L (ref 135–145)

## 2022-05-25 ENCOUNTER — Other Ambulatory Visit: Payer: Self-pay | Admitting: *Deleted

## 2022-05-25 ENCOUNTER — Telehealth: Payer: Self-pay | Admitting: *Deleted

## 2022-05-25 ENCOUNTER — Other Ambulatory Visit: Payer: Self-pay | Admitting: Gastroenterology

## 2022-05-25 ENCOUNTER — Encounter: Payer: Self-pay | Admitting: *Deleted

## 2022-05-25 DIAGNOSIS — Z1211 Encounter for screening for malignant neoplasm of colon: Secondary | ICD-10-CM

## 2022-05-25 MED ORDER — ONDANSETRON 4 MG PO TBDP: 4.0000 mg | ORAL_TABLET | Freq: Three times a day (TID) | ORAL | 0 refills | Status: AC | PRN

## 2022-05-25 NOTE — Telephone Encounter (Signed)
Pt called and stated that she has was doing fine with the Trilyte prep at first then she started vomiting it up. She says she tried to hold it in but couldn't and when she goes to the bathroom it's not clear so she knows she isn't cleaned out. Her last ov stated to do Trilyte. Please advise. Thank you

## 2022-05-25 NOTE — Telephone Encounter (Signed)
Pt informed of providers message and instructions. Pt has been rescheduled for 07/10/22. New instructions mailed to pt and prep sent to the pharmacy.

## 2022-05-25 NOTE — Telephone Encounter (Signed)
LMTRC

## 2022-06-05 DIAGNOSIS — N1832 Chronic kidney disease, stage 3b: Secondary | ICD-10-CM | POA: Diagnosis not present

## 2022-06-05 DIAGNOSIS — I10 Essential (primary) hypertension: Secondary | ICD-10-CM | POA: Diagnosis not present

## 2022-06-06 ENCOUNTER — Other Ambulatory Visit (HOSPITAL_COMMUNITY): Payer: Self-pay | Admitting: Nephrology

## 2022-06-06 DIAGNOSIS — N1832 Chronic kidney disease, stage 3b: Secondary | ICD-10-CM

## 2022-06-14 ENCOUNTER — Other Ambulatory Visit: Payer: Self-pay | Admitting: Internal Medicine

## 2022-06-14 DIAGNOSIS — R600 Localized edema: Secondary | ICD-10-CM

## 2022-06-14 DIAGNOSIS — K219 Gastro-esophageal reflux disease without esophagitis: Secondary | ICD-10-CM

## 2022-07-03 ENCOUNTER — Other Ambulatory Visit (HOSPITAL_COMMUNITY)
Admission: RE | Admit: 2022-07-03 | Discharge: 2022-07-03 | Disposition: A | Discharge status: Home / Self Care | Payer: Medicare PPO | Source: Ambulatory Visit | Attending: Internal Medicine | Admitting: Internal Medicine

## 2022-07-03 DIAGNOSIS — Z1211 Encounter for screening for malignant neoplasm of colon: Secondary | ICD-10-CM

## 2022-07-03 LAB — BASIC METABOLIC PANEL
Anion gap: 10 (ref 5–15)
BUN: 23 mg/dL (ref 8–23)
CO2: 20 mmol/L — ABNORMAL LOW (ref 22–32)
Calcium: 9.3 mg/dL (ref 8.9–10.3)
Chloride: 105 mmol/L (ref 98–111)
Creatinine, Ser: 1.6 mg/dL — ABNORMAL HIGH (ref 0.44–1.00)
GFR, Estimated: 34 mL/min — ABNORMAL LOW (ref >60)
Glucose, Bld: 106 mg/dL — ABNORMAL HIGH (ref 70–99)
Potassium: 4.4 mmol/L (ref 3.5–5.1)
Sodium: 135 mmol/L (ref 135–145)

## 2022-07-04 ENCOUNTER — Telehealth: Payer: Self-pay | Admitting: *Deleted

## 2022-07-04 MED ORDER — PEG 3350-KCL-NA BICARB-NACL 420 G PO SOLR: 4000.0000 mL | Freq: Once | ORAL | 0 refills | Status: AC

## 2022-07-04 NOTE — Telephone Encounter (Signed)
Pt called in. When she was rescheduled her prep was not sent in. I advised pt will resend in again. Confirmed pharmacy.

## 2022-07-10 ENCOUNTER — Other Ambulatory Visit: Payer: Self-pay

## 2022-07-10 ENCOUNTER — Encounter (HOSPITAL_COMMUNITY)
Admission: RE | Disposition: A | Discharge status: Home / Self Care | Payer: Self-pay | Source: Home / Self Care | Attending: Internal Medicine

## 2022-07-10 ENCOUNTER — Ambulatory Visit (HOSPITAL_BASED_OUTPATIENT_CLINIC_OR_DEPARTMENT_OTHER): Payer: Medicare PPO | Admitting: Anesthesiology

## 2022-07-10 ENCOUNTER — Ambulatory Visit (HOSPITAL_COMMUNITY)
Admission: RE | Admit: 2022-07-10 | Discharge: 2022-07-10 | Disposition: A | Discharge status: Home / Self Care | Payer: Medicare PPO | Attending: Internal Medicine | Admitting: Internal Medicine

## 2022-07-10 ENCOUNTER — Encounter (HOSPITAL_COMMUNITY): Payer: Self-pay | Admitting: Internal Medicine

## 2022-07-10 ENCOUNTER — Ambulatory Visit (HOSPITAL_COMMUNITY): Payer: Medicare PPO | Admitting: Anesthesiology

## 2022-07-10 DIAGNOSIS — Z1211 Encounter for screening for malignant neoplasm of colon: Secondary | ICD-10-CM | POA: Insufficient documentation

## 2022-07-10 DIAGNOSIS — K219 Gastro-esophageal reflux disease without esophagitis: Secondary | ICD-10-CM | POA: Diagnosis not present

## 2022-07-10 DIAGNOSIS — K5730 Diverticulosis of large intestine without perforation or abscess without bleeding: Secondary | ICD-10-CM | POA: Diagnosis not present

## 2022-07-10 DIAGNOSIS — D122 Benign neoplasm of ascending colon: Secondary | ICD-10-CM | POA: Diagnosis not present

## 2022-07-10 DIAGNOSIS — D124 Benign neoplasm of descending colon: Secondary | ICD-10-CM | POA: Insufficient documentation

## 2022-07-10 DIAGNOSIS — I10 Essential (primary) hypertension: Secondary | ICD-10-CM | POA: Diagnosis not present

## 2022-07-10 DIAGNOSIS — Z87891 Personal history of nicotine dependence: Secondary | ICD-10-CM | POA: Diagnosis not present

## 2022-07-10 DIAGNOSIS — D126 Benign neoplasm of colon, unspecified: Secondary | ICD-10-CM

## 2022-07-10 DIAGNOSIS — I351 Nonrheumatic aortic (valve) insufficiency: Secondary | ICD-10-CM | POA: Diagnosis not present

## 2022-07-10 DIAGNOSIS — K635 Polyp of colon: Secondary | ICD-10-CM | POA: Diagnosis not present

## 2022-07-10 HISTORY — PX: POLYPECTOMY: SHX5525

## 2022-07-10 HISTORY — PX: COLONOSCOPY WITH PROPOFOL: SHX5780

## 2022-07-10 SURGERY — COLONOSCOPY WITH PROPOFOL
Anesthesia: General

## 2022-07-10 MED ORDER — LIDOCAINE HCL (CARDIAC) PF 100 MG/5ML IV SOSY
PREFILLED_SYRINGE | INTRAVENOUS | Status: DC | PRN
  Administered 2022-07-10: 50 mg via INTRAVENOUS

## 2022-07-10 MED ORDER — STERILE WATER FOR IRRIGATION IR SOLN
Status: DC | PRN
  Administered 2022-07-10: .6 mL

## 2022-07-10 MED ORDER — PHENYLEPHRINE 80 MCG/ML (10ML) SYRINGE FOR IV PUSH (FOR BLOOD PRESSURE SUPPORT)
PREFILLED_SYRINGE | INTRAVENOUS | Status: DC | PRN
  Administered 2022-07-10 (×2): 160 ug via INTRAVENOUS

## 2022-07-10 MED ORDER — LACTATED RINGERS IV SOLN: INTRAVENOUS | Status: DC

## 2022-07-10 MED ORDER — PROPOFOL 10 MG/ML IV BOLUS
INTRAVENOUS | Status: DC | PRN
  Administered 2022-07-10: 100 mg via INTRAVENOUS

## 2022-07-10 MED ORDER — PROPOFOL 500 MG/50ML IV EMUL
INTRAVENOUS | Status: DC | PRN
  Administered 2022-07-10: 150 ug/kg/min via INTRAVENOUS

## 2022-07-10 MED ORDER — PHENYLEPHRINE 80 MCG/ML (10ML) SYRINGE FOR IV PUSH (FOR BLOOD PRESSURE SUPPORT)
PREFILLED_SYRINGE | INTRAVENOUS | Status: AC
  Filled 2022-07-10: qty 10

## 2022-07-10 NOTE — Discharge Instructions (Addendum)
  Colonoscopy Discharge Instructions  Read the instructions outlined below and refer to this sheet in the next few weeks. These discharge instructions provide you with general information on caring for yourself after you leave the hospital. Your doctor may also give you specific instructions. While your treatment has been planned according to the most current medical practices available, unavoidable complications occasionally occur. If you have any problems or questions after discharge, call Dr. Jena Gauss at [phone]. ACTIVITY You may resume your regular activity, but move at a slower pace for the next 24 hours.  Take frequent rest periods for the next 24 hours.  Walking will help get rid of the air and reduce the bloated feeling in your belly (abdomen).  No driving for 24 hours (because of the medicine (anesthesia) used during the test).   Do not sign any important legal documents or operate any machinery for 24 hours (because of the anesthesia used during the test).  NUTRITION Drink plenty of fluids.  You may resume your normal diet as instructed by your doctor.  Begin with a light meal and progress to your normal diet. Heavy or fried foods are harder to digest and may make you feel sick to your stomach (nauseated).  Avoid alcoholic beverages for 24 hours or as instructed.  MEDICATIONS You may resume your normal medications unless your doctor tells you otherwise.  WHAT YOU CAN EXPECT TODAY Some feelings of bloating in the abdomen.  Passage of more gas than usual.  Spotting of blood in your stool or on the toilet paper.  IF YOU HAD POLYPS REMOVED DURING THE COLONOSCOPY: No aspirin products for 7 days or as instructed.  No alcohol for 7 days or as instructed.  Eat a soft diet for the next 24 hours.  FINDING OUT THE RESULTS OF YOUR TEST Not all test results are available during your visit. If your test results are not back during the visit, make an appointment with your caregiver to find out the  results. Do not assume everything is normal if you have not heard from your caregiver or the medical facility. It is important for you to follow up on all of your test results.  SEEK IMMEDIATE MEDICAL ATTENTION IF: You have more than a spotting of blood in your stool.  Your belly is swollen (abdominal distention).  You are nauseated or vomiting.  You have a temperature over 101.  You have abdominal pain or discomfort that is severe or gets worse throughout the day.     3 polyps removed from your colon-they were all small  Further recommendations to follow pending review of pathology report  Colon polyp and diverticulosis information provided

## 2022-07-10 NOTE — Anesthesia Procedure Notes (Signed)
Date/Time: 07/10/2022 9:22 AM  Performed by: Julian Reil, CRNAPre-anesthesia Checklist: Patient identified, Emergency Drugs available, Suction available and Patient being monitored Patient Re-evaluated:Patient Re-evaluated prior to induction Oxygen Delivery Method: Nasal cannula Induction Type: IV induction Placement Confirmation: positive ETCO2

## 2022-07-10 NOTE — Transfer of Care (Signed)
Immediate Anesthesia Transfer of Care Note  Patient: Robin Villa  Procedure(s) Performed: COLONOSCOPY WITH PROPOFOL POLYPECTOMY  Patient Location: Endoscopy Unit  Anesthesia Type:General  Level of Consciousness: awake  Airway & Oxygen Therapy: Patient Spontanous Breathing  Post-op Assessment: Report given to RN and Post -op Vital signs reviewed and stable  Post vital signs: Reviewed and stable  Last Vitals:  Vitals Value Taken Time  BP    Temp 36.4 C 07/10/22 0937  Pulse 67 07/10/22 0937  Resp    SpO2 98 % 07/10/22 0937    Last Pain:  Vitals:   07/10/22 0937  TempSrc: Oral  PainSc: 0-No pain      Patients Stated Pain Goal: 6 (07/10/22 0829)  Complications: No notable events documented.

## 2022-07-10 NOTE — Op Note (Signed)
[facility] Patient Name: Robin Villa Procedure Date: 07/10/2022 9:01 AM MRN: [id number] Date of Birth: 04-Dec-1950 Attending MD: Gennette Pac , MD, [id number] CSN: [id number] Age: 72 Admit Type: Outpatient Procedure:                Colonoscopy Indications:              Screening for colorectal malignant neoplasm Providers:                Gennette Pac, MD, Jannett Celestine, RN, Dyann Ruddle Referring MD:              Medicines:                Propofol per Anesthesia Complications:            No immediate complications. Estimated Blood Loss:     Estimated blood loss was minimal. Procedure:                Pre-Anesthesia Assessment:                           - Prior to the procedure, a History and Physical                            was performed, and patient medications and                            allergies were reviewed. The patient's tolerance of                            previous anesthesia was also reviewed. The risks                            and benefits of the procedure and the sedation                            options and risks were discussed with the patient.                            All questions were answered, and informed consent                            was obtained. Prior Anticoagulants: The patient has                            taken no anticoagulant or antiplatelet agents. ASA                            Grade Assessment: III - A patient with severe                            systemic disease. After reviewing the risks and  benefits, the patient was deemed in satisfactory                            condition to undergo the procedure.                           After obtaining informed consent, the colonoscope                            was passed under direct vision. Throughout the                            procedure, the patient's blood pressure, pulse, and                            oxygen  saturations were monitored continuously. The                            [id number]) scope was introduced through the                            anus and advanced to the the cecum, identified by                            appendiceal orifice and ileocecal valve. The                            colonoscopy was performed without difficulty. The                            patient tolerated the procedure well. The quality                            of the bowel preparation was adequate. The                            ileocecal valve, appendiceal orifice, and rectum                            were photographed. Scope In: 9:19:32 AM Scope Out: 9:35:19 AM Scope Withdrawal Time: 0 hours 10 minutes 4 seconds  Total Procedure Duration: 0 hours 15 minutes 47 seconds  Findings:      The perianal and digital rectal examinations were normal.      Three sessile polyps were found in the descending colon and ascending       colon. The polyps were 4 to 5 mm in size. These polyps were removed with       a cold snare. Resection and retrieval were complete. Estimated blood       loss was minimal.      Many medium-mouthed diverticula were found in the entire colon.      The exam was otherwise without abnormality on direct and retroflexion       views. Impression:               - Three 4 to 5 mm polyps  in the descending colon                            and in the ascending colon, removed with a cold                            snare. Resected and retrieved.                           - Diverticulosis in the entire examined colon.                           - The examination was otherwise normal on direct                            and retroflexion views. Moderate Sedation:      Moderate (conscious) sedation was personally administered by an       anesthesia professional. The following parameters were monitored: oxygen       saturation, heart rate, blood pressure, and response to care. Recommendation:            - Patient has a contact number available for                            emergencies. The signs and symptoms of potential                            delayed complications were discussed with the                            patient. Return to normal activities tomorrow.                            Written discharge instructions were provided to the                            patient.                           - Advance diet as tolerated.                           - Continue present medications.                           - Repeat colonoscopy date to be determined after                            pending pathology results are reviewed for                            surveillance.                           - Return to GI office (date not yet determined). Procedure Code(s):        --- Professional ---  13086, Colonoscopy, flexible; with removal of                            tumor(s), polyp(s), or other lesion(s) by snare                            technique Diagnosis Code(s):        --- Professional ---                           Z12.11, Encounter for screening for malignant                            neoplasm of colon                           D12.4, Benign neoplasm of descending colon                           D12.2, Benign neoplasm of ascending colon                           K57.30, Diverticulosis of large intestine without                            perforation or abscess without bleeding CPT copyright 2022 American Medical Association. All rights reserved. The codes documented in this report are preliminary and upon coder review may  be revised to meet current compliance requirements. [name], MD Gennette Pac, MD 07/10/2022 9:52:17 AM This report has been signed electronically. Number of Addenda: 0

## 2022-07-10 NOTE — Anesthesia Preprocedure Evaluation (Addendum)
Anesthesia Evaluation  Patient identified by MRN, date of birth, ID band Patient awake    Reviewed: Allergy & Precautions, H&P , NPO status , Patient's Chart, lab work & pertinent test results  Airway Mallampati: II  TM Distance: >3 FB Neck ROM: Full    Dental  (+) Upper Dentures, Lower Dentures   Pulmonary former smoker   Pulmonary exam normal breath sounds clear to auscultation       Cardiovascular hypertension, Pt. on medications + Valvular Problems/Murmurs AI  Rhythm:Regular Rate:Normal + Systolic murmurs Anabel Halon, MD 08/11/2021  5:32 PM EDT   Please advise her that her Echo showed adequate pumping function. Mild to moderate leaky aortic valve. If she has persistent leg swelling or other new symptoms, we will refer her to Cardiology.     Neuro/Psych negative neurological ROS  negative psych ROS   GI/Hepatic Neg liver ROS,GERD  Medicated and Controlled,,  Endo/Other  negative endocrine ROS    Renal/GU Renal InsufficiencyRenal disease  negative genitourinary   Musculoskeletal  (+) Arthritis ,    Abdominal   Peds negative pediatric ROS (+)  Hematology negative hematology ROS (+)   Anesthesia Other Findings   Reproductive/Obstetrics negative OB ROS                              Anesthesia Physical Anesthesia Plan  ASA: 2  Anesthesia Plan: General   Post-op Pain Management: Minimal or no pain anticipated   Induction: Intravenous  PONV Risk Score and Plan: 1 and Propofol infusion  Airway Management Planned: Nasal Cannula and Natural Airway  Additional Equipment:   Intra-op Plan:   Post-operative Plan:   Informed Consent: I have reviewed the patients History and Physical, chart, labs and discussed the procedure including the risks, benefits and alternatives for the proposed anesthesia with the patient or authorized representative who has indicated his/her understanding  and acceptance.     Dental advisory given  Plan Discussed with: CRNA and Surgeon  Anesthesia Plan Comments:         Anesthesia Quick Evaluation

## 2022-07-10 NOTE — H&P (Signed)
@LOGO @   Primary Care Physician:  Anabel Halon, MD Primary Gastroenterologist:  Dr. Jena Gauss  Pre-Procedure History & Physical: HPI:  Robin Villa is a 72 y.o. female here for   Screening colonoscopy.  Average risk.  Attempted colonoscopy couple of months ago thwarted by poor prep.  Incomplete.   prior colonoscopy 10 years ago demonstrated hyperplastic polyps and pancolonic diverticulosis.  Past Medical History:  Diagnosis Date   Arthritis    Heart murmur    asymptomatic   Hypercholesteremia    Hypertension     Past Surgical History:  Procedure Laterality Date   CARPAL TUNNEL RELEASE     bilateral    CATARACT EXTRACTION W/PHACO Right 09/07/2014   Procedure: CATARACT EXTRACTION PHACO AND INTRAOCULAR LENS PLACEMENT RIGHT EYE CDE=6.10;  Surgeon: Gemma Payor, MD;  Location: [facility];  Service: Ophthalmology;  Laterality: Right;   COLONOSCOPY  09/15/2011   Procedure: COLONOSCOPY;  Surgeon: West Bali, MD;  Location: [facility];  Service: Endoscopy;  Laterality: N/A;  11:00AM   FLEXIBLE SIGMOIDOSCOPY N/A 03/09/2022   Procedure: FLEXIBLE SIGMOIDOSCOPY;  Surgeon: Corbin Ade, MD;  Location: [facility];  Service: Endoscopy;  Laterality: N/A;   TOTAL KNEE ARTHROPLASTY Right 04/09/2018   Procedure: TOTAL KNEE ARTHROPLASTY;  Surgeon: Vickki Hearing, MD;  Location: [facility];  Service: Orthopedics;  Laterality: Right;   TUBAL LIGATION      Prior to Admission medications   Medication Sig Start Date End Date Taking? Authorizing Provider  acetaminophen (TYLENOL) 500 MG tablet Take 1,000 mg by mouth every 6 (six) hours as needed for moderate pain.   Yes [provider]  amLODipine (NORVASC) 10 MG tablet Take 1 tablet (10 mg total) by mouth daily. 04/10/22  Yes Anabel Halon, MD  aspirin 81 MG tablet Take 1 tablet (81 mg total) by mouth daily. 03/22/15  Yes [place], Velna Hatchet, MD  furosemide (LASIX) 20 MG tablet Take 1 tablet by mouth once daily 06/15/22  Yes Anabel Halon, MD   hydrALAZINE (APRESOLINE) 25 MG tablet Take 1 tablet (25 mg total) by mouth 2 (two) times daily. 04/10/22  Yes Anabel Halon, MD  ibuprofen (ADVIL) 200 MG tablet Take 400 mg by mouth every 6 (six) hours as needed for moderate pain.   Yes [provider]  ondansetron (ZOFRAN-ODT) 4 MG disintegrating tablet Take 1 tablet (4 mg total) by mouth every 8 (eight) hours as needed for nausea or vomiting. 05/25/22  Yes Aida Raider, NP  pantoprazole (PROTONIX) 40 MG tablet Take 1 tablet by mouth once daily 06/15/22  Yes Anabel Halon, MD  simvastatin (ZOCOR) 20 MG tablet Take 1 tablet (20 mg total) by mouth at bedtime. 04/10/22  Yes Anabel Halon, MD  telmisartan (MICARDIS) 40 MG tablet Take 1 tablet (40 mg total) by mouth daily. 04/10/22  Yes Anabel Halon, MD  VITAMIN D PO Take 1 capsule by mouth daily.   Yes [provider]  Misc. Devices MISC BP cuff/divide - ICD10: I10 11/25/20   Anabel Halon, MD    Allergies as of 04/21/2022   (No Known Allergies)    Family History  Problem Relation Age of Onset   Heart disease Father    Stroke Father    Hypertension Sister    Hypertension Sister    Heart disease Brother    Hypertension Mother     Social History   Socioeconomic History   Marital status: Widowed  Spouse name: Not on file   Number of children: 3   Years of education: Not on file   Highest education level: Not on file  Occupational History   Occupation: retired, Photographer: [employer]  Tobacco Use   Smoking status: Former    Packs/day: 0.30    Years: 35.00    Additional pack years: 0.00    Total pack years: 10.50    Types: Cigarettes    Quit date: 08/22/1996    Years since quitting: 25.8   Smokeless tobacco: Never  Vaping Use   Vaping Use: Never used  Substance and Sexual Activity   Alcohol use: No   Drug use: No   Sexual activity: Yes    Birth control/protection: Post-menopausal  Other Topics Concern   Not on file   Social History Narrative   Not on file   Social Determinants of Health   Financial Resource Strain: Low Risk  (08/26/2021)   Overall Financial Resource Strain (CARDIA)    Difficulty of Paying Living Expenses: Not hard at all  Food Insecurity: No Food Insecurity (08/26/2021)   Hunger Vital Sign    Worried About Running Out of Food in the Last Year: Never true    Ran Out of Food in the Last Year: Never true  Transportation Needs: No Transportation Needs (08/26/2021)   PRAPARE - Administrator, Civil Service (Medical): No    Lack of Transportation (Non-Medical): No  Physical Activity: Insufficiently Active (08/26/2021)   Exercise Vital Sign    Days of Exercise per Week: 2 days    Minutes of Exercise per Session: 30 min  Stress: No Stress Concern Present (08/26/2021)   [employer] of Occupational Health - Occupational Stress Questionnaire    Feeling of Stress : Not at all  Social Connections: Moderately Isolated (08/26/2021)   Social Connection and Isolation Panel [NHANES]    Frequency of Communication with Friends and Family: More than three times a week    Frequency of Social Gatherings with Friends and Family: Three times a week    Attends Religious Services: More than 4 times per year    Active Member of Clubs or Organizations: No    Attends Banker Meetings: Never    Marital Status: Widowed  Intimate Partner Violence: Not At Risk (08/26/2021)   Humiliation, Afraid, Rape, and Kick questionnaire    Fear of Current or Ex-Partner: No    Emotionally Abused: No    Physically Abused: No    Sexually Abused: No    Review of Systems: See HPI, otherwise negative ROS  Physical Exam: BP 134/65   Pulse 75   Temp 97.7 F (36.5 C) (Oral)   Resp 15   Ht 5\' 5"  (1.651 m)   Wt 90.7 kg   SpO2 99%   BMI 33.28 kg/m  General:   Alert,  Well-developed, well-nourished, pleasant and cooperative in NAD Neck:  Supple; no masses or thyromegaly. No significant  cervical adenopathy. Lungs:  Clear throughout to auscultation.   No wheezes, crackles, or rhonchi. No acute distress. Heart:  Regular rate and rhythm; no murmurs, clicks, rubs,  or gallops. Abdomen: Non-distended, normal bowel sounds.  Soft and nontender without appreciable mass or hepatosplenomegaly.   Impression/Plan:    72 year old lady here for average rescreening colonoscopy attempt earlier this year was thwarted by her inadequate preparation.  Hopefully, today she is adequately prepped.  The risks, benefits, limitations, alternatives and imponderables have been reviewed  with the patient. Questions have been answered. All parties are agreeable.       Notice: This dictation was prepared with Dragon dictation along with smaller phrase technology. Any transcriptional errors that result from this process are unintentional and may not be corrected upon review.

## 2022-07-10 NOTE — Anesthesia Postprocedure Evaluation (Signed)
Anesthesia Post Note  Patient: Robin Villa  Procedure(s) Performed: COLONOSCOPY WITH PROPOFOL POLYPECTOMY  Patient location during evaluation: Phase II Anesthesia Type: General Level of consciousness: awake and alert and oriented Pain management: pain level controlled Vital Signs Assessment: post-procedure vital signs reviewed and stable Respiratory status: spontaneous breathing, nonlabored ventilation and respiratory function stable Cardiovascular status: blood pressure returned to baseline and stable Postop Assessment: no apparent nausea or vomiting Anesthetic complications: no  No notable events documented.   Last Vitals:  Vitals:   07/10/22 0937 07/10/22 0940  BP: (!) 104/41 122/75  Pulse: 67 70  Resp:  16  Temp: 36.4 C   SpO2: 98% 98%    Last Pain:  Vitals:   07/10/22 0940  TempSrc:   PainSc: 0-No pain                 Jasun Gasparini C Uriel Horkey

## 2022-07-11 ENCOUNTER — Encounter: Payer: Self-pay | Admitting: Internal Medicine

## 2022-07-11 LAB — SURGICAL PATHOLOGY

## 2022-07-12 ENCOUNTER — Encounter: Payer: Self-pay | Admitting: Internal Medicine

## 2022-07-17 ENCOUNTER — Ambulatory Visit (HOSPITAL_COMMUNITY)
Admission: RE | Admit: 2022-07-17 | Discharge: 2022-07-17 | Disposition: A | Discharge status: Home / Self Care | Payer: Medicare PPO | Source: Ambulatory Visit | Attending: Nephrology | Admitting: Nephrology

## 2022-07-17 DIAGNOSIS — N1832 Chronic kidney disease, stage 3b: Secondary | ICD-10-CM | POA: Diagnosis not present

## 2022-07-18 ENCOUNTER — Encounter (HOSPITAL_COMMUNITY): Payer: Self-pay | Admitting: Internal Medicine

## 2022-07-27 ENCOUNTER — Other Ambulatory Visit (HOSPITAL_COMMUNITY): Payer: Self-pay | Admitting: Internal Medicine

## 2022-07-27 DIAGNOSIS — Z1231 Encounter for screening mammogram for malignant neoplasm of breast: Secondary | ICD-10-CM

## 2022-07-31 ENCOUNTER — Ambulatory Visit (HOSPITAL_COMMUNITY)
Admission: RE | Admit: 2022-07-31 | Discharge: 2022-07-31 | Disposition: A | Discharge status: Home / Self Care | Payer: Medicare PPO | Source: Ambulatory Visit | Attending: Internal Medicine | Admitting: Internal Medicine

## 2022-07-31 DIAGNOSIS — Z1231 Encounter for screening mammogram for malignant neoplasm of breast: Secondary | ICD-10-CM

## 2022-08-29 ENCOUNTER — Ambulatory Visit (INDEPENDENT_AMBULATORY_CARE_PROVIDER_SITE_OTHER): Payer: Medicare PPO

## 2022-08-29 VITALS — BP 132/69 | HR 76 | Ht 65.0 in | Wt 200.6 lb

## 2022-08-29 DIAGNOSIS — Z0000 Encounter for general adult medical examination without abnormal findings: Secondary | ICD-10-CM | POA: Diagnosis not present

## 2022-08-29 NOTE — Patient Instructions (Signed)

## 2022-08-29 NOTE — Progress Notes (Signed)
Subjective:   Robin Villa is a 72 y.o. female who presents for Medicare Annual (Subsequent) preventive examination.  Visit Complete: In person  Patient Medicare AWV questionnaire was completed by the patient on 08/29/2022; I have confirmed that all information answered by patient is correct and no changes since this date.  Review of Systems     Robin Villa , Thank you for taking time to come for your Medicare Wellness Visit. I appreciate your ongoing commitment to your health goals. Please review the following plan we discussed and let me know if I can assist you in the future.   These are the goals we discussed:  Goals      Patient Stated     Patient would like to take  more vacations      Patient Stated     Take day by day.        This is a list of the screening recommended for you and due dates:  Health Maintenance  Topic Date Due   COVID-19 Vaccine (3 - 2023-24 season) 09/30/2021   Flu Shot  08/31/2022   DTaP/Tdap/Td vaccine (3 - Td or Tdap) 01/21/2023   Medicare Annual Wellness Visit  08/29/2023   Mammogram  07/30/2024   Colon Cancer Screening  07/09/2032   Pneumonia Vaccine  Completed   DEXA scan (bone density measurement)  Completed   Hepatitis C Screening  Completed   Zoster (Shingles) Vaccine  Completed   HPV Vaccine  Aged Out    Cardiac Risk Factors include: advanced age (>89men, >37 women);hypertension     Objective:    Today's Vitals   08/29/22 1310  BP: 132/69  Pulse: 76  SpO2: 97%  Weight: 200 lb 9.6 oz (91 kg)  Height: 5\' 5"  (1.651 m)   Body mass index is 33.38 kg/m.     08/29/2022    1:14 PM 07/10/2022    8:18 AM 03/09/2022   10:30 AM 08/26/2021   10:43 AM 08/24/2020    9:54 AM 06/02/2019    3:02 PM 04/09/2018   12:30 PM  Advanced Directives  Does Patient Have a Medical Advance Directive? No No No No No No No  Does patient want to make changes to medical advance directive?       No - Patient declined  Would patient like information on creating  a medical advance directive? No - Patient declined Yes (MAU/Ambulatory/Procedural Areas - Information given) Yes (MAU/Ambulatory/Procedural Areas - Information given) Yes (Inpatient - patient defers creating a medical advance directive at this time - Information given) Yes (MAU/Ambulatory/Procedural Areas - Information given) Yes (MAU/Ambulatory/Procedural Areas - Information given) No - Patient declined    Current Medications (verified) Outpatient Encounter Medications as of 08/29/2022  Medication Sig   acetaminophen (TYLENOL) 500 MG tablet Take 1,000 mg by mouth every 6 (six) hours as needed for moderate pain.   amLODipine (NORVASC) 10 MG tablet Take 1 tablet (10 mg total) by mouth daily.   aspirin 81 MG tablet Take 1 tablet (81 mg total) by mouth daily.   furosemide (LASIX) 20 MG tablet Take 1 tablet by mouth once daily   hydrALAZINE (APRESOLINE) 25 MG tablet Take 1 tablet (25 mg total) by mouth 2 (two) times daily.   ibuprofen (ADVIL) 200 MG tablet Take 400 mg by mouth every 6 (six) hours as needed for moderate pain.   Misc. Devices MISC BP cuff/divide - ICD10: I10   ondansetron (ZOFRAN-ODT) 4 MG disintegrating tablet Take 1 tablet (4 mg  total) by mouth every 8 (eight) hours as needed for nausea or vomiting.   pantoprazole (PROTONIX) 40 MG tablet Take 1 tablet by mouth once daily   simvastatin (ZOCOR) 20 MG tablet Take 1 tablet (20 mg total) by mouth at bedtime.   telmisartan (MICARDIS) 40 MG tablet Take 1 tablet (40 mg total) by mouth daily.   VITAMIN D PO Take 1 capsule by mouth daily.   No facility-administered encounter medications on file as of 08/29/2022.    Allergies (verified) Patient has no known allergies.   History: Past Medical History:  Diagnosis Date   Arthritis    Heart murmur    asymptomatic   Hypercholesteremia    Hypertension    Past Surgical History:  Procedure Laterality Date   CARPAL TUNNEL RELEASE     bilateral    CATARACT EXTRACTION W/PHACO Right  09/07/2014   Procedure: CATARACT EXTRACTION PHACO AND INTRAOCULAR LENS PLACEMENT RIGHT EYE CDE=6.10;  Surgeon: Gemma Payor, MD;  Location: [facility];  Service: Ophthalmology;  Laterality: Right;   COLONOSCOPY  09/15/2011   Procedure: COLONOSCOPY;  Surgeon: West Bali, MD;  [facility]: [facility];  Service: Endoscopy;  Laterality: N/A;  11:00AM   COLONOSCOPY WITH PROPOFOL N/A 07/10/2022   Procedure: COLONOSCOPY WITH PROPOFOL;  Surgeon: Corbin Ade, MD;  [facility]: [facility];  Service: Endoscopy;  Laterality: N/A;  1245pm, asa 2   FLEXIBLE SIGMOIDOSCOPY N/A 03/09/2022   Procedure: FLEXIBLE SIGMOIDOSCOPY;  Surgeon: Corbin Ade, MD;  [facility];  Service: Endoscopy;  Laterality: N/A;   POLYPECTOMY  07/10/2022   Procedure: POLYPECTOMY;  Surgeon: Corbin Ade, MD;  [facility]: [facility];  Service: Endoscopy;;   TOTAL KNEE ARTHROPLASTY Right 04/09/2018   Procedure: TOTAL KNEE ARTHROPLASTY;  Surgeon: Vickki Hearing, MD;  Location: [facility];  Service: Orthopedics;  Laterality: Right;   TUBAL LIGATION     Family History  Problem Relation Age of Onset   Heart disease Father    Stroke Father    Hypertension Sister    Hypertension Sister    Heart disease Brother    Hypertension Mother    Social History   Socioeconomic History   Marital status: Widowed    Spouse name: Not on file   Number of children: 3   Years of education: Not on file   Highest education level: Not on file  Occupational History   Occupation: retired, Photographer: [employer]  Tobacco Use   Smoking status: Former    Current packs/day: 0.00    Average packs/day: 0.3 packs/day for 35.0 years (10.5 ttl pk-yrs)    Types: Cigarettes    Start date: 08/22/1961    Quit date: 08/22/1996    Years since quitting: 26.0   Smokeless tobacco: Never  Vaping Use   Vaping status: Never Used  Substance and Sexual Activity   Alcohol use: No   Drug use: No   Sexual activity: Yes    Birth  control/protection: Post-menopausal  Other Topics Concern   Not on file  Social History Narrative   Not on file   Social Determinants of Health   Financial Resource Strain: Low Risk  (08/26/2021)   Overall Financial Resource Strain (CARDIA)    Difficulty of Paying Living Expenses: Not hard at all  Food Insecurity: No Food Insecurity (08/29/2022)   Hunger Vital Sign    Worried About Running Out of Food in the Last Year: Never true  Ran Out of Food in the Last Year: Never true  Transportation Needs: No Transportation Needs (08/29/2022)   PRAPARE - Administrator, Civil Service (Medical): No    Lack of Transportation (Non-Medical): No  Physical Activity: Sufficiently Active (08/29/2022)   Exercise Vital Sign    Days of Exercise per Week: 7 days    Minutes of Exercise per Session: 50 min  Stress: No Stress Concern Present (08/29/2022)   [employer] of Occupational Health - Occupational Stress Questionnaire    Feeling of Stress : Not at all  Social Connections: Moderately Isolated (08/29/2022)   Social Connection and Isolation Panel [NHANES]    Frequency of Communication with Friends and Family: More than three times a week    Frequency of Social Gatherings with Friends and Family: Twice a week    Attends Religious Services: More than 4 times per year    Active Member of [employer] or Organizations: No    Attends Banker Meetings: Never    Marital Status: Widowed    Tobacco Counseling Counseling given: Not Answered   Clinical Intake:     Pain : No/denies pain     BMI - recorded: 33.68 Nutritional Status: BMI > 30  Obese Diabetes: No  How often do you need to have someone help you when you read instructions, pamphlets, or other written materials from your doctor or pharmacy?: 1 - Never What is the last grade level you completed in school?: 12th grade         Activities of Daily Living    08/29/2022    1:11 PM  In your present state of  health, do you have any difficulty performing the following activities:  Hearing? 0  Vision? 0  Difficulty concentrating or making decisions? 0  Walking or climbing stairs? 0  Dressing or bathing? 0  Doing errands, shopping? 0  Preparing Food and eating ? N  Using the Toilet? N  In the past six months, have you accidently leaked urine? N  Do you have problems with loss of bowel control? N  Managing your Medications? N  Managing your Finances? N  Housekeeping or managing your Housekeeping? N    Patient Care Team: Anabel Halon, MD as PCP - General (Internal Medicine) West Bali, MD (Inactive) (Gastroenterology)  Indicate any recent Medical Services you may have received from other than [facility] providers in the past year (date may be approximate).     Assessment:   This is a routine wellness examination for Laurian.  Hearing/Vision screen No results found.  Dietary issues and exercise activities discussed:     Goals Addressed             This Visit's Progress    Patient Stated   On track    Patient would like to take  more vacations      Patient Stated       Take day by day.      Depression Screen    08/29/2022    1:15 PM 08/29/2022    1:13 PM 04/10/2022    3:40 PM 12/09/2021   10:55 AM 08/26/2021   10:43 AM 08/26/2021   10:41 AM 06/13/2021    2:35 PM  PHQ 2/9 Scores  PHQ - 2 Score 0 0 0 0 0 0 0    Fall Risk    08/29/2022    1:14 PM 04/10/2022    3:40 PM 12/09/2021   10:55 AM 08/26/2021   10:43  AM 06/13/2021    2:35 PM  Fall Risk   Falls in the past year? 0 0 0 0 0  Number falls in past yr: 0 0 0 0 0  Injury with Fall? 0 0 0 0 0  Risk for fall due to : No Fall Risks  No Fall Risks No Fall Risks No Fall Risks  Follow up Falls evaluation completed  Falls evaluation completed Falls evaluation completed Falls evaluation completed    MEDICARE RISK AT HOME:  Medicare Risk at Home - 08/29/22 1314     Any stairs in or around the home? Yes    If so, are  there any without handrails? No    Home free of loose throw rugs in walkways, pet beds, electrical cords, etc? Yes    Adequate lighting in your home to reduce risk of falls? Yes    Life alert? No    Use of a cane, walker or w/c? No    Grab bars in the bathroom? No    Shower chair or bench in shower? No    Elevated toilet seat or a handicapped toilet? No             TIMED UP AND GO:  Was the test performed?  Yes  Length of time to ambulate 10 feet: 10 sec Gait steady and fast without use of assistive device    Cognitive Function:    08/24/2020    9:55 AM  MMSE - Mini Mental State Exam  Not completed: Unable to complete        08/29/2022    1:15 PM 08/26/2021   10:44 AM 08/24/2020    9:55 AM  6CIT Screen  What Year? 0 points 0 points 0 points  What month? 0 points 0 points 0 points  What time? 0 points 0 points 0 points  Count back from 20 0 points 0 points 0 points  Months in reverse 0 points 0 points 0 points  Repeat phrase 2 points 0 points 4 points  Total Score 2 points 0 points 4 points    Immunizations Immunization History  Administered Date(s) Administered   Fluad Quad(high Dose 65+) 10/01/2018, 12/22/2019, 11/25/2020, 12/09/2021   Hepatitis B 02/27/2008, 04/03/2008   Influenza, High Dose Seasonal PF 01/31/2017, 12/21/2017   Influenza,inj,Quad PF,6+ Mos 02/23/2015   PFIZER(Purple Top)SARS-COV-2 Vaccination 03/31/2019, 05/01/2019   Pneumococcal Conjugate-13 01/21/2014   Pneumococcal Polysaccharide-23 02/23/2015   Td 01/20/2013   Tdap 01/20/2013   Zoster Recombinant(Shingrix) 05/31/2016, 01/31/2017    TDAP status: Up to date  Flu Vaccine status: Up to date  Pneumococcal vaccine status: Up to date  Covid-19 vaccine status: Completed vaccines  Qualifies for Shingles Vaccine? Yes   Zostavax completed Yes   Shingrix Completed?: Yes  Screening Tests Health Maintenance  Topic Date Due   COVID-19 Vaccine (3 - 2023-24 season) 09/30/2021   INFLUENZA  VACCINE  08/31/2022   DTaP/Tdap/Td (3 - Td or Tdap) 01/21/2023   Medicare Annual Wellness (AWV)  08/29/2023   MAMMOGRAM  07/30/2024   Colonoscopy  07/09/2032   Pneumonia Vaccine 35+ Years old  Completed   DEXA SCAN  Completed   Hepatitis C Screening  Completed   Zoster Vaccines- Shingrix  Completed   HPV VACCINES  Aged Out    Health Maintenance  Health Maintenance Due  Topic Date Due   COVID-19 Vaccine (3 - 2023-24 season) 09/30/2021    Colorectal cancer screening: Type of screening: Colonoscopy. Completed 07/10/2022. Repeat every 10 years  Mammogram status: Completed 07/31/2022. Repeat every year  Bone Density status: Completed 07/17/2016. Results reflect: Bone density results: OSTEOPOROSIS. Repeat every 2 years.  Lung Cancer Screening: (Low Dose CT Chest recommended if Age 54-80 years, 20 pack-year currently smoking OR have quit w/in 15years.) does not qualify.   Lung Cancer Screening Referral:   Additional Screening:  Hepatitis C Screening: does qualify; Completed 08/18/2015  Vision Screening: Recommended annual ophthalmology exams for early detection of glaucoma and other disorders of the eye. Is the patient up to date with their annual eye exam?  No -coming up Who is the provider or what is the name of the office in which the patient attends annual eye exams? [place] If pt is not established with a provider, would they like to be referred to a provider to establish care? No .   Dental Screening: Recommended annual dental exams for proper oral hygiene  Diabetic Foot Exam:   Community Resource Referral / Chronic Care Management: CRR required this visit?  No   CCM required this visit?  No     Plan:     I have personally reviewed and noted the following in the patient's chart:   Medical and social history Use of alcohol, tobacco or illicit drugs  Current medications and supplements including opioid prescriptions. Patient is not currently taking opioid  prescriptions. Functional ability and status Nutritional status Physical activity Advanced directives List of other physicians Hospitalizations, surgeries, and ER visits in previous 12 months Vitals Screenings to include cognitive, depression, and falls Referrals and appointments  In addition, I have reviewed and discussed with patient certain preventive protocols, quality metrics, and best practice recommendations. A written personalized care plan for preventive services as well as general preventive health recommendations were provided to patient.     Telford Nab, CMA   08/29/2022     Nurse Notes:  Ms. Snarski , Thank you for taking time to come for your Medicare Wellness Visit. I appreciate your ongoing commitment to your health goals. Please review the following plan we discussed and let me know if I can assist you in the future.   These are the goals we discussed:  Goals      Patient Stated     Patient would like to take  more vacations      Patient Stated     Take day by day.        This is a list of the screening recommended for you and due dates:  Health Maintenance  Topic Date Due   COVID-19 Vaccine (3 - 2023-24 season) 09/30/2021   Flu Shot  08/31/2022   DTaP/Tdap/Td vaccine (3 - Td or Tdap) 01/21/2023   Medicare Annual Wellness Visit  08/29/2023   Mammogram  07/30/2024   Colon Cancer Screening  07/09/2032   Pneumonia Vaccine  Completed   DEXA scan (bone density measurement)  Completed   Hepatitis C Screening  Completed   Zoster (Shingles) Vaccine  Completed   HPV Vaccine  Aged Out

## 2022-10-05 ENCOUNTER — Other Ambulatory Visit: Payer: Self-pay | Admitting: Internal Medicine

## 2022-10-05 DIAGNOSIS — K219 Gastro-esophageal reflux disease without esophagitis: Secondary | ICD-10-CM

## 2022-10-30 ENCOUNTER — Other Ambulatory Visit: Payer: Self-pay | Admitting: Internal Medicine

## 2022-10-30 DIAGNOSIS — R600 Localized edema: Secondary | ICD-10-CM

## 2023-01-05 ENCOUNTER — Encounter: Payer: Self-pay | Admitting: Family Medicine

## 2023-01-05 ENCOUNTER — Ambulatory Visit: Payer: Medicare PPO | Admitting: Family Medicine

## 2023-01-05 VITALS — BP 179/71 | HR 85 | Ht 65.0 in | Wt 200.1 lb

## 2023-01-05 DIAGNOSIS — M25512 Pain in left shoulder: Secondary | ICD-10-CM

## 2023-01-05 MED ORDER — KETOROLAC TROMETHAMINE 60 MG/2ML IM SOLN
60.0000 mg | Freq: Once | INTRAMUSCULAR | Status: AC
Start: 2023-01-05 — End: 2023-01-05
  Administered 2023-01-05: 60 mg via INTRAMUSCULAR

## 2023-01-05 MED ORDER — METHYLPREDNISOLONE ACETATE 80 MG/ML IJ SUSP
80.0000 mg | Freq: Once | INTRAMUSCULAR | Status: AC
Start: 2023-01-05 — End: 2023-01-05
  Administered 2023-01-05: 80 mg via INTRAMUSCULAR

## 2023-01-05 NOTE — Progress Notes (Signed)
Established Patient Office Visit  Subjective:  Patient ID: Robin Villa, female    DOB: 14-Dec-1950  Age: 72 y.o. MRN: [id number]  CC:  Chief Complaint  Patient presents with   Shoulder Pain    Pt reports shoulder pain , is swollen and painful when she moves it , ongoing for about a week.     HPI Robin Villa is a 72 y.o. female presents with complaints of left shoulder pain that has persisted for about two weeks. She denies any recent injury or trauma to the left shoulder. The predominant symptoms include swelling and stiffness, with the patient rating the pain as 7 out of 10. She reports that the pain is not radiating. There is no deformity or decreased function of the left shoulder, and she denies any numbness, tingling, or weakness in the left hand. The patient notes that the range of motion is limited, primarily due to joint stiffness.  Past Medical History:  Diagnosis Date   Arthritis    Heart murmur    asymptomatic   Hypercholesteremia    Hypertension     Past Surgical History:  Procedure Laterality Date   CARPAL TUNNEL RELEASE     bilateral    CATARACT EXTRACTION W/PHACO Right 09/07/2014   Procedure: CATARACT EXTRACTION PHACO AND INTRAOCULAR LENS PLACEMENT RIGHT EYE CDE=6.10;  Surgeon: Gemma Payor, MD;  Location: [facility];  Service: Ophthalmology;  Laterality: Right;   COLONOSCOPY  09/15/2011   Procedure: COLONOSCOPY;  Surgeon: West Bali, MD;  Location: [facility];  Service: Endoscopy;  Laterality: N/A;  11:00AM   COLONOSCOPY WITH PROPOFOL N/A 07/10/2022   Procedure: COLONOSCOPY WITH PROPOFOL;  Surgeon: Corbin Ade, MD;  Location: [facility];  Service: Endoscopy;  Laterality: N/A;  1245pm, asa 2   FLEXIBLE SIGMOIDOSCOPY N/A 03/09/2022   Procedure: FLEXIBLE SIGMOIDOSCOPY;  Surgeon: Corbin Ade, MD;  Location: [facility];  Service: Endoscopy;  Laterality: N/A;   POLYPECTOMY  07/10/2022   Procedure: POLYPECTOMY;  Surgeon: Corbin Ade, MD;  Location: [facility];  Service: Endoscopy;;   TOTAL KNEE ARTHROPLASTY Right 04/09/2018   Procedure: TOTAL KNEE ARTHROPLASTY;  Surgeon: Vickki Hearing, MD;  Location: [facility];  Service: Orthopedics;  Laterality: Right;   TUBAL LIGATION      Family History  Problem Relation Age of Onset   Heart disease Father    Stroke Father    Hypertension Sister    Hypertension Sister    Heart disease Brother    Hypertension Mother     Social History   Socioeconomic History   Marital status: Widowed    Spouse name: Not on file   Number of children: 3   Years of education: Not on file   Highest education level: Not on file  Occupational History   Occupation: retired, Photographer: [employer]  Tobacco Use   Smoking status: Former    Current packs/day: 0.00    Average packs/day: 0.3 packs/day for 35.0 years (10.5 ttl pk-yrs)    Types: Cigarettes    Start date: 08/22/1961    Quit date: 08/22/1996    Years since quitting: 26.3   Smokeless tobacco: Never  Vaping Use   Vaping status: Never Used  Substance and Sexual Activity   Alcohol use: No   Drug use: No   Sexual activity: Yes    Birth control/protection: Post-menopausal  Other Topics Concern   Not on file  Social History  Narrative   Not on file   Social Determinants of Health   Financial Resource Strain: Low Risk  (08/26/2021)   Overall Financial Resource Strain (CARDIA)    Difficulty of Paying Living Expenses: Not hard at all  Food Insecurity: No Food Insecurity (08/29/2022)   Hunger Vital Sign    Worried About Running Out of Food in the Last Year: Never true    Ran Out of Food in the Last Year: Never true  Transportation Needs: No Transportation Needs (08/29/2022)   PRAPARE - Administrator, Civil Service (Medical): No    Lack of Transportation (Non-Medical): No  Physical Activity: Sufficiently Active (08/29/2022)   Exercise Vital Sign    Days of Exercise per Week: 7 days    Minutes of Exercise per  Session: 50 min  Stress: No Stress Concern Present (08/29/2022)   [employer] of Occupational Health - Occupational Stress Questionnaire    Feeling of Stress : Not at all  Social Connections: Moderately Isolated (08/29/2022)   Social Connection and Isolation Panel [NHANES]    Frequency of Communication with Friends and Family: More than three times a week    Frequency of Social Gatherings with Friends and Family: Twice a week    Attends Religious Services: More than 4 times per year    Active Member of [employer] or Organizations: No    Attends Banker Meetings: Never    Marital Status: Widowed  Intimate Partner Violence: Not At Risk (08/29/2022)   Humiliation, Afraid, Rape, and Kick questionnaire    Fear of Current or Ex-Partner: No    Emotionally Abused: No    Physically Abused: No    Sexually Abused: No    Outpatient Medications Prior to Visit  Medication Sig Dispense Refill   acetaminophen (TYLENOL) 500 MG tablet Take 1,000 mg by mouth every 6 (six) hours as needed for moderate pain.     amLODipine (NORVASC) 10 MG tablet Take 1 tablet (10 mg total) by mouth daily. 90 tablet 3   aspirin 81 MG tablet Take 1 tablet (81 mg total) by mouth daily. 30 tablet    furosemide (LASIX) 20 MG tablet Take 1 tablet by mouth once daily 90 tablet 0   hydrALAZINE (APRESOLINE) 25 MG tablet Take 1 tablet (25 mg total) by mouth 2 (two) times daily. 180 tablet 3   ibuprofen (ADVIL) 200 MG tablet Take 400 mg by mouth every 6 (six) hours as needed for moderate pain.     Misc. Devices MISC BP cuff/divide - ICD10: I10 1 each 0   ondansetron (ZOFRAN-ODT) 4 MG disintegrating tablet Take 1 tablet (4 mg total) by mouth every 8 (eight) hours as needed for nausea or vomiting. 5 tablet 0   pantoprazole (PROTONIX) 40 MG tablet Take 1 tablet by mouth once daily 90 tablet 0   simvastatin (ZOCOR) 20 MG tablet Take 1 tablet (20 mg total) by mouth at bedtime. 90 tablet 1   telmisartan (MICARDIS) 40 MG tablet  Take 1 tablet (40 mg total) by mouth daily. 90 tablet 3   VITAMIN D PO Take 1 capsule by mouth daily.     No facility-administered medications prior to visit.    No Known Allergies  ROS Review of Systems  Constitutional:  Negative for chills and fever.  Eyes:  Negative for visual disturbance.  Respiratory:  Negative for chest tightness and shortness of breath.   Musculoskeletal:  Positive for arthralgias.  Neurological:  Negative for dizziness and headaches.  Objective:    Physical Exam HENT:     Head: Normocephalic.     Mouth/Throat:     Mouth: Mucous membranes are moist.  Cardiovascular:     Rate and Rhythm: Normal rate.     Heart sounds: Normal heart sounds.  Pulmonary:     Effort: Pulmonary effort is normal.     Breath sounds: Normal breath sounds.  Musculoskeletal:     Comments: Decreased ROM of left shoulder  Neurological:     Mental Status: She is alert.     BP (!) 179/71   Pulse 85   Ht 5\' 5"  (1.651 m)   Wt 200 lb 1.3 oz (90.8 kg)   SpO2 97%   BMI 33.30 kg/m  Wt Readings from Last 3 Encounters:  01/05/23 200 lb 1.3 oz (90.8 kg)  08/29/22 200 lb 9.6 oz (91 kg)  07/10/22 200 lb (90.7 kg)    Lab Results  Component Value Date   TSH 1.520 12/09/2021   Lab Results  Component Value Date   WBC 7.8 12/09/2021   HGB 12.6 12/09/2021   HCT 38.2 12/09/2021   MCV 89 12/09/2021   PLT 240 12/09/2021   Lab Results  Component Value Date   NA 135 07/03/2022   K 4.4 07/03/2022   CO2 20 (L) 07/03/2022   GLUCOSE 106 (H) 07/03/2022   BUN 23 07/03/2022   CREATININE 1.60 (H) 07/03/2022   BILITOT 0.4 12/09/2021   ALKPHOS 72 12/09/2021   AST 18 12/09/2021   ALT 17 12/09/2021   PROT 7.2 12/09/2021   ALBUMIN 4.5 12/09/2021   CALCIUM 9.3 07/03/2022   ANIONGAP 10 07/03/2022   EGFR 30 (L) 04/10/2022   Lab Results  Component Value Date   CHOL 185 12/09/2021   Lab Results  Component Value Date   HDL 35 (L) 12/09/2021   Lab Results  Component Value  Date   LDLCALC 119 (H) 12/09/2021   Lab Results  Component Value Date   TRIG 176 (H) 12/09/2021   Lab Results  Component Value Date   CHOLHDL 5.3 (H) 12/09/2021   Lab Results  Component Value Date   HGBA1C 5.6 12/09/2021      Assessment & Plan:  Acute pain of left shoulder Assessment & Plan: Depo-Medrol and Toradol were administered in the clinic. The patient was advised not to take ibuprofen products for 72 hours following the Toradol injection to avoid potential drug interactions and ensure optimal pain management. She was encouraged to alternate cold and heat therapy at the affected site, perform stretching and strengthening exercises, and follow up if her symptoms worsen or do not improve with the current treatment regimen. The patient verbalized understanding and is aware of the plan of care.   Orders: -     methylPREDNISolone Acetate -     Ketorolac Tromethamine  Note: This chart has been completed using Engineer, civil (consulting) software, and while attempts have been made to ensure accuracy, certain words and phrases may not be transcribed as intended.    Follow-up: No follow-ups on file.   Gilmore Laroche, FNP

## 2023-01-05 NOTE — Patient Instructions (Addendum)
I appreciate the opportunity to provide care to you today!   Shoulder Pain Management Instructions:  Ibuprofen: Please refrain from taking oral ibuprofen for 72 hours following your Toradol injection at the clinic to avoid potential drug interactions and ensure optimal pain management.  Stretching and Strengthening Exercises: I recommend performing the shoulder stretching and strengthening exercises attached to your After Visit Summary (AVS). These exercises will help improve range of motion and support the healing process.  Cold and Heat Therapy: Alternate between ice and heat therapy on the affected shoulder for 10-20 minutes, 4-5 times daily. This will help manage inflammation, reduce pain, and improve blood flow.  Follow-up: Please follow up if you experience worsening of your symptoms or if your symptoms do not improve with the recommended treatments.  Attached with your AVS, you will find valuable resources for self-education. I highly recommend dedicating some time to thoroughly examine them.   Please continue to a heart-healthy diet and increase your physical activities. Try to exercise for at least five days a week.    It was a pleasure to see you and I look forward to continuing to work together on your health and well-being. Please do not hesitate to call the office if you need care or have questions about your care.  In case of emergency, please visit the Emergency Department for urgent care, or contact our clinic at [phone] to schedule an appointment. We're here to help you!   Have a wonderful day and week. With Gratitude, Gilmore Laroche MSN, FNP-BC

## 2023-01-05 NOTE — Assessment & Plan Note (Addendum)
Depo-Medrol and Toradol were administered in the clinic. The patient was advised not to take ibuprofen products for 72 hours following the Toradol injection to avoid potential drug interactions and ensure optimal pain management. She was encouraged to alternate cold and heat therapy at the affected site, perform stretching and strengthening exercises, and follow up if her symptoms worsen or do not improve with the current treatment regimen. The patient verbalized understanding and is aware of the plan of care.

## 2023-01-11 ENCOUNTER — Other Ambulatory Visit: Payer: Self-pay | Admitting: Internal Medicine

## 2023-01-11 DIAGNOSIS — E782 Mixed hyperlipidemia: Secondary | ICD-10-CM

## 2023-01-11 DIAGNOSIS — K219 Gastro-esophageal reflux disease without esophagitis: Secondary | ICD-10-CM

## 2023-01-23 ENCOUNTER — Ambulatory Visit: Payer: Self-pay | Admitting: Internal Medicine

## 2023-01-30 ENCOUNTER — Other Ambulatory Visit: Payer: Self-pay | Admitting: Internal Medicine

## 2023-01-30 DIAGNOSIS — R600 Localized edema: Secondary | ICD-10-CM

## 2023-04-05 ENCOUNTER — Ambulatory Visit: Payer: Medicare PPO | Admitting: Internal Medicine

## 2023-04-05 ENCOUNTER — Encounter: Payer: Self-pay | Admitting: Internal Medicine

## 2023-04-05 VITALS — BP 154/76 | HR 85 | Ht 65.0 in | Wt 192.4 lb

## 2023-04-05 DIAGNOSIS — I10 Essential (primary) hypertension: Secondary | ICD-10-CM | POA: Diagnosis not present

## 2023-04-05 DIAGNOSIS — E782 Mixed hyperlipidemia: Secondary | ICD-10-CM

## 2023-04-05 DIAGNOSIS — I5032 Chronic diastolic (congestive) heart failure: Secondary | ICD-10-CM | POA: Diagnosis not present

## 2023-04-05 DIAGNOSIS — R600 Localized edema: Secondary | ICD-10-CM | POA: Diagnosis not present

## 2023-04-05 DIAGNOSIS — J309 Allergic rhinitis, unspecified: Secondary | ICD-10-CM | POA: Diagnosis not present

## 2023-04-05 DIAGNOSIS — E559 Vitamin D deficiency, unspecified: Secondary | ICD-10-CM | POA: Diagnosis not present

## 2023-04-05 DIAGNOSIS — R7303 Prediabetes: Secondary | ICD-10-CM

## 2023-04-05 DIAGNOSIS — N1832 Chronic kidney disease, stage 3b: Secondary | ICD-10-CM | POA: Diagnosis not present

## 2023-04-05 MED ORDER — AMLODIPINE BESYLATE 10 MG PO TABS
10.0000 mg | ORAL_TABLET | Freq: Every day | ORAL | 3 refills | Status: AC
Start: 2023-04-05 — End: ?

## 2023-04-05 MED ORDER — TELMISARTAN 40 MG PO TABS
40.0000 mg | ORAL_TABLET | Freq: Every day | ORAL | 3 refills | Status: AC
Start: 2023-04-05 — End: ?

## 2023-04-05 MED ORDER — FUROSEMIDE 20 MG PO TABS: 20.0000 mg | ORAL_TABLET | Freq: Every day | ORAL | 1 refills | Status: DC

## 2023-04-05 MED ORDER — HYDRALAZINE HCL 25 MG PO TABS: 25.0000 mg | ORAL_TABLET | Freq: Three times a day (TID) | ORAL | 3 refills | Status: DC

## 2023-04-05 NOTE — Assessment & Plan Note (Addendum)
 BP Readings from Last 1 Encounters:  04/05/23 (!) 154/76   Uncontrolled with Amlodipine 10 mg QD, Telmisartan 40 mg QD and Hydralazine 25 mg BID Increased dose of hydralazine to 25 mg 3 times daily Has underlying CKD Counseled for compliance with the medications Advised DASH diet and moderate exercise/walking as tolerated

## 2023-04-05 NOTE — Assessment & Plan Note (Addendum)
 Last BMP reviewed, CKD stage 3b, stable - needs to improve hydration On Lasix and Telmisartan Check CMP Avoid nephrotoxic agents

## 2023-04-05 NOTE — Assessment & Plan Note (Signed)
Echo showed Grade I diastolic dysfunction Has chronic leg swelling, but no orthopnea or PND Lasix PRN for now

## 2023-04-05 NOTE — Patient Instructions (Signed)
 Please start taking Hydralazine 25 mg 3 times daily instead of twice daily.  Please continue to take medications as prescribed.  Please continue to follow low salt diet and ambulate as tolerated.  Please get fasting blood tests done within a week.

## 2023-04-05 NOTE — Progress Notes (Signed)
 Established Patient Office Visit  Subjective:  Patient ID: Robin Villa, female    DOB: 08/07/1950  Age: 73 y.o. MRN: [id number]  CC:  Chief Complaint  Patient presents with   Ear Fullness    both ears   Hypertension   Chronic Kidney Disease    HPI Robin Villa is a 73 y.o. female with past medical history of HTN, CKD stage 3, GERD, OA of knee and insomnia who presents for f/u of her chronic medical conditions.  HTN: BP is elevated today. Takes medications regularly. Patient denies headache, dizziness, chest pain, or palpitations.  She currently takes amlodipine and hydralazine. She has mild bilateral leg swelling, which is worse in the evening and improves in the morning with leg elevation.  She had Echo done, which showed grade 1 diastolic dysfunction and mild to moderate AR.  She currently denies any orthopnea or PND.  She takes Lasix for leg swelling.  CKD stage III: Her last BMP shows stable GFR in 06/24. Had lost follow up with [place] since 03/24. She denies any dysuria or hematuria.  Denies any urinary hesitance or resistance. She admits that she needs to improve fluid intake.  Allergic sinusitis: She reports chronic nasal congestion and postnasal drip.  She has been using nasal spray (likely Flonase) with mild relief.  She also reports being bilateral ear fullness for the last 1 week.  Denies ear pain or discharge currently.  Denies any fever, chills, dyspnea and wheezing currently.  Past Medical History:  Diagnosis Date   Arthritis    Heart murmur    asymptomatic   Hypercholesteremia    Hypertension     Past Surgical History:  Procedure Laterality Date   CARPAL TUNNEL RELEASE     bilateral    CATARACT EXTRACTION W/PHACO Right 09/07/2014   Procedure: CATARACT EXTRACTION PHACO AND INTRAOCULAR LENS PLACEMENT RIGHT EYE CDE=6.10;  Surgeon: Gemma Payor, MD;  Location: [facility];  Service: Ophthalmology;  Laterality: Right;   COLONOSCOPY  09/15/2011   Procedure: COLONOSCOPY;  Surgeon:  West Bali, MD;  Location: [facility];  Service: Endoscopy;  Laterality: N/A;  11:00AM   COLONOSCOPY WITH PROPOFOL N/A 07/10/2022   Procedure: COLONOSCOPY WITH PROPOFOL;  Surgeon: Corbin Ade, MD;  Location: [facility];  Service: Endoscopy;  Laterality: N/A;  1245pm, asa 2   FLEXIBLE SIGMOIDOSCOPY N/A 03/09/2022   Procedure: FLEXIBLE SIGMOIDOSCOPY;  Surgeon: Corbin Ade, MD;  Location: [facility];  Service: Endoscopy;  Laterality: N/A;   POLYPECTOMY  07/10/2022   Procedure: POLYPECTOMY;  Surgeon: Corbin Ade, MD;  Location: [facility];  Service: Endoscopy;;   TOTAL KNEE ARTHROPLASTY Right 04/09/2018   Procedure: TOTAL KNEE ARTHROPLASTY;  Surgeon: Vickki Hearing, MD;  Location: [facility];  Service: Orthopedics;  Laterality: Right;   TUBAL LIGATION      Family History  Problem Relation Age of Onset   Heart disease Father    Stroke Father    Hypertension Sister    Hypertension Sister    Heart disease Brother    Hypertension Mother     Social History   Socioeconomic History   Marital status: Widowed    Spouse name: Not on file   Number of children: 3   Years of education: Not on file   Highest education level: Not on file  Occupational History   Occupation: retired, Photographer: [employer]  Tobacco Use   Smoking status: Former  Current packs/day: 0.00    Average packs/day: 0.3 packs/day for 35.0 years (10.5 ttl pk-yrs)    Types: Cigarettes    Start date: 08/22/1961    Quit date: 08/22/1996    Years since quitting: 26.6   Smokeless tobacco: Never  Vaping Use   Vaping status: Never Used  Substance and Sexual Activity   Alcohol use: No   Drug use: No   Sexual activity: Yes    Birth control/protection: Post-menopausal  Other Topics Concern   Not on file  Social History Narrative   Not on file   Social Drivers of Health   Financial Resource Strain: Low Risk  (08/26/2021)   Overall Financial Resource Strain (CARDIA)     Difficulty of Paying Living Expenses: Not hard at all  Food Insecurity: No Food Insecurity (08/29/2022)   Hunger Vital Sign    Worried About Running Out of Food in the Last Year: Never true    Ran Out of Food in the Last Year: Never true  Transportation Needs: No Transportation Needs (08/29/2022)   PRAPARE - Administrator, Civil Service (Medical): No    Lack of Transportation (Non-Medical): No  Physical Activity: Sufficiently Active (08/29/2022)   Exercise Vital Sign    Days of Exercise per Week: 7 days    Minutes of Exercise per Session: 50 min  Stress: No Stress Concern Present (08/29/2022)   [employer] of Occupational Health - Occupational Stress Questionnaire    Feeling of Stress : Not at all  Social Connections: Moderately Isolated (08/29/2022)   Social Connection and Isolation Panel [NHANES]    Frequency of Communication with Friends and Family: More than three times a week    Frequency of Social Gatherings with Friends and Family: Twice a week    Attends Religious Services: More than 4 times per year    Active Member of [employer] or Organizations: No    Attends Banker Meetings: Never    Marital Status: Widowed  Intimate Partner Violence: Not At Risk (08/29/2022)   Humiliation, Afraid, Rape, and Kick questionnaire    Fear of Current or Ex-Partner: No    Emotionally Abused: No    Physically Abused: No    Sexually Abused: No    Outpatient Medications Prior to Visit  Medication Sig Dispense Refill   acetaminophen (TYLENOL) 500 MG tablet Take 1,000 mg by mouth every 6 (six) hours as needed for moderate pain.     aspirin 81 MG tablet Take 1 tablet (81 mg total) by mouth daily. 30 tablet    ibuprofen (ADVIL) 200 MG tablet Take 400 mg by mouth every 6 (six) hours as needed for moderate pain.     Misc. Devices MISC BP cuff/divide - ICD10: I10 1 each 0   ondansetron (ZOFRAN-ODT) 4 MG disintegrating tablet Take 1 tablet (4 mg total) by mouth every 8 (eight)  hours as needed for nausea or vomiting. 5 tablet 0   pantoprazole (PROTONIX) 40 MG tablet Take 1 tablet by mouth once daily 90 tablet 0   simvastatin (ZOCOR) 20 MG tablet TAKE 1 TABLET BY MOUTH AT BEDTIME 90 tablet 0   VITAMIN D PO Take 1 capsule by mouth daily.     amLODipine (NORVASC) 10 MG tablet Take 1 tablet (10 mg total) by mouth daily. 90 tablet 3   furosemide (LASIX) 20 MG tablet Take 1 tablet by mouth once daily 90 tablet 0   hydrALAZINE (APRESOLINE) 25 MG tablet Take 1 tablet (25 mg total)  by mouth 2 (two) times daily. 180 tablet 3   telmisartan (MICARDIS) 40 MG tablet Take 1 tablet (40 mg total) by mouth daily. 90 tablet 3   No facility-administered medications prior to visit.    No Known Allergies  ROS Review of Systems  Constitutional:  Negative for chills and fever.  HENT:  Positive for congestion, postnasal drip and sinus pressure. Negative for sore throat.        Bilateral ear fullness  Eyes:  Negative for pain and discharge.  Respiratory:  Negative for cough and wheezing.   Cardiovascular:  Positive for leg swelling. Negative for chest pain and palpitations.  Gastrointestinal:  Negative for abdominal pain, constipation, diarrhea, nausea and vomiting.  Endocrine: Negative for polydipsia and polyuria.  Genitourinary:  Negative for dysuria and hematuria.  Musculoskeletal:  Negative for neck pain and neck stiffness.  Skin:  Negative for rash.  Neurological:  Negative for dizziness and weakness.  Psychiatric/Behavioral:  Negative for agitation and behavioral problems.       Objective:    Physical Exam Vitals reviewed.  Constitutional:      General: She is not in acute distress.    Appearance: She is obese. She is not diaphoretic.  HENT:     Head: Normocephalic and atraumatic.     Right Ear: External ear normal. There is no impacted cerumen.     Left Ear: External ear normal. There is no impacted cerumen.     Nose: Nose normal.     Mouth/Throat:     Mouth:  Mucous membranes are moist.  Eyes:     General: No scleral icterus.    Extraocular Movements: Extraocular movements intact.  Cardiovascular:     Rate and Rhythm: Normal rate and regular rhythm.     Pulses: Normal pulses.     Heart sounds: Normal heart sounds. No murmur heard. Pulmonary:     Breath sounds: Normal breath sounds. No wheezing or rales.  Musculoskeletal:     Cervical back: Neck supple. No tenderness.     Right lower leg: No edema.     Left lower leg: No edema.  Skin:    General: Skin is warm.     Findings: No rash.  Neurological:     General: No focal deficit present.     Mental Status: She is alert and oriented to person, place, and time.     Sensory: No sensory deficit.     Motor: No weakness.  Psychiatric:        Mood and Affect: Mood normal.        Behavior: Behavior normal.     BP (!) 154/76 (BP Location: Left Arm)   Pulse 85   Ht 5\' 5"  (1.651 m)   Wt 192 lb 6.4 oz (87.3 kg)   SpO2 95%   BMI 32.02 kg/m  Wt Readings from Last 3 Encounters:  04/05/23 192 lb 6.4 oz (87.3 kg)  01/05/23 200 lb 1.3 oz (90.8 kg)  08/29/22 200 lb 9.6 oz (91 kg)    Lab Results  Component Value Date   TSH 1.520 12/09/2021   Lab Results  Component Value Date   WBC 7.8 12/09/2021   HGB 12.6 12/09/2021   HCT 38.2 12/09/2021   MCV 89 12/09/2021   PLT 240 12/09/2021   Lab Results  Component Value Date   NA 135 07/03/2022   K 4.4 07/03/2022   CO2 20 (L) 07/03/2022   GLUCOSE 106 (H) 07/03/2022   BUN 23 07/03/2022  CREATININE 1.60 (H) 07/03/2022   BILITOT 0.4 12/09/2021   ALKPHOS 72 12/09/2021   AST 18 12/09/2021   ALT 17 12/09/2021   PROT 7.2 12/09/2021   ALBUMIN 4.5 12/09/2021   CALCIUM 9.3 07/03/2022   ANIONGAP 10 07/03/2022   EGFR 30 (L) 04/10/2022   Lab Results  Component Value Date   CHOL 185 12/09/2021   Lab Results  Component Value Date   HDL 35 (L) 12/09/2021   Lab Results  Component Value Date   LDLCALC 119 (H) 12/09/2021   Lab Results   Component Value Date   TRIG 176 (H) 12/09/2021   Lab Results  Component Value Date   CHOLHDL 5.3 (H) 12/09/2021   Lab Results  Component Value Date   HGBA1C 5.6 12/09/2021      Assessment & Plan:   Problem List Items Addressed This Visit       Cardiovascular and Mediastinum   Essential hypertension - Primary   BP Readings from Last 1 Encounters:  04/05/23 (!) 154/76   Uncontrolled with Amlodipine 10 mg QD, Telmisartan 40 mg QD and Hydralazine 25 mg BID Increased dose of hydralazine to 25 mg 3 times daily Has underlying CKD Counseled for compliance with the medications Advised DASH diet and moderate exercise/walking as tolerated      Relevant Medications   hydrALAZINE (APRESOLINE) 25 MG tablet   amLODipine (NORVASC) 10 MG tablet   telmisartan (MICARDIS) 40 MG tablet   furosemide (LASIX) 20 MG tablet   Other Relevant Orders   TSH   CMP14+EGFR   CBC with Differential/Platelet   Chronic heart failure with preserved ejection fraction (HCC)   Echo showed Grade I diastolic dysfunction Has chronic leg swelling, but no orthopnea or PND Lasix PRN for now      Relevant Medications   hydrALAZINE (APRESOLINE) 25 MG tablet   amLODipine (NORVASC) 10 MG tablet   telmisartan (MICARDIS) 40 MG tablet   furosemide (LASIX) 20 MG tablet     Respiratory   Allergic sinusitis   Continue Flonase Bilateral ear fullness likely due to allergic sinusitis If persistent, can add oral antihistaminic        Genitourinary   CKD (chronic kidney disease) stage 3, GFR 30-59 ml/min (HCC)   Last BMP reviewed, CKD stage 3b, stable - needs to improve hydration On Lasix and Telmisartan Check CMP Avoid nephrotoxic agents      Relevant Orders   CMP14+EGFR   CBC with Differential/Platelet     Other   Hyperlipidemia   On statin Check lipid profile      Relevant Medications   hydrALAZINE (APRESOLINE) 25 MG tablet   amLODipine (NORVASC) 10 MG tablet   telmisartan (MICARDIS) 40 MG  tablet   furosemide (LASIX) 20 MG tablet   Other Relevant Orders   Lipid panel   Vitamin D deficiency   Last vitamin D Lab Results  Component Value Date   VD25OH 15.4 (L) 12/09/2021   Advised to continue Vitamin D 5000 IU QD      Relevant Orders   VITAMIN D 25 Hydroxy (Vit-D Deficiency, Fractures)   Prediabetes   Lab Results  Component Value Date   HGBA1C 5.6 12/09/2021   Advised to follow DASH diet      Relevant Orders   Hemoglobin A1c   Leg edema   Chronic leg swelling Could be due to venous insufficiency, advised leg elevation as tolerated Echo showed HFpEF, has Lasix PRN for leg swelling      Relevant Medications  furosemide (LASIX) 20 MG tablet    Meds ordered this encounter  Medications   hydrALAZINE (APRESOLINE) 25 MG tablet    Sig: Take 1 tablet (25 mg total) by mouth 3 (three) times daily.    Dispense:  270 tablet    Refill:  3   amLODipine (NORVASC) 10 MG tablet    Sig: Take 1 tablet (10 mg total) by mouth daily.    Dispense:  90 tablet    Refill:  3   telmisartan (MICARDIS) 40 MG tablet    Sig: Take 1 tablet (40 mg total) by mouth daily.    Dispense:  90 tablet    Refill:  3   furosemide (LASIX) 20 MG tablet    Sig: Take 1 tablet (20 mg total) by mouth daily.    Dispense:  90 tablet    Refill:  1    Follow-up: Return in about 4 months (around 08/05/2023) for HTN and CKD.    Anabel Halon, MD

## 2023-04-06 DIAGNOSIS — J309 Allergic rhinitis, unspecified: Secondary | ICD-10-CM | POA: Insufficient documentation

## 2023-04-06 NOTE — Assessment & Plan Note (Addendum)
 On statin Check lipid profile

## 2023-04-06 NOTE — Assessment & Plan Note (Signed)
 Last vitamin D Lab Results  Component Value Date   VD25OH 15.4 (L) 12/09/2021   Advised to continue Vitamin D 5000 IU QD

## 2023-04-06 NOTE — Assessment & Plan Note (Signed)
 Lab Results  Component Value Date   HGBA1C 5.6 12/09/2021   Advised to follow DASH diet

## 2023-04-06 NOTE — Assessment & Plan Note (Signed)
 Continue Flonase Bilateral ear fullness likely due to allergic sinusitis If persistent, can add oral antihistaminic

## 2023-04-06 NOTE — Assessment & Plan Note (Signed)
Chronic leg swelling Could be due to venous insufficiency, advised leg elevation as tolerated Echo showed HFpEF, has Lasix PRN for leg swelling

## 2023-04-11 ENCOUNTER — Other Ambulatory Visit: Payer: Self-pay | Admitting: Internal Medicine

## 2023-04-11 DIAGNOSIS — K219 Gastro-esophageal reflux disease without esophagitis: Secondary | ICD-10-CM

## 2023-04-24 ENCOUNTER — Other Ambulatory Visit (HOSPITAL_COMMUNITY)
Admission: RE | Admit: 2023-04-24 | Discharge: 2023-04-24 | Disposition: A | Discharge status: Home / Self Care | Source: Ambulatory Visit | Attending: Nephrology | Admitting: Nephrology

## 2023-04-24 DIAGNOSIS — N189 Chronic kidney disease, unspecified: Secondary | ICD-10-CM | POA: Insufficient documentation

## 2023-04-24 DIAGNOSIS — D631 Anemia in chronic kidney disease: Secondary | ICD-10-CM | POA: Diagnosis not present

## 2023-04-24 DIAGNOSIS — R809 Proteinuria, unspecified: Secondary | ICD-10-CM | POA: Diagnosis not present

## 2023-04-24 LAB — CBC
HCT: 39.4 % (ref 36.0–46.0)
Hemoglobin: 12.9 g/dL (ref 12.0–15.0)
MCH: 29.5 pg (ref 26.0–34.0)
MCHC: 32.7 g/dL (ref 30.0–36.0)
MCV: 90 fL (ref 80.0–100.0)
Platelets: 294 10*3/uL (ref 150–400)
RBC: 4.38 MIL/uL (ref 3.87–5.11)
RDW: 13.7 % (ref 11.5–15.5)
WBC: 8.9 10*3/uL (ref 4.0–10.5)
nRBC: 0 % (ref 0.0–0.2)

## 2023-04-24 LAB — PROTEIN / CREATININE RATIO, URINE
Creatinine, Urine: 203 mg/dL
Protein Creatinine Ratio: 0.06 mg/mg{creat} (ref 0.00–0.15)
Total Protein, Urine: 13 mg/dL

## 2023-04-24 LAB — RENAL FUNCTION PANEL
Albumin: 3.9 g/dL (ref 3.5–5.0)
Anion gap: 11 (ref 5–15)
BUN: 17 mg/dL (ref 8–23)
CO2: 20 mmol/L — ABNORMAL LOW (ref 22–32)
Calcium: 9.5 mg/dL (ref 8.9–10.3)
Chloride: 108 mmol/L (ref 98–111)
Creatinine, Ser: 1.28 mg/dL — ABNORMAL HIGH (ref 0.44–1.00)
GFR, Estimated: 44 mL/min — ABNORMAL LOW (ref >60)
Glucose, Bld: 121 mg/dL — ABNORMAL HIGH (ref 70–99)
Phosphorus: 2.7 mg/dL (ref 2.5–4.6)
Potassium: 4.3 mmol/L (ref 3.5–5.1)
Sodium: 139 mmol/L (ref 135–145)

## 2023-04-26 ENCOUNTER — Other Ambulatory Visit: Payer: Self-pay | Admitting: Internal Medicine

## 2023-04-26 DIAGNOSIS — I10 Essential (primary) hypertension: Secondary | ICD-10-CM

## 2023-04-30 ENCOUNTER — Ambulatory Visit: Payer: Medicare PPO | Admitting: Orthopedic Surgery

## 2023-05-29 DIAGNOSIS — N1832 Chronic kidney disease, stage 3b: Secondary | ICD-10-CM | POA: Diagnosis not present

## 2023-05-29 DIAGNOSIS — I10 Essential (primary) hypertension: Secondary | ICD-10-CM | POA: Diagnosis not present

## 2023-06-12 ENCOUNTER — Other Ambulatory Visit: Payer: Self-pay | Admitting: Internal Medicine

## 2023-06-12 DIAGNOSIS — E782 Mixed hyperlipidemia: Secondary | ICD-10-CM

## 2023-06-21 IMAGING — MG MM DIGITAL SCREENING BILAT W/ TOMO AND CAD
8 series · 8 of 24 positions shown · non-contrast
Comparison: Previous exam(s).

CLINICAL DATA: Screening.

EXAM:
DIGITAL SCREENING BILATERAL MAMMOGRAM WITH TOMOSYNTHESIS AND CAD
TECHNIQUE: Bilateral screening digital craniocaudal and mediolateral oblique
mammograms were obtained. Bilateral screening digital breast
tomosynthesis was performed. The images were evaluated with
computer-aided detection.

[L CC synth-2D]
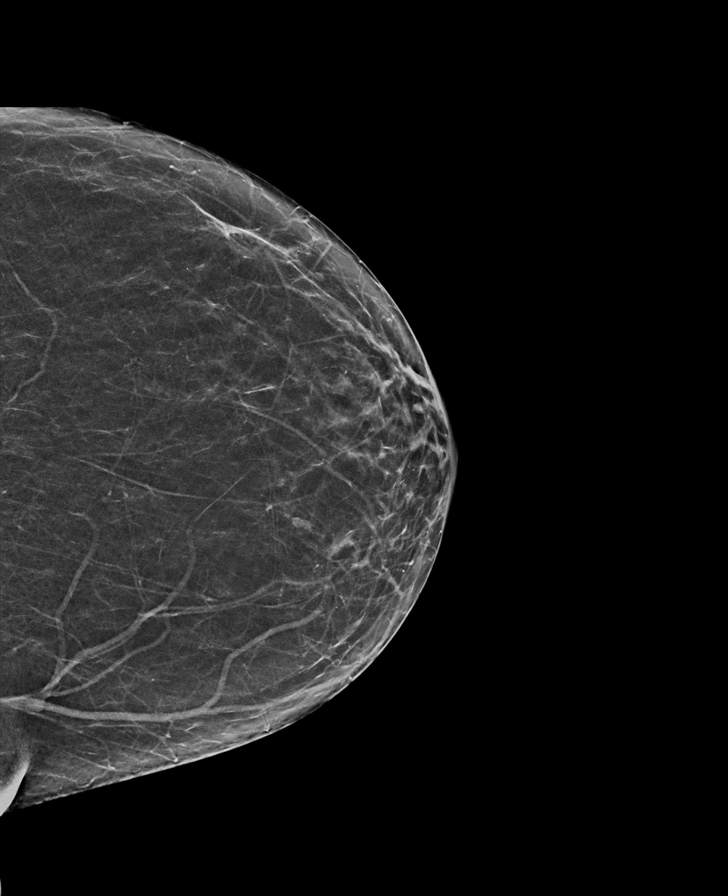

[L MLO synth-2D]
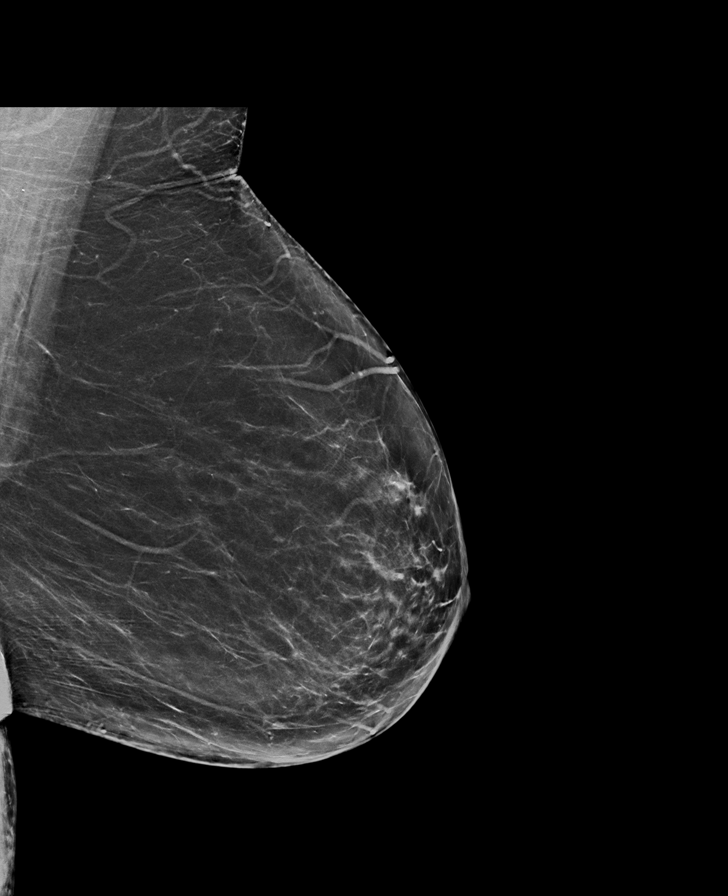

[R MLO synth-2D]
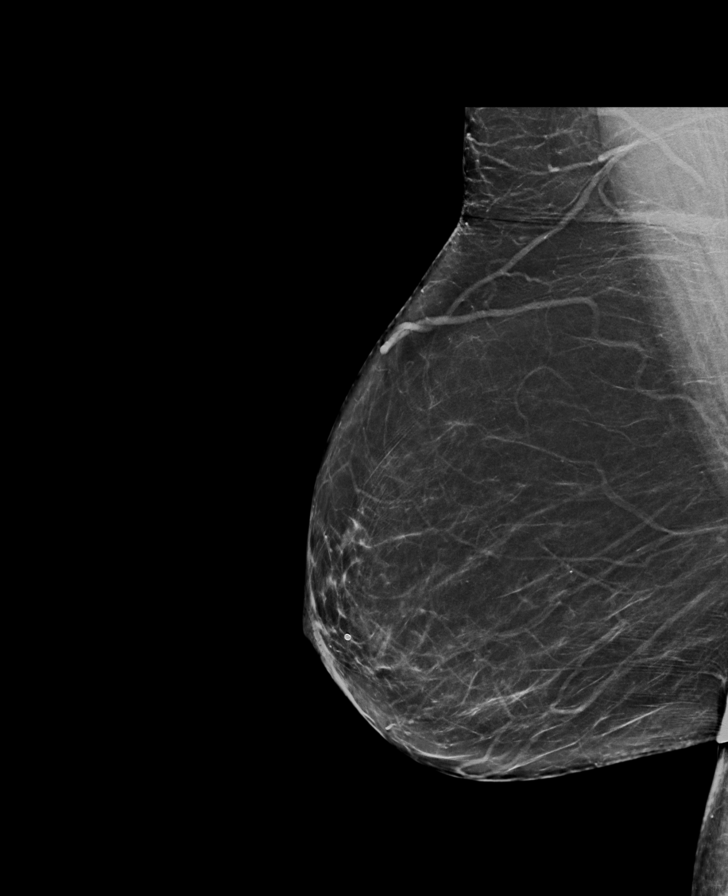

[R CC synth-2D]
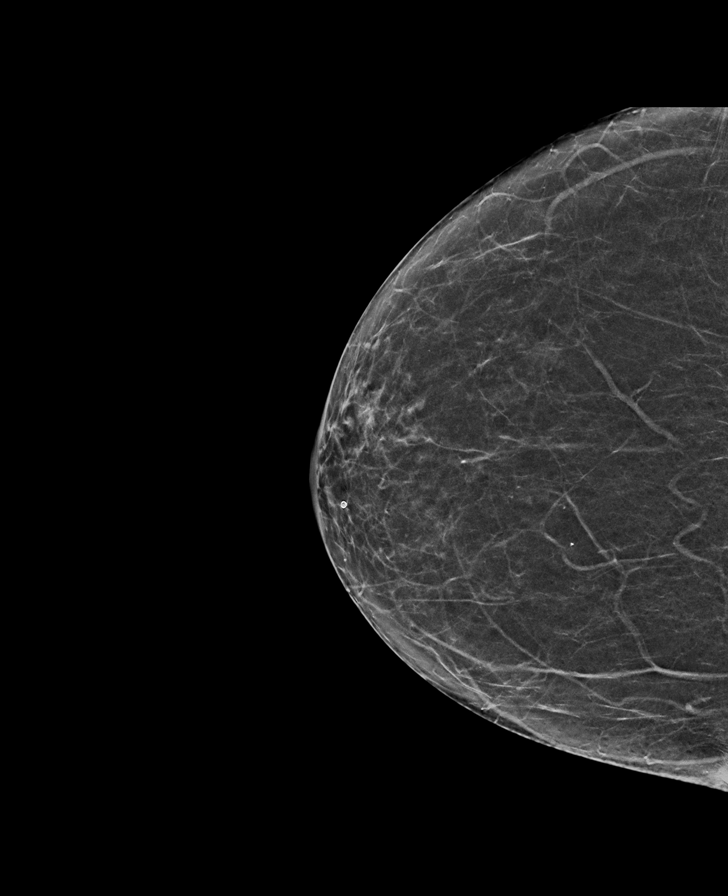

[L CC tomo · tomo slice 30/59.0]
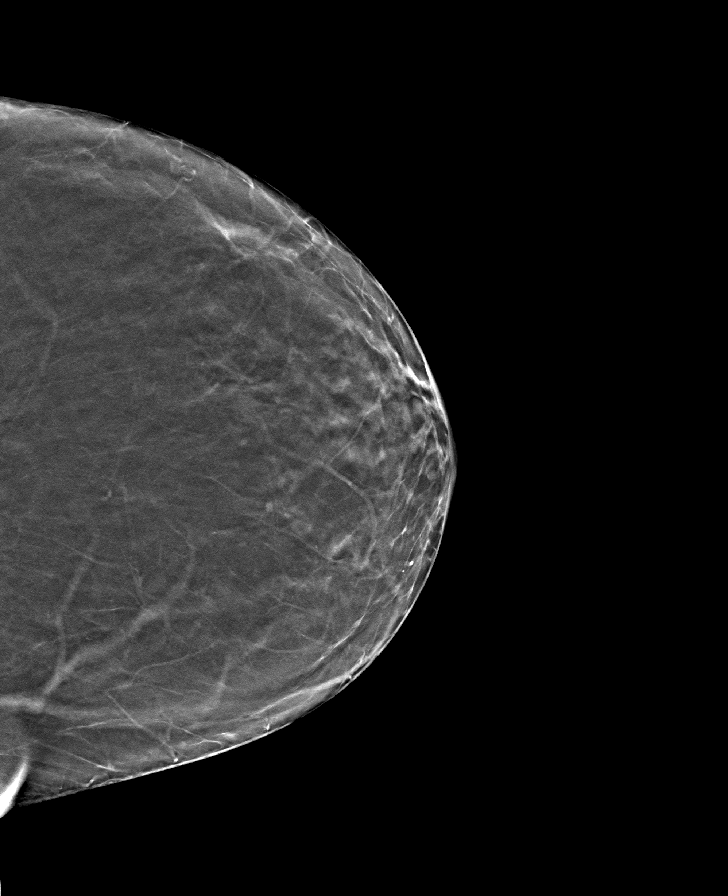

[L MLO tomo · tomo slice 37/74.0]
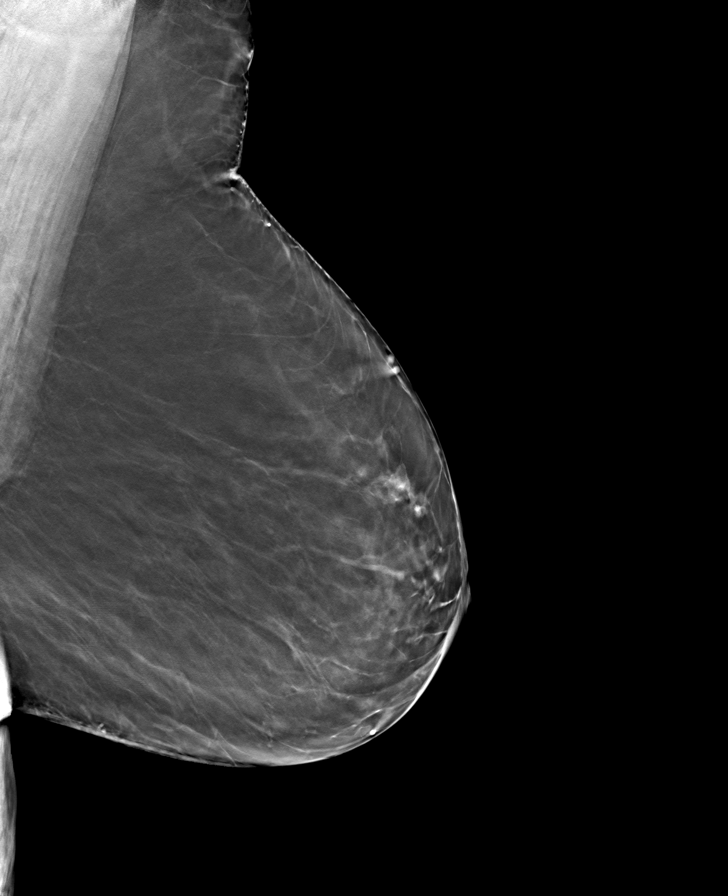

[R CC tomo · tomo slice 29/57.0]
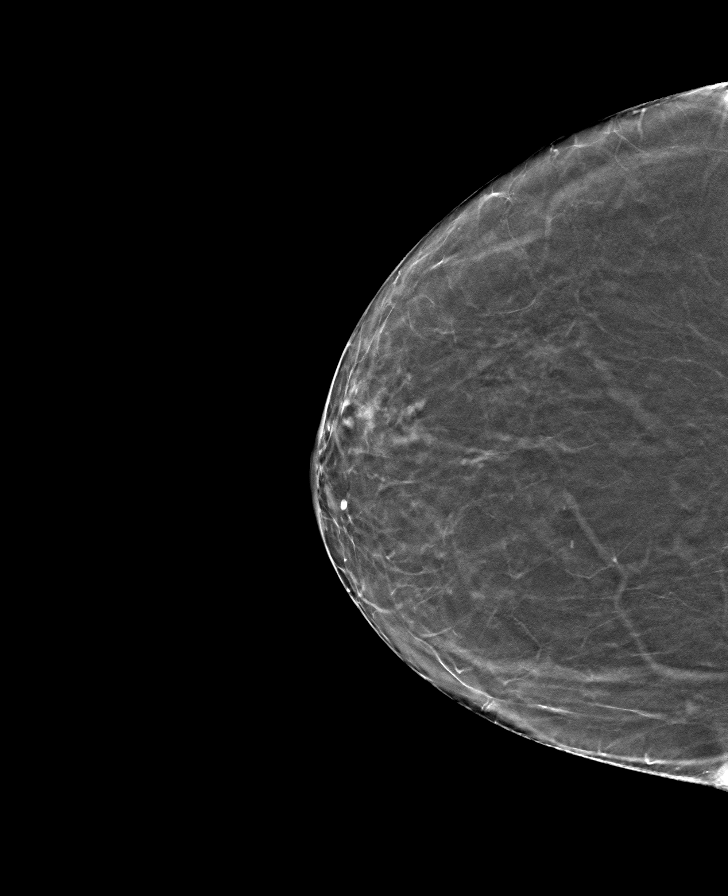

[R MLO tomo · tomo slice 37/74.0]
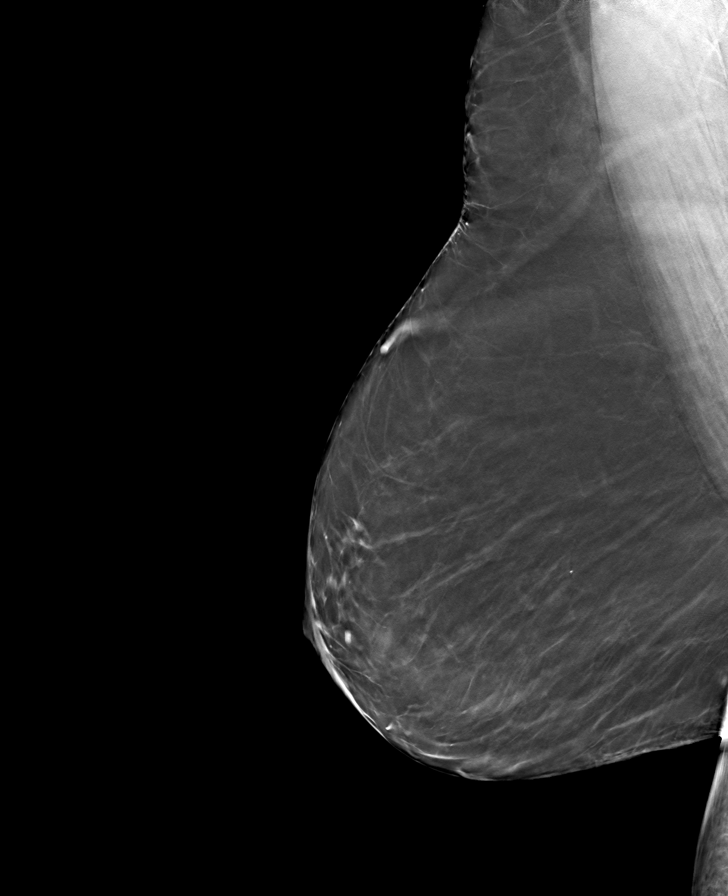

[8 of 24 positions shown; findings below may reference images not displayed]

ACR Breast Density Category b: There are scattered areas of
fibroglandular density.
FINDINGS: There are no findings suspicious for malignancy.
IMPRESSION: No mammographic evidence of malignancy. A result letter of this
screening mammogram will be mailed directly to the patient.

RECOMMENDATION:
Screening mammogram in one year. (Code:51-O-LD2)

BI-RADS CATEGORY  1: Negative.

## 2023-07-06 ENCOUNTER — Other Ambulatory Visit: Payer: Self-pay | Admitting: Internal Medicine

## 2023-07-06 DIAGNOSIS — K219 Gastro-esophageal reflux disease without esophagitis: Secondary | ICD-10-CM

## 2023-08-08 ENCOUNTER — Ambulatory Visit: Admitting: Internal Medicine

## 2023-08-21 ENCOUNTER — Ambulatory Visit: Admitting: Internal Medicine

## 2023-08-21 ENCOUNTER — Encounter: Payer: Self-pay | Admitting: Internal Medicine

## 2023-08-21 VITALS — BP 136/69 | HR 87 | Ht 66.0 in | Wt 195.8 lb

## 2023-08-21 DIAGNOSIS — E782 Mixed hyperlipidemia: Secondary | ICD-10-CM | POA: Diagnosis not present

## 2023-08-21 DIAGNOSIS — I5032 Chronic diastolic (congestive) heart failure: Secondary | ICD-10-CM

## 2023-08-21 DIAGNOSIS — K219 Gastro-esophageal reflux disease without esophagitis: Secondary | ICD-10-CM | POA: Diagnosis not present

## 2023-08-21 DIAGNOSIS — Z636 Dependent relative needing care at home: Secondary | ICD-10-CM | POA: Insufficient documentation

## 2023-08-21 DIAGNOSIS — E559 Vitamin D deficiency, unspecified: Secondary | ICD-10-CM

## 2023-08-21 DIAGNOSIS — R7303 Prediabetes: Secondary | ICD-10-CM

## 2023-08-21 DIAGNOSIS — Z780 Asymptomatic menopausal state: Secondary | ICD-10-CM

## 2023-08-21 DIAGNOSIS — I10 Essential (primary) hypertension: Secondary | ICD-10-CM | POA: Diagnosis not present

## 2023-08-21 DIAGNOSIS — N1832 Chronic kidney disease, stage 3b: Secondary | ICD-10-CM | POA: Diagnosis not present

## 2023-08-21 DIAGNOSIS — Z0001 Encounter for general adult medical examination with abnormal findings: Secondary | ICD-10-CM | POA: Diagnosis not present

## 2023-08-21 NOTE — Assessment & Plan Note (Signed)
Echo showed Grade I diastolic dysfunction Has chronic leg swelling, but no orthopnea or PND Lasix PRN for now

## 2023-08-21 NOTE — Patient Instructions (Addendum)
 Please contact [facility] for additional help with care for your sister.  [address] [place], [place] [address] Phone: [phone]  Please schedule DEXA scan.  Please continue to take medications as prescribed.  Please continue to follow low salt diet and perform moderate exercise/walking as tolerated.  Please consider getting Tdap vaccine at local pharmacy.

## 2023-08-21 NOTE — Assessment & Plan Note (Signed)
 BP Readings from Last 1 Encounters:  08/21/23 136/69   Well-controlled with Amlodipine  10 mg QD, Telmisartan  40 mg QD and Hydralazine  25 mg TID Has underlying CKD Counseled for compliance with the medications Advised DASH diet and moderate exercise/walking as tolerated

## 2023-08-21 NOTE — Assessment & Plan Note (Signed)
 Last BMP reviewed, CKD stage 3b, stable - needs to improve hydration On Lasix and Telmisartan Check CMP Avoid nephrotoxic agents

## 2023-08-21 NOTE — Progress Notes (Unsigned)
 Established Patient Office Visit  Subjective:  Patient ID: Robin Villa, female    DOB: 10-Jan-1951  Age: 73 y.o. MRN: [id number]  CC:  Chief Complaint  Patient presents with   Medical Management of Chronic Issues    Following up. Reports caring for sister that has dementia/alzheimer and has been feeling wore out herself from this.     HPI Robin Villa is a 73 y.o. female with past medical history of HTN, CKD stage 3, GERD, OA of knee and insomnia who presents for f/u of her chronic medical conditions.  HTN: BP is WNL today. Takes medications regularly. Patient denies headache, dizziness, chest pain, or palpitations.  She currently takes amlodipine  and hydralazine . She has mild bilateral leg swelling, which is worse in the evening and improves in the morning with leg elevation.  She had Echo done, which showed grade 1 diastolic dysfunction and mild to moderate AR.  She currently denies any orthopnea or PND.  She takes Lasix  for leg swelling.  CKD stage III: Her last BMP shows stable GFR in 06/24. Had lost follow up with us  since 03/24. She denies any dysuria or hematuria.  Denies any urinary hesitance or resistance. She admits that she needs to improve fluid intake.  Allergic sinusitis: She reports chronic nasal congestion and postnasal drip.  She has been using nasal spray (likely Flonase) with mild relief.  She also reports being bilateral ear fullness for the last 1 week.  Denies ear pain or discharge currently.  Denies any fever, chills, dyspnea and wheezing currently.  Past Medical History:  Diagnosis Date   Arthritis    Heart murmur    asymptomatic   Hypercholesteremia    Hypertension     Past Surgical History:  Procedure Laterality Date   CARPAL TUNNEL RELEASE     bilateral    CATARACT EXTRACTION W/PHACO Right 09/07/2014   Procedure: CATARACT EXTRACTION PHACO AND INTRAOCULAR LENS PLACEMENT RIGHT EYE CDE=6.10;  Surgeon: Cherene Mania, MD;  Location: [facility];  Service: Ophthalmology;   Laterality: Right;   COLONOSCOPY  09/15/2011   Procedure: COLONOSCOPY;  Surgeon: Margo LITTIE Haddock, MD;  Location: [facility];  Service: Endoscopy;  Laterality: N/A;  11:00AM   COLONOSCOPY WITH PROPOFOL  N/A 07/10/2022   Procedure: COLONOSCOPY WITH PROPOFOL ;  Surgeon: Shaaron Lamar HERO, MD;  Location: [facility];  Service: Endoscopy;  Laterality: N/A;  1245pm, asa 2   FLEXIBLE SIGMOIDOSCOPY N/A 03/09/2022   Procedure: FLEXIBLE SIGMOIDOSCOPY;  Surgeon: Shaaron Lamar HERO, MD;  Location: [facility];  Service: Endoscopy;  Laterality: N/A;   POLYPECTOMY  07/10/2022   Procedure: POLYPECTOMY;  Surgeon: Shaaron Lamar HERO, MD;  Location: [facility];  Service: Endoscopy;;   TOTAL KNEE ARTHROPLASTY Right 04/09/2018   Procedure: TOTAL KNEE ARTHROPLASTY;  Surgeon: Margrette Taft BRAVO, MD;  Location: [facility];  Service: Orthopedics;  Laterality: Right;   TUBAL LIGATION      Family History  Problem Relation Age of Onset   Heart disease Father    Stroke Father    Hypertension Sister    Hypertension Sister    Heart disease Brother    Hypertension Mother     Social History   Socioeconomic History   Marital status: Widowed    Spouse name: Not on file   Number of children: 3   Years of education: Not on file   Highest education level: Not on file  Occupational History   Occupation: retired, Photographer:  [employer]  Tobacco Use   Smoking status: Former    Current packs/day: 0.00    Average packs/day: 0.3 packs/day for 35.0 years (10.5 ttl pk-yrs)    Types: Cigarettes    Start date: 08/22/1961    Quit date: 08/22/1996    Years since quitting: 27.0   Smokeless tobacco: Never  Vaping Use   Vaping status: Never Used  Substance and Sexual Activity   Alcohol use: No   Drug use: No   Sexual activity: Yes    Birth control/protection: Post-menopausal  Other Topics Concern   Not on file  Social History Narrative   Not on file   Social Drivers of Health   Financial Resource  Strain: Low Risk  (08/26/2021)   Overall Financial Resource Strain (CARDIA)    Difficulty of Paying Living Expenses: Not hard at all  Food Insecurity: No Food Insecurity (08/29/2022)   Hunger Vital Sign    Worried About Running Out of Food in the Last Year: Never true    Ran Out of Food in the Last Year: Never true  Transportation Needs: No Transportation Needs (08/29/2022)   PRAPARE - Administrator, Civil Service (Medical): No    Lack of Transportation (Non-Medical): No  Physical Activity: Sufficiently Active (08/29/2022)   Exercise Vital Sign    Days of Exercise per Week: 7 days    Minutes of Exercise per Session: 50 min  Stress: No Stress Concern Present (08/29/2022)   [employer] of Occupational Health - Occupational Stress Questionnaire    Feeling of Stress : Not at all  Social Connections: Moderately Isolated (08/29/2022)   Social Connection and Isolation Panel    Frequency of Communication with Friends and Family: More than three times a week    Frequency of Social Gatherings with Friends and Family: Twice a week    Attends Religious Services: More than 4 times per year    Active Member of [employer] or Organizations: No    Attends Banker Meetings: Never    Marital Status: Widowed  Intimate Partner Violence: Not At Risk (08/29/2022)   Humiliation, Afraid, Rape, and Kick questionnaire    Fear of Current or Ex-Partner: No    Emotionally Abused: No    Physically Abused: No    Sexually Abused: No    Outpatient Medications Prior to Visit  Medication Sig Dispense Refill   acetaminophen  (TYLENOL ) 500 MG tablet Take 1,000 mg by mouth every 6 (six) hours as needed for moderate pain.     amLODipine  (NORVASC ) 10 MG tablet Take 1 tablet (10 mg total) by mouth daily. 90 tablet 3   aspirin  81 MG tablet Take 1 tablet (81 mg total) by mouth daily. 30 tablet    furosemide  (LASIX ) 20 MG tablet Take 1 tablet (20 mg total) by mouth daily. 90 tablet 1   hydrALAZINE   (APRESOLINE ) 25 MG tablet Take 1 tablet by mouth twice daily 180 tablet 0   ibuprofen (ADVIL) 200 MG tablet Take 400 mg by mouth every 6 (six) hours as needed for moderate pain.     Misc. Devices MISC BP cuff/divide - ICD10: I10 1 each 0   ondansetron  (ZOFRAN -ODT) 4 MG disintegrating tablet Take 1 tablet (4 mg total) by mouth every 8 (eight) hours as needed for nausea or vomiting. 5 tablet 0   pantoprazole  (PROTONIX ) 40 MG tablet Take 1 tablet by mouth once daily 90 tablet 0   simvastatin  (ZOCOR ) 20 MG tablet TAKE 1 TABLET BY  MOUTH AT BEDTIME 90 tablet 0   telmisartan  (MICARDIS ) 40 MG tablet Take 1 tablet (40 mg total) by mouth daily. 90 tablet 3   VITAMIN D  PO Take 1 capsule by mouth daily.     No facility-administered medications prior to visit.    No Known Allergies  ROS Review of Systems  Constitutional:  Negative for chills and fever.  HENT:  Positive for congestion, postnasal drip and sinus pressure. Negative for sore throat.        Bilateral ear fullness  Eyes:  Negative for pain and discharge.  Respiratory:  Negative for cough and wheezing.   Cardiovascular:  Positive for leg swelling. Negative for chest pain and palpitations.  Gastrointestinal:  Negative for abdominal pain, constipation, diarrhea, nausea and vomiting.  Endocrine: Negative for polydipsia and polyuria.  Genitourinary:  Negative for dysuria and hematuria.  Musculoskeletal:  Negative for neck pain and neck stiffness.  Skin:  Negative for rash.  Neurological:  Negative for dizziness and weakness.  Psychiatric/Behavioral:  Negative for agitation and behavioral problems.       Objective:    Physical Exam Vitals reviewed.  Constitutional:      General: She is not in acute distress.    Appearance: She is obese. She is not diaphoretic.  HENT:     Head: Normocephalic and atraumatic.     Right Ear: External ear normal. There is no impacted cerumen.     Left Ear: External ear normal. There is no impacted  cerumen.     Nose: Nose normal.     Mouth/Throat:     Mouth: Mucous membranes are moist.  Eyes:     General: No scleral icterus.    Extraocular Movements: Extraocular movements intact.  Cardiovascular:     Rate and Rhythm: Normal rate and regular rhythm.     Pulses: Normal pulses.     Heart sounds: Normal heart sounds. No murmur heard. Pulmonary:     Breath sounds: Normal breath sounds. No wheezing or rales.  Musculoskeletal:     Cervical back: Neck supple. No tenderness.     Right lower leg: No edema.     Left lower leg: No edema.  Skin:    General: Skin is warm.     Findings: No rash.  Neurological:     General: No focal deficit present.     Mental Status: She is alert and oriented to person, place, and time.     Sensory: No sensory deficit.     Motor: No weakness.  Psychiatric:        Mood and Affect: Mood normal.        Behavior: Behavior normal.     BP 136/69   Pulse 87   Ht 5' 6 (1.676 m)   Wt 195 lb 12.8 oz (88.8 kg)   SpO2 93%   BMI 31.60 kg/m  Wt Readings from Last 3 Encounters:  08/21/23 195 lb 12.8 oz (88.8 kg)  04/05/23 192 lb 6.4 oz (87.3 kg)  01/05/23 200 lb 1.3 oz (90.8 kg)    Lab Results  Component Value Date   TSH 1.520 12/09/2021   Lab Results  Component Value Date   WBC 8.9 04/24/2023   HGB 12.9 04/24/2023   HCT 39.4 04/24/2023   MCV 90.0 04/24/2023   PLT 294 04/24/2023   Lab Results  Component Value Date   NA 139 04/24/2023   K 4.3 04/24/2023   CO2 20 (L) 04/24/2023   GLUCOSE 121 (H) 04/24/2023  BUN 17 04/24/2023   CREATININE 1.28 (H) 04/24/2023   BILITOT 0.4 12/09/2021   ALKPHOS 72 12/09/2021   AST 18 12/09/2021   ALT 17 12/09/2021   PROT 7.2 12/09/2021   ALBUMIN 3.9 04/24/2023   CALCIUM  9.5 04/24/2023   ANIONGAP 11 04/24/2023   EGFR 30 (L) 04/10/2022   Lab Results  Component Value Date   CHOL 185 12/09/2021   Lab Results  Component Value Date   HDL 35 (L) 12/09/2021   Lab Results  Component Value Date    LDLCALC 119 (H) 12/09/2021   Lab Results  Component Value Date   TRIG 176 (H) 12/09/2021   Lab Results  Component Value Date   CHOLHDL 5.3 (H) 12/09/2021   Lab Results  Component Value Date   HGBA1C 5.6 12/09/2021      Assessment & Plan:   Problem List Items Addressed This Visit   None    No orders of the defined types were placed in this encounter.   Follow-up: No follow-ups on file.    Suzzane MARLA Blanch, MD

## 2023-08-21 NOTE — Assessment & Plan Note (Signed)
 On statin Check lipid profile

## 2023-08-21 NOTE — Assessment & Plan Note (Signed)
 Well controlled with pantoprazole 40 mg QD

## 2023-08-21 NOTE — Assessment & Plan Note (Signed)
 Physical exam as documented. Counseling done  re healthy lifestyle involving commitment to 150 minutes exercise per week, heart healthy diet, and attaining healthy weight.The importance of adequate sleep also discussed. Immunization and cancer screening needs are specifically addressed at this visit.

## 2023-08-24 NOTE — Assessment & Plan Note (Signed)
 Lab Results  Component Value Date   HGBA1C 5.6 12/09/2021   Advised to follow DASH diet

## 2023-08-24 NOTE — Assessment & Plan Note (Signed)
 Last vitamin D Lab Results  Component Value Date   VD25OH 15.4 (L) 12/09/2021   Advised to continue Vitamin D 5000 IU QD

## 2023-08-24 NOTE — Assessment & Plan Note (Signed)
 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

## 2023-08-31 ENCOUNTER — Other Ambulatory Visit (HOSPITAL_COMMUNITY)

## 2023-09-04 DIAGNOSIS — R7303 Prediabetes: Secondary | ICD-10-CM | POA: Diagnosis not present

## 2023-09-04 DIAGNOSIS — N1832 Chronic kidney disease, stage 3b: Secondary | ICD-10-CM | POA: Diagnosis not present

## 2023-09-04 DIAGNOSIS — E782 Mixed hyperlipidemia: Secondary | ICD-10-CM | POA: Diagnosis not present

## 2023-09-04 DIAGNOSIS — I10 Essential (primary) hypertension: Secondary | ICD-10-CM | POA: Diagnosis not present

## 2023-09-04 DIAGNOSIS — E559 Vitamin D deficiency, unspecified: Secondary | ICD-10-CM | POA: Diagnosis not present

## 2023-09-05 ENCOUNTER — Telehealth: Payer: Self-pay

## 2023-09-05 ENCOUNTER — Ambulatory Visit: Payer: Self-pay | Admitting: Internal Medicine

## 2023-09-05 NOTE — Telephone Encounter (Signed)
 Copied from CRM #[id number]. Topic: Clinical - Lab/Test Results >> Sep 05, 2023  8:36 AM Elle L wrote: Reason for CRM: The patient returned the call regarding her lab results. I read the note verbatim and she expressed understanding.

## 2023-09-06 LAB — CMP14+EGFR
ALT: 23 IU/L (ref 0–32)
AST: 24 IU/L (ref 0–40)
Albumin: 4.3 g/dL (ref 3.8–4.8)
Alkaline Phosphatase: 80 IU/L (ref 44–121)
BUN/Creatinine Ratio: 14 (ref 12–28)
BUN: 21 mg/dL (ref 8–27)
Bilirubin Total: 0.3 mg/dL (ref 0.0–1.2)
CO2: 21 mmol/L (ref 20–29)
Calcium: 9.7 mg/dL (ref 8.7–10.3)
Chloride: 106 mmol/L (ref 96–106)
Creatinine, Ser: 1.54 mg/dL — ABNORMAL HIGH (ref 0.57–1.00)
Globulin, Total: 2.9 g/dL (ref 1.5–4.5)
Glucose: 85 mg/dL (ref 70–99)
Potassium: 5 mmol/L (ref 3.5–5.2)
Sodium: 142 mmol/L (ref 134–144)
Total Protein: 7.2 g/dL (ref 6.0–8.5)
eGFR: 35 mL/min/1.73 — ABNORMAL LOW (ref >59)

## 2023-09-06 LAB — TSH: TSH: 1.78 u[IU]/mL (ref 0.450–4.500)

## 2023-09-06 LAB — CBC WITH DIFFERENTIAL/PLATELET
Basophils Absolute: 0 x10E3/uL (ref 0.0–0.2)
Basos: 1 %
EOS (ABSOLUTE): 0.2 x10E3/uL (ref 0.0–0.4)
Eos: 2 %
Hematocrit: 37.7 % (ref 34.0–46.6)
Hemoglobin: 12.1 g/dL (ref 11.1–15.9)
Immature Grans (Abs): 0 x10E3/uL (ref 0.0–0.1)
Immature Granulocytes: 0 %
Lymphocytes Absolute: 1.5 x10E3/uL (ref 0.7–3.1)
Lymphs: 19 %
MCH: 28.5 pg (ref 26.6–33.0)
MCHC: 32.1 g/dL (ref 31.5–35.7)
MCV: 89 fL (ref 79–97)
Monocytes Absolute: 0.7 x10E3/uL (ref 0.1–0.9)
Monocytes: 9 %
Neutrophils Absolute: 5.3 x10E3/uL (ref 1.4–7.0)
Neutrophils: 69 %
Platelets: 271 x10E3/uL (ref 150–450)
RBC: 4.24 x10E6/uL (ref 3.77–5.28)
RDW: 13.3 % (ref 11.7–15.4)
WBC: 7.7 x10E3/uL (ref 3.4–10.8)

## 2023-09-06 LAB — PARATHYROID HORMONE, INTACT (NO CA): PTH: 46 pg/mL (ref 15–65)

## 2023-09-06 LAB — LIPID PANEL
Chol/HDL Ratio: 4.1 ratio (ref 0.0–4.4)
Cholesterol, Total: 164 mg/dL (ref 100–199)
HDL: 40 mg/dL (ref >39)
LDL Chol Calc (NIH): 100 mg/dL — ABNORMAL HIGH (ref 0–99)
Triglycerides: 132 mg/dL (ref 0–149)
VLDL Cholesterol Cal: 24 mg/dL (ref 5–40)

## 2023-09-06 LAB — HEMOGLOBIN A1C
Est. average glucose Bld gHb Est-mCnc: 120 mg/dL
Hgb A1c MFr Bld: 5.8 % — ABNORMAL HIGH (ref 4.8–5.6)

## 2023-09-06 LAB — VITAMIN D 25 HYDROXY (VIT D DEFICIENCY, FRACTURES): Vit D, 25-Hydroxy: 14 ng/mL — ABNORMAL LOW (ref 30.0–100.0)

## 2023-09-11 DIAGNOSIS — H25812 Combined forms of age-related cataract, left eye: Secondary | ICD-10-CM | POA: Diagnosis not present

## 2023-10-03 ENCOUNTER — Other Ambulatory Visit: Payer: Self-pay | Admitting: Internal Medicine

## 2023-10-03 DIAGNOSIS — K219 Gastro-esophageal reflux disease without esophagitis: Secondary | ICD-10-CM

## 2023-10-16 DIAGNOSIS — H2512 Age-related nuclear cataract, left eye: Secondary | ICD-10-CM | POA: Diagnosis not present

## 2023-10-16 DIAGNOSIS — H43813 Vitreous degeneration, bilateral: Secondary | ICD-10-CM | POA: Diagnosis not present

## 2023-10-16 DIAGNOSIS — Z961 Presence of intraocular lens: Secondary | ICD-10-CM | POA: Diagnosis not present

## 2023-10-20 ENCOUNTER — Other Ambulatory Visit: Payer: Self-pay | Admitting: Internal Medicine

## 2023-10-20 DIAGNOSIS — R600 Localized edema: Secondary | ICD-10-CM

## 2023-11-13 ENCOUNTER — Other Ambulatory Visit (HOSPITAL_COMMUNITY)
Admission: RE | Admit: 2023-11-13 | Discharge: 2023-11-13 | Disposition: A | Discharge status: Home / Self Care | Source: Ambulatory Visit | Attending: Nephrology | Admitting: Nephrology

## 2023-11-13 DIAGNOSIS — N1832 Chronic kidney disease, stage 3b: Secondary | ICD-10-CM | POA: Diagnosis not present

## 2023-11-13 DIAGNOSIS — I10 Essential (primary) hypertension: Secondary | ICD-10-CM | POA: Insufficient documentation

## 2023-11-13 LAB — CBC WITH DIFFERENTIAL/PLATELET
Abs Immature Granulocytes: 0.02 K/uL (ref 0.00–0.07)
Basophils Absolute: 0.1 K/uL (ref 0.0–0.1)
Basophils Relative: 1 %
Eosinophils Absolute: 0.1 K/uL (ref 0.0–0.5)
Eosinophils Relative: 1 %
HCT: 37.9 % (ref 36.0–46.0)
Hemoglobin: 12.2 g/dL (ref 12.0–15.0)
Immature Granulocytes: 0 %
Lymphocytes Relative: 14 %
Lymphs Abs: 1.2 K/uL (ref 0.7–4.0)
MCH: 29.3 pg (ref 26.0–34.0)
MCHC: 32.2 g/dL (ref 30.0–36.0)
MCV: 91.1 fL (ref 80.0–100.0)
Monocytes Absolute: 0.9 K/uL (ref 0.1–1.0)
Monocytes Relative: 11 %
Neutro Abs: 6.1 K/uL (ref 1.7–7.7)
Neutrophils Relative %: 73 %
Platelets: 266 K/uL (ref 150–400)
RBC: 4.16 MIL/uL (ref 3.87–5.11)
RDW: 13.6 % (ref 11.5–15.5)
WBC: 8.3 K/uL (ref 4.0–10.5)
nRBC: 0 % (ref 0.0–0.2)

## 2023-11-13 LAB — RENAL FUNCTION PANEL
Albumin: 4.2 g/dL (ref 3.5–5.0)
Anion gap: 11 (ref 5–15)
BUN: 20 mg/dL (ref 8–23)
CO2: 24 mmol/L (ref 22–32)
Calcium: 9.9 mg/dL (ref 8.9–10.3)
Chloride: 105 mmol/L (ref 98–111)
Creatinine, Ser: 1.45 mg/dL — ABNORMAL HIGH (ref 0.44–1.00)
GFR, Estimated: 37 mL/min — ABNORMAL LOW (ref >60)
Glucose, Bld: 113 mg/dL — ABNORMAL HIGH (ref 70–99)
Phosphorus: 3 mg/dL (ref 2.5–4.6)
Potassium: 4.7 mmol/L (ref 3.5–5.1)
Sodium: 140 mmol/L (ref 135–145)

## 2023-11-14 LAB — PARATHYROID HORMONE, INTACT (NO CA): PTH: 50 pg/mL (ref 15–65)

## 2023-11-14 LAB — MICROALBUMIN / CREATININE URINE RATIO
Creatinine, Urine: 115.9 mg/dL
Microalb Creat Ratio: 5 mg/g{creat} (ref 0–29)
Microalb, Ur: 5.4 ug/mL — ABNORMAL HIGH

## 2023-11-16 ENCOUNTER — Other Ambulatory Visit: Payer: Self-pay | Admitting: Internal Medicine

## 2023-11-16 DIAGNOSIS — E782 Mixed hyperlipidemia: Secondary | ICD-10-CM

## 2023-12-07 IMAGING — US US RENAL
1 series · 14 of 25 positions shown · non-contrast
Comparison: None.

CLINICAL DATA: Chronic kidney disease.

EXAM:
RENAL / URINARY TRACT ULTRASOUND COMPLETE

[Series 1: us renal · 14 of 46 slices shown]
[im 1/46]
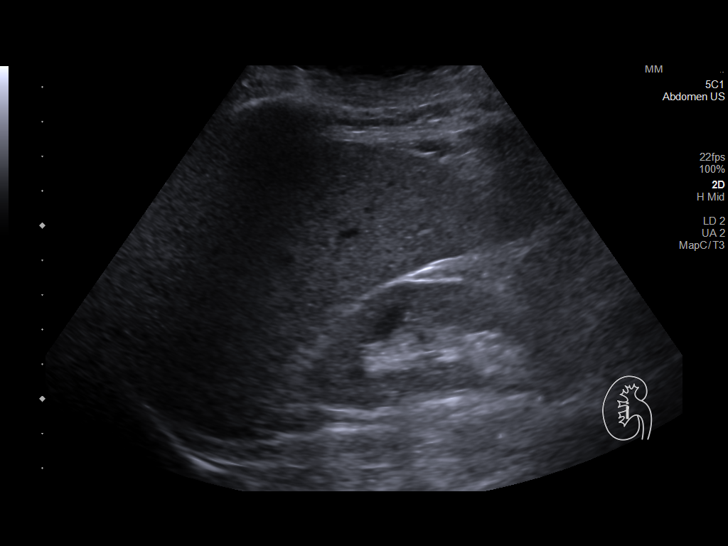
[im 4/46]
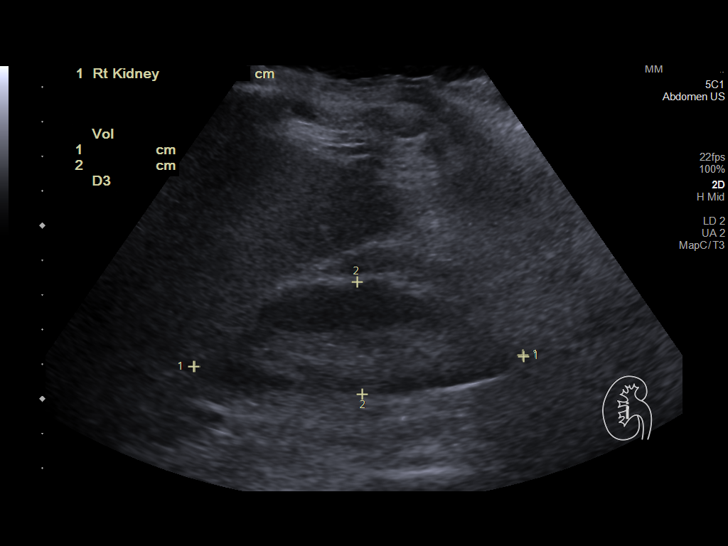
[im 8/46]
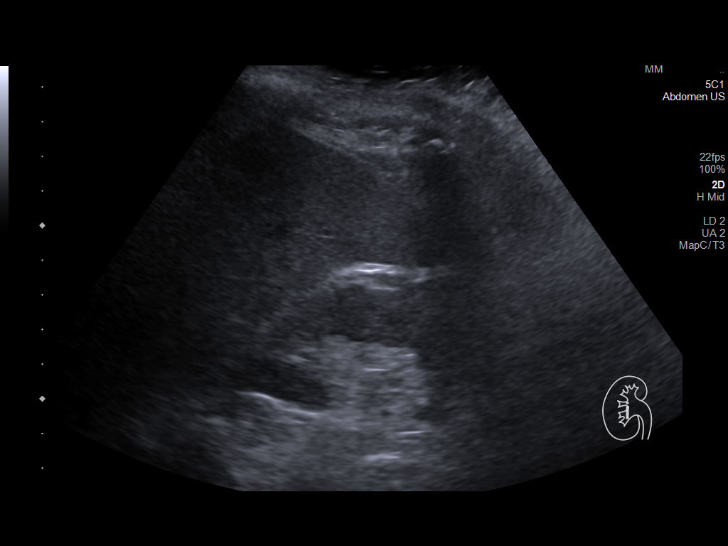
[im 12/46]
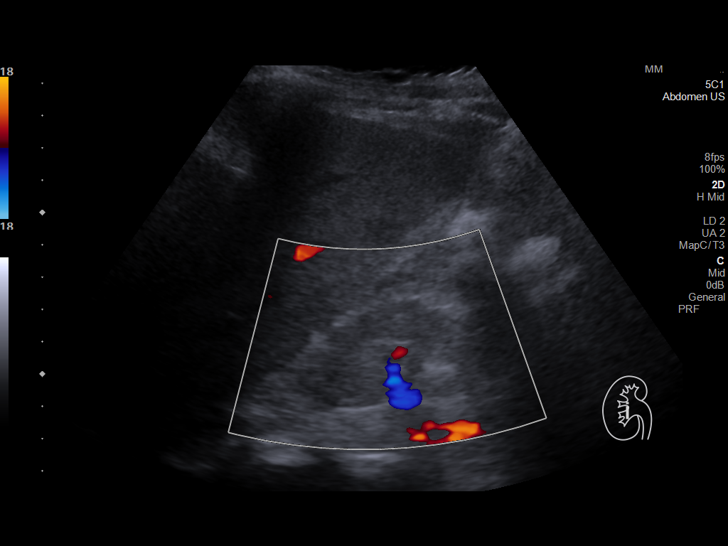
[im 16/46]
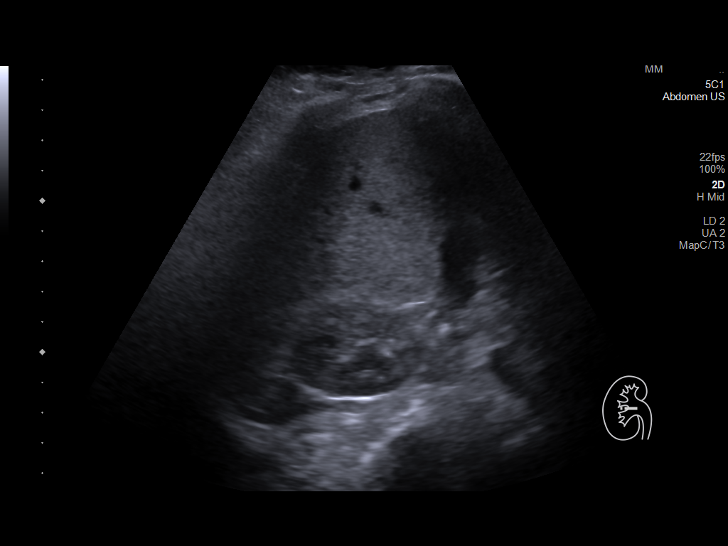
[im 17/46]
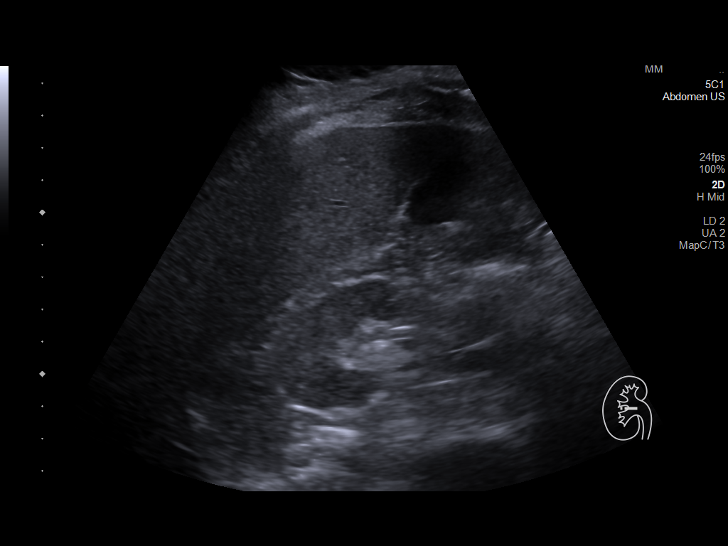
[im 21/46]
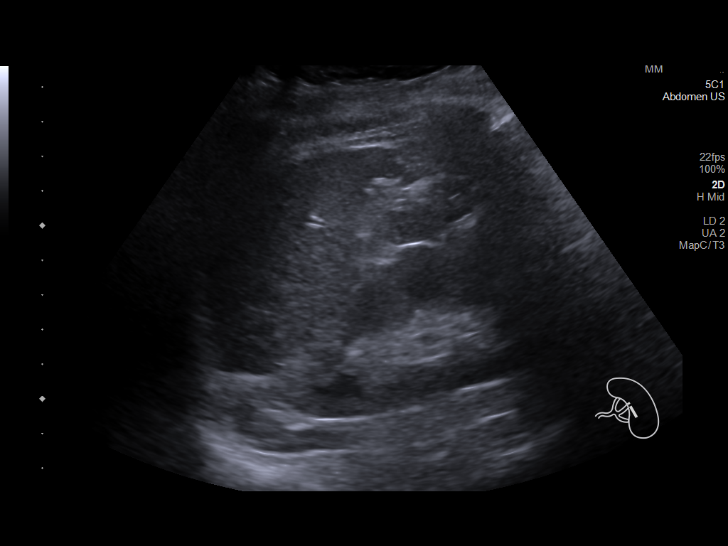
[im 25/46]
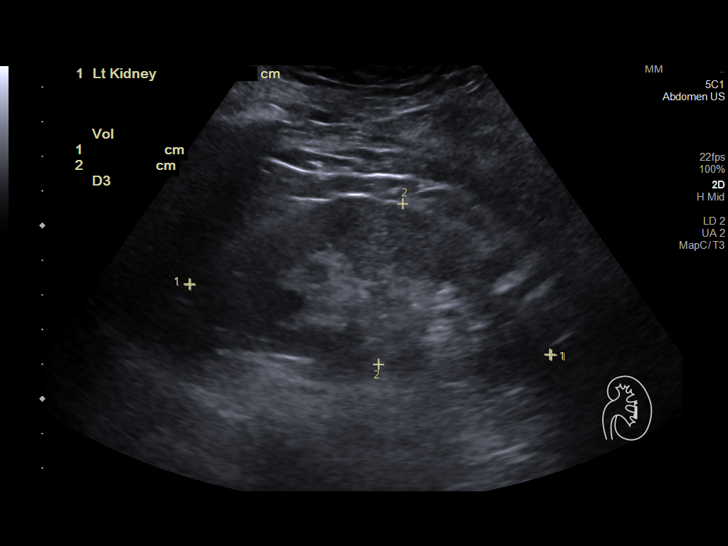
[im 29/46]
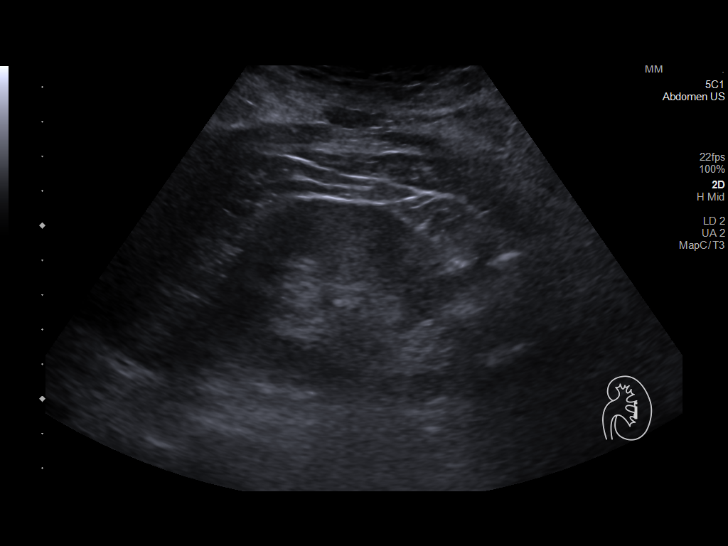
[im 31/46]
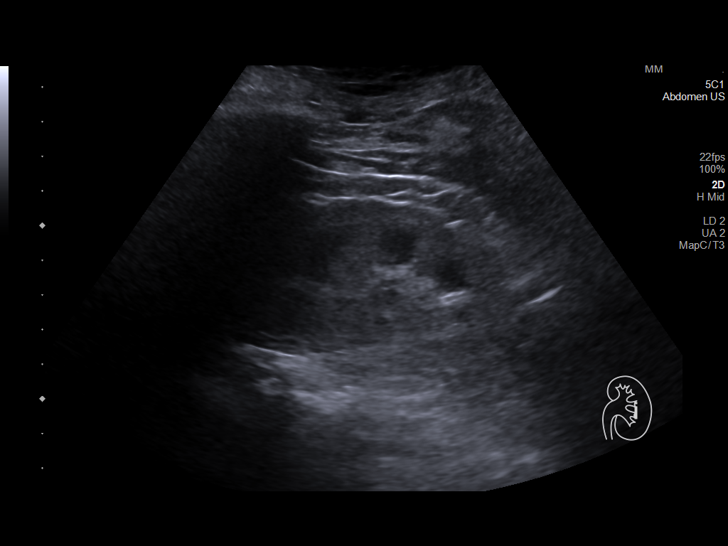
[im 34/46]
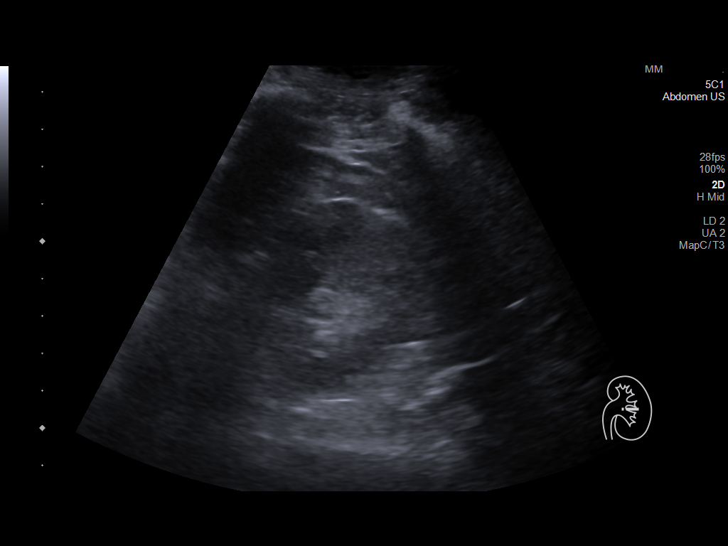
[im 38/46]
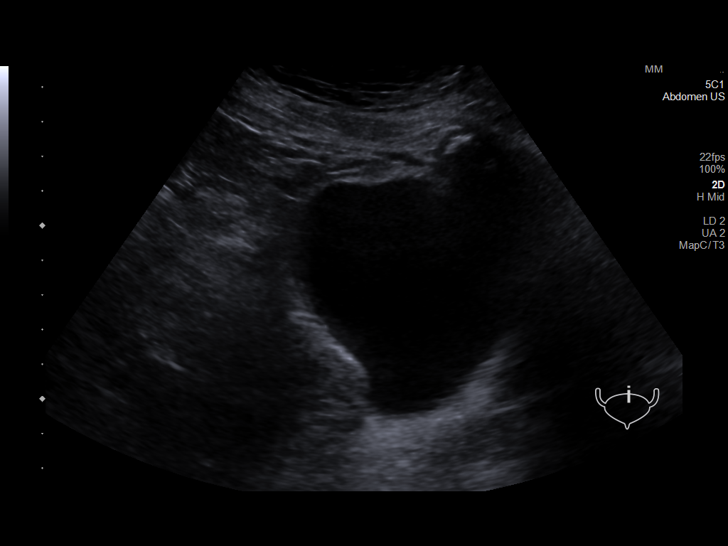
[im 42/46]
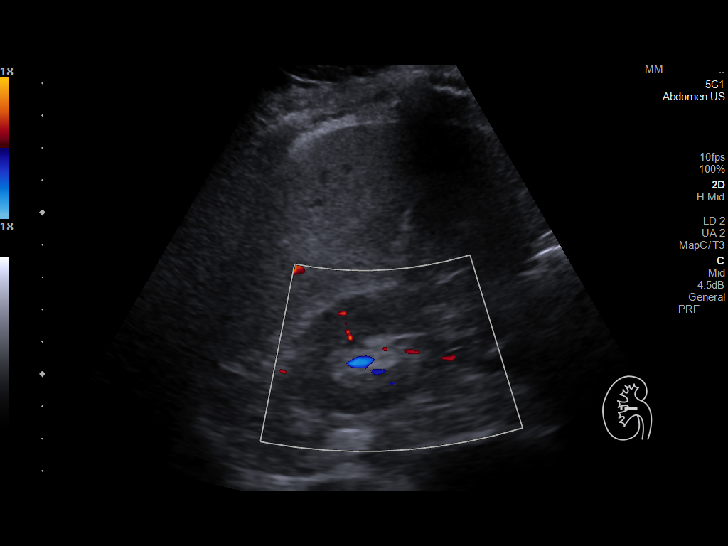
[im 46/46]
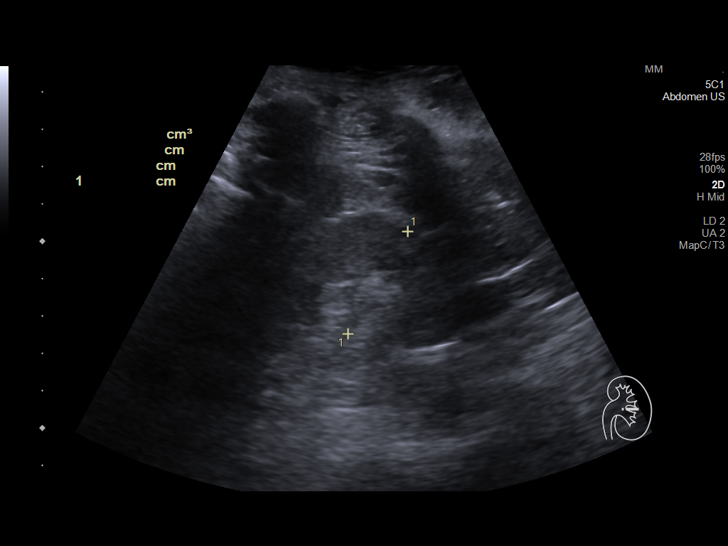

[14 of 25 positions shown; findings below may reference images not displayed]

FINDINGS: Right Kidney:

Renal measurements: 9.5 x 3.3 x 4.2 cm = volume: 68 mL. Echogenicity
and cortical thickness within normal limits. No mass, stone or
hydronephrosis visualized.

Left Kidney:

Renal measurements: 10.6 x 4.7 x 3.2 cm = volume: 83 mL.
Echogenicity and cortical thickness within normal limits. No mass,
stone or hydronephrosis visualized.

Bladder:

Appears normal for degree of bladder distention. Ureteral jets were
not evaluated for.

Other:

No free fluid is seen.
IMPRESSION: Negative renal ultrasound, with the visualized bladder unremarkable.

## 2024-01-02 ENCOUNTER — Other Ambulatory Visit: Payer: Self-pay | Admitting: Internal Medicine

## 2024-01-02 DIAGNOSIS — K219 Gastro-esophageal reflux disease without esophagitis: Secondary | ICD-10-CM

## 2024-01-10 ENCOUNTER — Other Ambulatory Visit: Payer: Self-pay

## 2024-01-10 ENCOUNTER — Ambulatory Visit: Admitting: Orthopedic Surgery

## 2024-01-10 ENCOUNTER — Encounter: Payer: Self-pay | Admitting: Orthopedic Surgery

## 2024-01-10 VITALS — BP 182/86 | HR 88 | Ht 66.0 in | Wt 195.0 lb

## 2024-01-10 DIAGNOSIS — M25562 Pain in left knee: Secondary | ICD-10-CM

## 2024-01-10 DIAGNOSIS — M1712 Unilateral primary osteoarthritis, left knee: Secondary | ICD-10-CM

## 2024-01-10 DIAGNOSIS — G8929 Other chronic pain: Secondary | ICD-10-CM

## 2024-01-10 NOTE — Progress Notes (Signed)
° °  Patient: Robin Villa           Date of Birth: 12-10-1950           MRN: [id number] Visit Date: 01/10/2024 Requested by: Tobie Suzzane POUR, MD [address] [place],  [place] [address] PCP: Tobie Suzzane POUR, MD  Encounter Diagnoses  Name Primary?   Acute pain of left knee Yes   Primary osteoarthritis of left knee     Assessment and plan:  Encounter Diagnoses  Name Primary?   Acute pain of left knee Yes   Primary osteoarthritis of left knee    The patient is not amenable to total knee arthroplasty at this time.  She is taking care of her invalid sister.  I think we can treat her with bracing to help the knee from slipping in and out of place.  She is not having a lot of pain at this time  Arthroscopic surgery would not help in this situation only knee replacement  Return as needed  Recommend economy hinged brace  No orders of the defined types were placed in this encounter.    Chief Complaint  Patient presents with   Knee Pain    Left / feels like it pops out of joint then back     History:  73 year old female status post right total knee arthroplasty with good result presents with complaints of popping and popping out of joint left knee especially when she gets up from a seated position.  She has pain then but otherwise does well with walking and taking care of her sister who has dementia    Focused exam findings:  Peripheral edema noted in both legs it is pitting slight skin discoloration  Left knee less than 5 degree flexion contracture knee flexion goes from 5 degrees up to 110 degrees there is crepitation The joint lines do not seem tender or painful there is a small effusion Quadriceps function is normal skin is intact  DG Knee AP/LAT W/Sunrise Left Result Date: 01/10/2024 Diagnostic views left knee knee slipping X-ray shows a valgus knee prior right total knee Bone spurs are seen in the lateral compartment femur and tibia the tibial spines of peaked subchondral  sclerosis is seen on the lateral side Osteophytes posteriorly on the femur Patellofemoral joint no subluxation or tilt but bone spurs and joint irregularity Impression osteoarthritis grade 3-4

## 2024-01-10 NOTE — Patient Instructions (Signed)
 Wear the economy hinged brace that should help in a slipping sensation you feel in the knee  To fix the problem would require knee replacement

## 2024-01-10 NOTE — Progress Notes (Signed)
°  Intake history:  Chief Complaint  Patient presents with   Knee Pain    Left / feels like it pops out of joint then back      BP (!) 182/86   Pulse 88   Ht 5' 6 (1.676 m)   Wt 195 lb (88.5 kg)   BMI 31.47 kg/m  Body mass index is 31.47 kg/m.  Pharmacy? ________[facility]______________________________  WHAT ARE WE SEEING YOU FOR TODAY?   Left knee  How long has this bothered you? (DOI?DOS?WS?)  Couple months  Was there an injury? No  Anticoag.  No   Any ALLERGIES __________NKDA  ____________________________________   Treatment:  Have you taken:  Tylenol  Yes  Advil Yes  Had PT No  Had injection No  Other  _________________________

## 2024-01-22 ENCOUNTER — Other Ambulatory Visit: Payer: Self-pay | Admitting: Internal Medicine

## 2024-01-22 DIAGNOSIS — R600 Localized edema: Secondary | ICD-10-CM

## 2024-02-13 ENCOUNTER — Other Ambulatory Visit (HOSPITAL_COMMUNITY): Payer: Self-pay | Admitting: Internal Medicine

## 2024-02-13 DIAGNOSIS — Z1231 Encounter for screening mammogram for malignant neoplasm of breast: Secondary | ICD-10-CM

## 2024-02-20 ENCOUNTER — Ambulatory Visit: Admitting: Internal Medicine

## 2024-02-22 ENCOUNTER — Ambulatory Visit (HOSPITAL_COMMUNITY)
Admission: RE | Admit: 2024-02-22 | Discharge: 2024-02-22 | Disposition: A | Source: Ambulatory Visit | Attending: Internal Medicine

## 2024-02-22 DIAGNOSIS — Z1231 Encounter for screening mammogram for malignant neoplasm of breast: Secondary | ICD-10-CM | POA: Insufficient documentation

## 2024-03-25 ENCOUNTER — Ambulatory Visit: Admitting: Internal Medicine

## 2024-03-31 ENCOUNTER — Ambulatory Visit: Admitting: Internal Medicine
